# Patient Record
Sex: Male | Born: 1950
Health system: Southern US, Community
[De-identification: ages and names within clinical notes are randomized; demographics above are authoritative.]

## PROBLEM LIST (undated history)

## (undated) DIAGNOSIS — M1990 Unspecified osteoarthritis, unspecified site: Secondary | ICD-10-CM

## (undated) DIAGNOSIS — J309 Allergic rhinitis, unspecified: Secondary | ICD-10-CM

## (undated) DIAGNOSIS — R0602 Shortness of breath: Secondary | ICD-10-CM

## (undated) DIAGNOSIS — E785 Hyperlipidemia, unspecified: Secondary | ICD-10-CM

## (undated) DIAGNOSIS — R079 Chest pain, unspecified: Secondary | ICD-10-CM

## (undated) DIAGNOSIS — I10 Essential (primary) hypertension: Secondary | ICD-10-CM

## (undated) DIAGNOSIS — J189 Pneumonia, unspecified organism: Secondary | ICD-10-CM

## (undated) DIAGNOSIS — J449 Chronic obstructive pulmonary disease, unspecified: Secondary | ICD-10-CM

## (undated) DIAGNOSIS — N529 Male erectile dysfunction, unspecified: Secondary | ICD-10-CM

## (undated) DIAGNOSIS — D649 Anemia, unspecified: Secondary | ICD-10-CM

## (undated) HISTORY — DX: Shortness of breath: R06.02

## (undated) HISTORY — DX: Hyperlipidemia, unspecified: E78.5

## (undated) HISTORY — DX: Allergic rhinitis, unspecified: J30.9

## (undated) HISTORY — DX: Male erectile dysfunction, unspecified: N52.9

## (undated) HISTORY — DX: Chest pain, unspecified: R07.9

## (undated) HISTORY — DX: Essential (primary) hypertension: I10

---

## 1997-10-14 ENCOUNTER — Ambulatory Visit (HOSPITAL_COMMUNITY): Admission: RE | Admit: 1997-10-14 | Discharge: 1997-10-14 | Payer: Self-pay | Admitting: Family Medicine

## 1997-12-02 ENCOUNTER — Ambulatory Visit (HOSPITAL_COMMUNITY): Admission: RE | Admit: 1997-12-02 | Discharge: 1997-12-02 | Payer: Self-pay | Admitting: Family Medicine

## 2000-06-11 ENCOUNTER — Encounter: Payer: Self-pay | Admitting: Family Medicine

## 2000-06-11 ENCOUNTER — Ambulatory Visit (HOSPITAL_COMMUNITY): Admission: RE | Admit: 2000-06-11 | Discharge: 2000-06-11 | Payer: Self-pay | Admitting: Family Medicine

## 2013-08-30 ENCOUNTER — Ambulatory Visit: Payer: BC Managed Care – PPO

## 2013-08-30 ENCOUNTER — Ambulatory Visit (INDEPENDENT_AMBULATORY_CARE_PROVIDER_SITE_OTHER): Payer: BC Managed Care – PPO | Admitting: Internal Medicine

## 2013-08-30 VITALS — BP 130/86 | HR 74 | Temp 98.1°F | Resp 20 | Ht 74.0 in | Wt 208.2 lb

## 2013-08-30 DIAGNOSIS — R05 Cough: Secondary | ICD-10-CM

## 2013-08-30 DIAGNOSIS — R059 Cough, unspecified: Secondary | ICD-10-CM

## 2013-08-30 DIAGNOSIS — J209 Acute bronchitis, unspecified: Secondary | ICD-10-CM

## 2013-08-30 DIAGNOSIS — F17200 Nicotine dependence, unspecified, uncomplicated: Secondary | ICD-10-CM

## 2013-08-30 MED ORDER — ALBUTEROL SULFATE HFA 108 (90 BASE) MCG/ACT IN AERS: 2.0000 | INHALATION_SPRAY | Freq: Four times a day (QID) | RESPIRATORY_TRACT | Status: DC | PRN

## 2013-08-30 MED ORDER — PREDNISONE 50 MG PO TABS: ORAL_TABLET | ORAL | Status: AC

## 2013-08-30 MED ORDER — AMOXICILLIN-POT CLAVULANATE 875-125 MG PO TABS: 1.0000 | ORAL_TABLET | Freq: Two times a day (BID) | ORAL | Status: DC

## 2013-08-30 MED ORDER — ALBUTEROL SULFATE (2.5 MG/3ML) 0.083% IN NEBU
2.5000 mg | INHALATION_SOLUTION | Freq: Once | RESPIRATORY_TRACT | Status: AC
  Administered 2013-08-30: 2.5 mg via RESPIRATORY_TRACT

## 2013-08-30 MED ORDER — BENZONATATE 200 MG PO CAPS: 200.0000 mg | ORAL_CAPSULE | Freq: Two times a day (BID) | ORAL | Status: DC | PRN

## 2013-08-30 NOTE — Progress Notes (Signed)
   Subjective:    Patient ID: Kenneth Weber, male    DOB: [birth year], 63 y.o.   MRN: [id number]  HPI 63 year old man with cc of cough for the past 2 weeks. cough moderate in severity keeps him up at night.  Sputum yellow, no fever , no chest pain no shortness of breath but does have some wheezing.  Has been taking otc allergy med mucinex and delsym without relief.  Is a smoker but has cut down on the amount he is smoking since he has this cough.  Last chest xray was about 1 year ago. No fever no weight loss no night sweats, no one else at home is sick no recent travel. No hx cardiac dx asthma or emphysema does have htn on meds   Review of Systems  Constitutional: Negative for fever, chills, diaphoresis, activity change, appetite change and fatigue.  HENT: Positive for congestion. Negative for rhinorrhea, sinus pressure, sneezing, sore throat and trouble swallowing.   Respiratory: Positive for cough.   Cardiovascular: Negative for chest pain.  All other systems reviewed and are negative.      Objective:   Physical Exam  Nursing note and vitals reviewed. Constitutional: He is oriented to person, place, and time. He appears well-developed and well-nourished.  HENT:  Head: Normocephalic and atraumatic.  Left Ear: External ear normal.  Nose: Nose normal.  Mouth/Throat: Oropharynx is clear and moist.  Eyes: Conjunctivae and EOM are normal. Pupils are equal, round, and reactive to light.  Neck: Normal range of motion. Neck supple.  Cardiovascular: Normal rate, regular rhythm, normal heart sounds and intact distal pulses.   Pulmonary/Chest: Effort normal. No respiratory distress. He has wheezes. He has no rales. He exhibits no tenderness.  Coarse rhonchi  with scattered wheezes  Abdominal: Soft.  Musculoskeletal: Normal range of motion.  Neurological: He is alert and oriented to person, place, and time.  Skin: Skin is warm and dry.  Psychiatric: He has a normal mood and affect. His  behavior is normal. Judgment and thought content normal.   Sat is 94% on room air. After nebulized albuterol in the office re eval his lungs sounds are better and the wheezes are gone. His repeat o2 sat after the treatment is 92-93%. Will treat with steroids and bronchodilators.  [facility] reading (PRIMARY) by  Dr. Mindi JunkerGottlieb. Increased bronchial markings, with early emphysematous changes in the lungs, diaphragm flat..     Assessment & Plan:  Cough for 2 weeks in a smoking gentleman, no fever sat is 94 % on room air. Has  Rhonchi and scattered wheezes. Will eval chest xray and give bronchodilator neb treatment in the office. Plan antibiotics and bronchodilator inhaler, cough suppressant and short course of steroids. Will recommend dc smoking.

## 2013-08-30 NOTE — Patient Instructions (Signed)
Take meds as directed. Stop smoking. See the dr in followup. If your symptoms do not improve or worsen return to the offices for further evaluation

## 2013-09-01 NOTE — Addendum Note (Signed)
Addended by: Ethelda ChickSMITH, Tabathia Knoche M on: 09/01/2013 01:02 PM   Modules accepted: Level of Service

## 2013-12-08 ENCOUNTER — Ambulatory Visit (INDEPENDENT_AMBULATORY_CARE_PROVIDER_SITE_OTHER): Payer: BC Managed Care – PPO | Admitting: Family Medicine

## 2013-12-08 VITALS — BP 140/72 | HR 89 | Temp 98.1°F | Resp 20 | Ht 74.0 in | Wt 206.0 lb

## 2013-12-08 DIAGNOSIS — B029 Zoster without complications: Secondary | ICD-10-CM

## 2013-12-08 DIAGNOSIS — R21 Rash and other nonspecific skin eruption: Secondary | ICD-10-CM

## 2013-12-08 MED ORDER — VALACYCLOVIR HCL 1 G PO TABS: 1000.0000 mg | ORAL_TABLET | Freq: Three times a day (TID) | ORAL | Status: DC

## 2013-12-08 MED ORDER — OXYCODONE-ACETAMINOPHEN 5-325 MG PO TABS: 1.0000 | ORAL_TABLET | Freq: Three times a day (TID) | ORAL | Status: DC | PRN

## 2013-12-08 NOTE — Progress Notes (Signed)
Chief Complaint:  Chief Complaint  Patient presents with  . Herpes Zoster    under right breast.  rib pain on the right side.  pt stated that he just broke out with the rash today.     HPI: Kenneth Weber is a 63 y.o. male who is here for  Right sided rash  On his back since Tuesday. He had pain and thought he was exertinghismelf working at [employer]  He had right side flank pain, today rash showed up and is on right side of breat and on right sided ribs, blisters, He has sever pain and cannot sleep. No n/t  Past Medical History  Diagnosis Date  . Hypertension    No past surgical history on file. History   Social History  . Marital Status: Married    Spouse Name: N/A    Number of Children: N/A  . Years of Education: N/A   Social History Main Topics  . Smoking status: Former Smoker -- 1.50 packs/day for 40 years    Types: Cigarettes    Quit date: 09/13/2013  . Smokeless tobacco: Never Used  . Alcohol Use: No  . Drug Use: No  . Sexual Activity: None   Other Topics Concern  . None   Social History Narrative  . None   No family history on file. Allergies  Allergen Reactions  . Streptogramins    Prior to Admission medications   Medication Sig Start Date End Date Taking? Authorizing Provider  amLODipine (NORVASC) 10 MG tablet Take 10 mg by mouth daily.   Yes Historical Provider, MD  aspirin 81 MG tablet Take 81 mg by mouth daily.   Yes Historical Provider, MD  irbesartan-hydrochlorothiazide (AVALIDE) 300-12.5 MG per tablet Take 1 tablet by mouth daily.   Yes Historical Provider, MD  Omega-3 Fatty Acids (FISH OIL) 1000 MG CAPS Take 2 capsules by mouth daily.   Yes Historical Provider, MD  albuterol (PROVENTIL HFA;VENTOLIN HFA) 108 (90 BASE) MCG/ACT inhaler Inhale 2 puffs into the lungs every 6 (six) hours as needed for wheezing or shortness of breath. 08/30/13   Sherlyn Hay, MD     ROS: The patient denies fevers, chills, night sweats, unintentional  weight loss, chest pain, palpitations, wheezing, dyspnea on exertion, nausea, vomiting, abdominal pain, dysuria, hematuria, melena, numbness, weakness, or tingling. +pain  All other systems have been reviewed and were otherwise negative with the exception of those mentioned in the HPI and as above.    PHYSICAL EXAM: Filed Vitals:   12/08/13 2039  BP: 140/72  Pulse: 89  Temp: 98.1 F (36.7 C)  Resp: 20   Filed Vitals:   12/08/13 2039  Height: 6\' 2"  (1.88 m)  Weight: 206 lb (93.441 kg)   Body mass index is 26.44 kg/(m^2).  General: Alert, no acute distress HEENT:  Normocephalic, atraumatic, oropharynx patent. EOMI, PERRLA, fundoscopuic exam nl Cardiovascular:  Regular rate and rhythm, no rubs murmurs or gallops.  No Carotid bruits, radial pulse intact. No pedal edema.  Respiratory: Clear to auscultation bilaterally.  Minimal exp wheezes,  NO rales, or rhonchi.  No cyanosis, no use of accessory musculature GI: No organomegaly, abdomen is soft and non-tender, positive bowel sounds.  No masses. Skin: + right sided flank blistering , vesicular rash Neurologic: Facial musculature symmetric. Psychiatric: Patient is appropriate throughout our interaction. Lymphatic: No cervical lymphadenopathy Musculoskeletal: Gait intact.   LABS: No results found for this or any previous visit.   EKG/XRAY:  Primary read interpreted by Dr. Conley RollsLe at [facility].   ASSESSMENT/PLAN: Encounter Diagnoses  Name Primary?  . Shingles Yes  . Rash and nonspecific skin eruption    Rx Percocet Rx Valtrex He may need gabapentin if he has post herpetic neuraglia F/uy   Gross sideeffects, risk and benefits, and alternatives of medications d/w patient. Patient is aware that all medications have potential sideeffects and we are unable to predict every sideeffect or drug-drug interaction that may occur.  Ryland Tungate PHUONG, DO 12/08/2013 10:01 PM

## 2013-12-08 NOTE — Patient Instructions (Signed)

## 2013-12-10 ENCOUNTER — Telehealth: Payer: Self-pay

## 2013-12-10 NOTE — Telephone Encounter (Signed)
Wife, Arline AspCindy called reguesting lyricia for her husband, recommended by a church member for pain.  Patient is unable to take the oxyCODONE-acetaminophen (ROXICET) 5-325 MG per tablet As he becomes sick for it.    [facility] [address]    [phone]

## 2013-12-10 NOTE — Telephone Encounter (Signed)
Please advise  Pt took advil, for work did not help  He can not take roxicet at work - causes dizziness He is able to take at night however This medication made him sick once due to not eating prior to dose Is there anything else he can use for the pain

## 2013-12-11 MED ORDER — GABAPENTIN 300 MG PO CAPS: 300.0000 mg | ORAL_CAPSULE | Freq: Every day | ORAL | Status: DC

## 2013-12-11 NOTE — Telephone Encounter (Signed)
Pt's wife notified.

## 2013-12-11 NOTE — Telephone Encounter (Signed)
rx for gabapentin sent to pharmacy

## 2014-03-12 ENCOUNTER — Other Ambulatory Visit: Payer: Self-pay | Admitting: Internal Medicine

## 2014-09-22 ENCOUNTER — Encounter: Payer: BLUE CROSS/BLUE SHIELD | Admitting: Physician Assistant

## 2014-09-22 ENCOUNTER — Other Ambulatory Visit: Payer: Self-pay | Admitting: Cardiology

## 2014-09-22 ENCOUNTER — Encounter (INDEPENDENT_AMBULATORY_CARE_PROVIDER_SITE_OTHER): Payer: BLUE CROSS/BLUE SHIELD

## 2014-09-22 ENCOUNTER — Encounter: Payer: Self-pay | Admitting: Physician Assistant

## 2014-09-22 DIAGNOSIS — R079 Chest pain, unspecified: Secondary | ICD-10-CM

## 2014-09-22 LAB — EXERCISE TOLERANCE TEST
CHL CUP MPHR: 158 {beats}/min
CHL CUP STRESS STAGE 1 GRADE: 0 %
CHL CUP STRESS STAGE 1 HR: 85 {beats}/min
CHL CUP STRESS STAGE 1 SBP: 113 mmHg
CHL CUP STRESS STAGE 2 GRADE: 0 %
CHL CUP STRESS STAGE 3 GRADE: 0.1 %
CHL CUP STRESS STAGE 4 GRADE: 10 %
CHL CUP STRESS STAGE 4 HR: 106 {beats}/min
CHL CUP STRESS STAGE 4 SBP: 148 mmHg
CHL CUP STRESS STAGE 4 SPEED: 1.7 mph
CHL CUP STRESS STAGE 5 GRADE: 12 %
CHL CUP STRESS STAGE 6 DBP: 83 mmHg
CHL CUP STRESS STAGE 6 GRADE: 14 %
CHL CUP STRESS STAGE 6 SPEED: 3.4 mph
CHL CUP STRESS STAGE 7 GRADE: 16 %
CHL CUP STRESS STAGE 7 HR: 139 {beats}/min
CHL CUP STRESS STAGE 8 DBP: 59 mmHg
CHL CUP STRESS STAGE 8 GRADE: 0 %
CHL CUP STRESS STAGE 8 SBP: 138 mmHg
CHL CUP STRESS STAGE 8 SPEED: 0 mph
CHL CUP STRESS STAGE 9 GRADE: 0 %
CHL CUP STRESS STAGE 9 HR: 93 {beats}/min
CHL RATE OF PERCEIVED EXERTION: 15
CSEPEW: 11.7 METS
CSEPHR: 87 %
CSEPPMHR: 87 %
Exercise duration (min): 10 min
Peak HR: 139 {beats}/min
Rest HR: 70 {beats}/min
Stage 1 DBP: 80 mmHg
Stage 1 Speed: 0 mph
Stage 2 HR: 93 {beats}/min
Stage 2 Speed: 1 mph
Stage 3 HR: 94 {beats}/min
Stage 3 Speed: 1 mph
Stage 4 DBP: 77 mmHg
Stage 5 DBP: 82 mmHg
Stage 5 HR: 117 {beats}/min
Stage 5 SBP: 165 mmHg
Stage 5 Speed: 2.5 mph
Stage 6 HR: 131 {beats}/min
Stage 6 SBP: 202 mmHg
Stage 7 Speed: 4.2 mph
Stage 8 HR: 127 {beats}/min
Stage 9 DBP: 71 mmHg
Stage 9 SBP: 154 mmHg
Stage 9 Speed: 0 mph

## 2015-06-21 ENCOUNTER — Other Ambulatory Visit: Payer: Self-pay | Admitting: Family Medicine

## 2015-06-21 ENCOUNTER — Ambulatory Visit
Admission: RE | Admit: 2015-06-21 | Discharge: 2015-06-21 | Disposition: A | Discharge status: Home / Self Care | Payer: 59 | Source: Ambulatory Visit | Attending: Family Medicine | Admitting: Family Medicine

## 2015-06-21 DIAGNOSIS — M542 Cervicalgia: Secondary | ICD-10-CM

## 2015-06-21 DIAGNOSIS — R05 Cough: Secondary | ICD-10-CM

## 2015-06-21 DIAGNOSIS — R059 Cough, unspecified: Secondary | ICD-10-CM

## 2017-07-29 DIAGNOSIS — I10 Essential (primary) hypertension: Secondary | ICD-10-CM | POA: Diagnosis not present

## 2017-07-29 DIAGNOSIS — N529 Male erectile dysfunction, unspecified: Secondary | ICD-10-CM | POA: Diagnosis not present

## 2017-07-29 DIAGNOSIS — Z0000 Encounter for general adult medical examination without abnormal findings: Secondary | ICD-10-CM | POA: Diagnosis not present

## 2017-07-29 DIAGNOSIS — E782 Mixed hyperlipidemia: Secondary | ICD-10-CM | POA: Diagnosis not present

## 2017-07-29 DIAGNOSIS — Z1389 Encounter for screening for other disorder: Secondary | ICD-10-CM | POA: Diagnosis not present

## 2017-07-29 DIAGNOSIS — Z1159 Encounter for screening for other viral diseases: Secondary | ICD-10-CM | POA: Diagnosis not present

## 2017-09-16 DIAGNOSIS — I10 Essential (primary) hypertension: Secondary | ICD-10-CM | POA: Diagnosis not present

## 2018-02-12 DIAGNOSIS — E782 Mixed hyperlipidemia: Secondary | ICD-10-CM | POA: Diagnosis not present

## 2018-02-12 DIAGNOSIS — N529 Male erectile dysfunction, unspecified: Secondary | ICD-10-CM | POA: Diagnosis not present

## 2018-02-12 DIAGNOSIS — I10 Essential (primary) hypertension: Secondary | ICD-10-CM | POA: Diagnosis not present

## 2018-04-27 DIAGNOSIS — R062 Wheezing: Secondary | ICD-10-CM | POA: Diagnosis not present

## 2018-04-27 DIAGNOSIS — J209 Acute bronchitis, unspecified: Secondary | ICD-10-CM | POA: Diagnosis not present

## 2018-05-22 DIAGNOSIS — S161XXA Strain of muscle, fascia and tendon at neck level, initial encounter: Secondary | ICD-10-CM | POA: Diagnosis not present

## 2018-06-08 DIAGNOSIS — M9902 Segmental and somatic dysfunction of thoracic region: Secondary | ICD-10-CM | POA: Diagnosis not present

## 2018-06-08 DIAGNOSIS — M9901 Segmental and somatic dysfunction of cervical region: Secondary | ICD-10-CM | POA: Diagnosis not present

## 2018-06-08 DIAGNOSIS — M40292 Other kyphosis, cervical region: Secondary | ICD-10-CM | POA: Diagnosis not present

## 2018-06-08 DIAGNOSIS — R293 Abnormal posture: Secondary | ICD-10-CM | POA: Diagnosis not present

## 2018-06-11 DIAGNOSIS — M9901 Segmental and somatic dysfunction of cervical region: Secondary | ICD-10-CM | POA: Diagnosis not present

## 2018-06-11 DIAGNOSIS — M9902 Segmental and somatic dysfunction of thoracic region: Secondary | ICD-10-CM | POA: Diagnosis not present

## 2018-06-11 DIAGNOSIS — R293 Abnormal posture: Secondary | ICD-10-CM | POA: Diagnosis not present

## 2018-06-11 DIAGNOSIS — M40292 Other kyphosis, cervical region: Secondary | ICD-10-CM | POA: Diagnosis not present

## 2018-06-15 DIAGNOSIS — R293 Abnormal posture: Secondary | ICD-10-CM | POA: Diagnosis not present

## 2018-06-15 DIAGNOSIS — M9901 Segmental and somatic dysfunction of cervical region: Secondary | ICD-10-CM | POA: Diagnosis not present

## 2018-06-15 DIAGNOSIS — M9902 Segmental and somatic dysfunction of thoracic region: Secondary | ICD-10-CM | POA: Diagnosis not present

## 2018-06-15 DIAGNOSIS — M40292 Other kyphosis, cervical region: Secondary | ICD-10-CM | POA: Diagnosis not present

## 2018-06-16 DIAGNOSIS — M40292 Other kyphosis, cervical region: Secondary | ICD-10-CM | POA: Diagnosis not present

## 2018-06-16 DIAGNOSIS — R293 Abnormal posture: Secondary | ICD-10-CM | POA: Diagnosis not present

## 2018-06-16 DIAGNOSIS — M9902 Segmental and somatic dysfunction of thoracic region: Secondary | ICD-10-CM | POA: Diagnosis not present

## 2018-06-16 DIAGNOSIS — M9901 Segmental and somatic dysfunction of cervical region: Secondary | ICD-10-CM | POA: Diagnosis not present

## 2018-06-22 DIAGNOSIS — M40292 Other kyphosis, cervical region: Secondary | ICD-10-CM | POA: Diagnosis not present

## 2018-06-22 DIAGNOSIS — M9901 Segmental and somatic dysfunction of cervical region: Secondary | ICD-10-CM | POA: Diagnosis not present

## 2018-06-22 DIAGNOSIS — R293 Abnormal posture: Secondary | ICD-10-CM | POA: Diagnosis not present

## 2018-06-22 DIAGNOSIS — M9902 Segmental and somatic dysfunction of thoracic region: Secondary | ICD-10-CM | POA: Diagnosis not present

## 2018-06-25 DIAGNOSIS — M9902 Segmental and somatic dysfunction of thoracic region: Secondary | ICD-10-CM | POA: Diagnosis not present

## 2018-06-25 DIAGNOSIS — M9901 Segmental and somatic dysfunction of cervical region: Secondary | ICD-10-CM | POA: Diagnosis not present

## 2018-06-25 DIAGNOSIS — R293 Abnormal posture: Secondary | ICD-10-CM | POA: Diagnosis not present

## 2018-06-25 DIAGNOSIS — M40292 Other kyphosis, cervical region: Secondary | ICD-10-CM | POA: Diagnosis not present

## 2018-06-30 DIAGNOSIS — M9902 Segmental and somatic dysfunction of thoracic region: Secondary | ICD-10-CM | POA: Diagnosis not present

## 2018-06-30 DIAGNOSIS — R293 Abnormal posture: Secondary | ICD-10-CM | POA: Diagnosis not present

## 2018-06-30 DIAGNOSIS — M9901 Segmental and somatic dysfunction of cervical region: Secondary | ICD-10-CM | POA: Diagnosis not present

## 2018-06-30 DIAGNOSIS — M40292 Other kyphosis, cervical region: Secondary | ICD-10-CM | POA: Diagnosis not present

## 2018-07-02 DIAGNOSIS — R293 Abnormal posture: Secondary | ICD-10-CM | POA: Diagnosis not present

## 2018-07-02 DIAGNOSIS — M9902 Segmental and somatic dysfunction of thoracic region: Secondary | ICD-10-CM | POA: Diagnosis not present

## 2018-07-02 DIAGNOSIS — M9901 Segmental and somatic dysfunction of cervical region: Secondary | ICD-10-CM | POA: Diagnosis not present

## 2018-07-02 DIAGNOSIS — M40292 Other kyphosis, cervical region: Secondary | ICD-10-CM | POA: Diagnosis not present

## 2018-07-06 DIAGNOSIS — M40292 Other kyphosis, cervical region: Secondary | ICD-10-CM | POA: Diagnosis not present

## 2018-07-06 DIAGNOSIS — M9902 Segmental and somatic dysfunction of thoracic region: Secondary | ICD-10-CM | POA: Diagnosis not present

## 2018-07-06 DIAGNOSIS — M9901 Segmental and somatic dysfunction of cervical region: Secondary | ICD-10-CM | POA: Diagnosis not present

## 2018-07-06 DIAGNOSIS — R293 Abnormal posture: Secondary | ICD-10-CM | POA: Diagnosis not present

## 2018-07-09 DIAGNOSIS — M9901 Segmental and somatic dysfunction of cervical region: Secondary | ICD-10-CM | POA: Diagnosis not present

## 2018-07-09 DIAGNOSIS — M9902 Segmental and somatic dysfunction of thoracic region: Secondary | ICD-10-CM | POA: Diagnosis not present

## 2018-07-09 DIAGNOSIS — R293 Abnormal posture: Secondary | ICD-10-CM | POA: Diagnosis not present

## 2018-07-09 DIAGNOSIS — M40292 Other kyphosis, cervical region: Secondary | ICD-10-CM | POA: Diagnosis not present

## 2018-07-13 DIAGNOSIS — M9902 Segmental and somatic dysfunction of thoracic region: Secondary | ICD-10-CM | POA: Diagnosis not present

## 2018-07-13 DIAGNOSIS — M40292 Other kyphosis, cervical region: Secondary | ICD-10-CM | POA: Diagnosis not present

## 2018-07-13 DIAGNOSIS — R293 Abnormal posture: Secondary | ICD-10-CM | POA: Diagnosis not present

## 2018-07-13 DIAGNOSIS — M9901 Segmental and somatic dysfunction of cervical region: Secondary | ICD-10-CM | POA: Diagnosis not present

## 2018-07-15 DIAGNOSIS — H401130 Primary open-angle glaucoma, bilateral, stage unspecified: Secondary | ICD-10-CM | POA: Diagnosis not present

## 2018-07-16 DIAGNOSIS — M9902 Segmental and somatic dysfunction of thoracic region: Secondary | ICD-10-CM | POA: Diagnosis not present

## 2018-07-16 DIAGNOSIS — M40292 Other kyphosis, cervical region: Secondary | ICD-10-CM | POA: Diagnosis not present

## 2018-07-16 DIAGNOSIS — R293 Abnormal posture: Secondary | ICD-10-CM | POA: Diagnosis not present

## 2018-07-16 DIAGNOSIS — M9901 Segmental and somatic dysfunction of cervical region: Secondary | ICD-10-CM | POA: Diagnosis not present

## 2018-07-22 DIAGNOSIS — M9902 Segmental and somatic dysfunction of thoracic region: Secondary | ICD-10-CM | POA: Diagnosis not present

## 2018-07-22 DIAGNOSIS — M9901 Segmental and somatic dysfunction of cervical region: Secondary | ICD-10-CM | POA: Diagnosis not present

## 2018-07-22 DIAGNOSIS — M40292 Other kyphosis, cervical region: Secondary | ICD-10-CM | POA: Diagnosis not present

## 2018-07-22 DIAGNOSIS — R293 Abnormal posture: Secondary | ICD-10-CM | POA: Diagnosis not present

## 2018-07-24 DIAGNOSIS — M40292 Other kyphosis, cervical region: Secondary | ICD-10-CM | POA: Diagnosis not present

## 2018-07-24 DIAGNOSIS — M9901 Segmental and somatic dysfunction of cervical region: Secondary | ICD-10-CM | POA: Diagnosis not present

## 2018-07-24 DIAGNOSIS — R293 Abnormal posture: Secondary | ICD-10-CM | POA: Diagnosis not present

## 2018-07-24 DIAGNOSIS — M9902 Segmental and somatic dysfunction of thoracic region: Secondary | ICD-10-CM | POA: Diagnosis not present

## 2018-07-29 DIAGNOSIS — R293 Abnormal posture: Secondary | ICD-10-CM | POA: Diagnosis not present

## 2018-07-29 DIAGNOSIS — M9901 Segmental and somatic dysfunction of cervical region: Secondary | ICD-10-CM | POA: Diagnosis not present

## 2018-07-29 DIAGNOSIS — M9902 Segmental and somatic dysfunction of thoracic region: Secondary | ICD-10-CM | POA: Diagnosis not present

## 2018-07-29 DIAGNOSIS — M40292 Other kyphosis, cervical region: Secondary | ICD-10-CM | POA: Diagnosis not present

## 2018-08-05 DIAGNOSIS — M9902 Segmental and somatic dysfunction of thoracic region: Secondary | ICD-10-CM | POA: Diagnosis not present

## 2018-08-05 DIAGNOSIS — R293 Abnormal posture: Secondary | ICD-10-CM | POA: Diagnosis not present

## 2018-08-05 DIAGNOSIS — M9901 Segmental and somatic dysfunction of cervical region: Secondary | ICD-10-CM | POA: Diagnosis not present

## 2018-08-05 DIAGNOSIS — M40292 Other kyphosis, cervical region: Secondary | ICD-10-CM | POA: Diagnosis not present

## 2018-08-06 DIAGNOSIS — E782 Mixed hyperlipidemia: Secondary | ICD-10-CM | POA: Diagnosis not present

## 2018-08-06 DIAGNOSIS — M25512 Pain in left shoulder: Secondary | ICD-10-CM | POA: Diagnosis not present

## 2018-08-06 DIAGNOSIS — I10 Essential (primary) hypertension: Secondary | ICD-10-CM | POA: Diagnosis not present

## 2018-08-06 DIAGNOSIS — N529 Male erectile dysfunction, unspecified: Secondary | ICD-10-CM | POA: Diagnosis not present

## 2018-08-06 DIAGNOSIS — Z0000 Encounter for general adult medical examination without abnormal findings: Secondary | ICD-10-CM | POA: Diagnosis not present

## 2018-08-10 DIAGNOSIS — I10 Essential (primary) hypertension: Secondary | ICD-10-CM | POA: Diagnosis not present

## 2018-08-10 DIAGNOSIS — E782 Mixed hyperlipidemia: Secondary | ICD-10-CM | POA: Diagnosis not present

## 2018-08-10 DIAGNOSIS — Z1211 Encounter for screening for malignant neoplasm of colon: Secondary | ICD-10-CM | POA: Diagnosis not present

## 2018-08-10 DIAGNOSIS — Z125 Encounter for screening for malignant neoplasm of prostate: Secondary | ICD-10-CM | POA: Diagnosis not present

## 2018-08-12 DIAGNOSIS — M40292 Other kyphosis, cervical region: Secondary | ICD-10-CM | POA: Diagnosis not present

## 2018-08-12 DIAGNOSIS — R293 Abnormal posture: Secondary | ICD-10-CM | POA: Diagnosis not present

## 2018-08-12 DIAGNOSIS — M9901 Segmental and somatic dysfunction of cervical region: Secondary | ICD-10-CM | POA: Diagnosis not present

## 2018-08-12 DIAGNOSIS — M9902 Segmental and somatic dysfunction of thoracic region: Secondary | ICD-10-CM | POA: Diagnosis not present

## 2018-08-19 DIAGNOSIS — M9901 Segmental and somatic dysfunction of cervical region: Secondary | ICD-10-CM | POA: Diagnosis not present

## 2018-08-19 DIAGNOSIS — M9902 Segmental and somatic dysfunction of thoracic region: Secondary | ICD-10-CM | POA: Diagnosis not present

## 2018-08-19 DIAGNOSIS — R293 Abnormal posture: Secondary | ICD-10-CM | POA: Diagnosis not present

## 2018-08-19 DIAGNOSIS — M40292 Other kyphosis, cervical region: Secondary | ICD-10-CM | POA: Diagnosis not present

## 2018-08-26 DIAGNOSIS — M9901 Segmental and somatic dysfunction of cervical region: Secondary | ICD-10-CM | POA: Diagnosis not present

## 2018-08-26 DIAGNOSIS — M9902 Segmental and somatic dysfunction of thoracic region: Secondary | ICD-10-CM | POA: Diagnosis not present

## 2018-08-26 DIAGNOSIS — M40292 Other kyphosis, cervical region: Secondary | ICD-10-CM | POA: Diagnosis not present

## 2018-08-26 DIAGNOSIS — R293 Abnormal posture: Secondary | ICD-10-CM | POA: Diagnosis not present

## 2018-09-14 DIAGNOSIS — H2513 Age-related nuclear cataract, bilateral: Secondary | ICD-10-CM | POA: Diagnosis not present

## 2018-09-14 DIAGNOSIS — H401131 Primary open-angle glaucoma, bilateral, mild stage: Secondary | ICD-10-CM | POA: Diagnosis not present

## 2019-09-01 ENCOUNTER — Ambulatory Visit: Payer: Self-pay

## 2019-09-01 ENCOUNTER — Ambulatory Visit (INDEPENDENT_AMBULATORY_CARE_PROVIDER_SITE_OTHER): Payer: Medicare Other | Admitting: Orthopaedic Surgery

## 2019-09-01 ENCOUNTER — Other Ambulatory Visit: Payer: Self-pay

## 2019-09-01 DIAGNOSIS — M25511 Pain in right shoulder: Secondary | ICD-10-CM

## 2019-09-01 DIAGNOSIS — G8929 Other chronic pain: Secondary | ICD-10-CM | POA: Diagnosis not present

## 2019-09-01 DIAGNOSIS — M25512 Pain in left shoulder: Secondary | ICD-10-CM | POA: Diagnosis not present

## 2019-09-01 MED ORDER — LIDOCAINE HCL 1 % IJ SOLN
3.0000 mL | INTRAMUSCULAR | Status: AC | PRN
  Administered 2019-09-01: 3 mL

## 2019-09-01 MED ORDER — METHYLPREDNISOLONE ACETATE 40 MG/ML IJ SUSP
40.0000 mg | INTRAMUSCULAR | Status: AC | PRN
  Administered 2019-09-01: 40 mg via INTRA_ARTICULAR

## 2019-09-01 NOTE — Progress Notes (Signed)
Office Visit Note   Patient: Kenneth Weber           Date of Birth: [birth year]           MRN: [id number] Visit Date: 09/01/2019              Requested by: Maury Dus, MD [address] [place],  [place] [address] PCP: Maury Dus, MD   Assessment & Plan: Visit Diagnoses:  1. Chronic left shoulder pain   2. Chronic right shoulder pain     Plan: Based on the patient's clinical exam he is experiencing some impingement syndrome of both shoulders.  The left shoulder is more concerning for proximal biceps tendinitis and tendinosis.  I will have him try Voltaren gel in these areas.  I did provide a steroid injection of both shoulders in the subacromial outlet which she agreed with and tolerated well.  I explained the risk and benefits of steroid injections before providing these.  All question concerns were answered and addressed.  I would like to reevaluate him in 4 weeks.  He may be someone that we would send up to Dr. Junius Roads for an ultrasound evaluation of the shoulders and proximal biceps tendon if it comes to that.  Follow-Up Instructions: Return in about 4 weeks (around 09/29/2019).   Orders:  Orders Placed This Encounter  Procedures  . Large Joint Inj: R subacromial bursa  . Large Joint Inj: L subacromial bursa  . XR Shoulder Left  . XR Shoulder Right   No orders of the defined types were placed in this encounter.     Procedures: Large Joint Inj: R subacromial bursa on 09/01/2019 11:24 AM Indications: pain and diagnostic evaluation Details: 22 G 1.5 in needle  Arthrogram: No  Medications: 3 mL lidocaine 1 %; 40 mg methylPREDNISolone acetate 40 MG/ML Outcome: tolerated well, no immediate complications Procedure, treatment alternatives, risks and benefits explained, specific risks discussed. Consent was given by the patient. Immediately prior to procedure a time out was called to verify the correct patient, procedure, equipment, support staff and site/side  marked as required. Patient was prepped and draped in the usual sterile fashion.   Large Joint Inj: L subacromial bursa on 09/01/2019 11:24 AM Indications: pain and diagnostic evaluation Details: 22 G 1.5 in needle  Arthrogram: No  Medications: 3 mL lidocaine 1 %; 40 mg methylPREDNISolone acetate 40 MG/ML Outcome: tolerated well, no immediate complications Procedure, treatment alternatives, risks and benefits explained, specific risks discussed. Consent was given by the patient. Immediately prior to procedure a time out was called to verify the correct patient, procedure, equipment, support staff and site/side marked as required. Patient was prepped and draped in the usual sterile fashion.       Clinical Data: No additional findings.   Subjective: Chief Complaint  Patient presents with  . Left Shoulder - Pain  . Right Shoulder - Pain  The patient is a very pleasant and active 69 year old gentleman who comes in with bilateral shoulder pain.  He is left-hand dominant.  He is doing a lot of overhead activities and work for many years now.  He is never had surgery on her shoulders.  He recently has been taking some naproxen by his primary care physician and that is helped somewhat.  He reports decreased strength and motion of both shoulders.  He says the pain does wake him up at night.  He had neck pain at one point but chiropractic treatment help this greatly.  He  denies any numbness and tingling in his hands.  He is not a diabetic.  He denies any neck pain today.  HPI  Review of Systems He currently denies any headache, chest pain, shortness of breath, fever, chills, nausea, vomiting  Objective: Vital Signs: There were no vitals taken for this visit.  Physical Exam He is alert and orient x3 and in no acute distress Ortho Exam Examination of both shoulder shows he actually has full and fluid range of motion of both shoulders.  On the left side he does have significant pain at the  proximal biceps tendon.  He has 5 out of 5 strength of the rotator cuff and negative liftoff.  He is surprisingly does not have any significant positive Neer Hawkins signs either. Specialty Comments:  No specialty comments available.  Imaging: XR Shoulder Left  Result Date: 09/01/2019 3 views of the left shoulder show no acute findings.  The shoulder is well located.  There is significant AC joint arthritic changes.  XR Shoulder Right  Result Date: 09/01/2019 3 views of the right shoulder show no acute findings.  The shoulder is well located.  There is significant AC joint arthritic changes.    PMFS History: There are no problems to display for this patient.  Past Medical History:  Diagnosis Date  . Allergic rhinitis   . Chest pain   . ED (erectile dysfunction)   . Hyperlipidemia   . Hypertension   . SOB (shortness of breath)     No family history on file.  No past surgical history on file. Social History   Occupational History  . Not on file  Tobacco Use  . Smoking status: Former Smoker    Packs/day: 1.50    Years: 40.00    Pack years: 60.00    Types: Cigarettes    Quit date: 09/13/2013    Years since quitting: 5.9  . Smokeless tobacco: Never Used  Substance and Sexual Activity  . Alcohol use: No  . Drug use: No  . Sexual activity: Not on file

## 2019-09-29 ENCOUNTER — Other Ambulatory Visit: Payer: Self-pay

## 2019-09-29 ENCOUNTER — Ambulatory Visit (INDEPENDENT_AMBULATORY_CARE_PROVIDER_SITE_OTHER): Payer: Medicare Other | Admitting: Orthopaedic Surgery

## 2019-09-29 ENCOUNTER — Encounter: Payer: Self-pay | Admitting: Orthopaedic Surgery

## 2019-09-29 DIAGNOSIS — M7542 Impingement syndrome of left shoulder: Secondary | ICD-10-CM

## 2019-09-29 DIAGNOSIS — M7541 Impingement syndrome of right shoulder: Secondary | ICD-10-CM | POA: Diagnosis not present

## 2019-09-29 NOTE — Progress Notes (Signed)
   Office Visit Note   Patient: Kenneth Weber           Date of Birth: August 21, 1950           MRN: [id number] Visit Date: 09/29/2019              Requested by: [name], MD [phone] [address] [place],  [place] [address] PCP: [name], MD   Assessment & Plan: Visit Diagnoses:  1. Impingement syndrome of right shoulder   2. Impingement syndrome of left shoulder     Plan: He is given Thera-Band and shown exercises.  He will continue to do the exercises shown by his chiropractor.  We will have him follow-up with [place] on as-needed basis pain returns.  Questions were encouraged and answered.  Follow-Up Instructions: Return if symptoms worsen or fail to improve.   Orders:  No orders of the defined types were placed in this encounter.  No orders of the defined types were placed in this encounter.     Procedures: No procedures performed   Clinical Data: No additional findings.   Subjective: Chief Complaint  Patient presents with  . Left Shoulder - Follow-up    HPI [name] returns today 4 weeks status post bilateral shoulder subacromial injections.  He states he is overall doing great.  States his range of motion is increased.  He is having no pain in his shoulders no waking pain in the shoulders as he was having prior to the injections. Review of Systems See HPI otherwise negative  Objective: Vital Signs: There were no vitals taken for this visit.  Physical Exam Constitutional:      Appearance: He is not ill-appearing or diaphoretic.  Pulmonary:     Effort: Pulmonary effort is normal.  Neurological:     Mental Status: He is alert and oriented to person, place, and time.  Psychiatric:        Behavior: Behavior normal.     Ortho Exam Bilateral shoulders full forward flexion.  Negative impingement testing bilaterally.  Empty can test is negative bilaterally.  5 out of 5 strength with external/internal rotation against resistance. Specialty  Comments:  No specialty comments available.  Imaging: No results found.   PMFS History: There are no problems to display for this patient.  Past Medical History:  Diagnosis Date  . Allergic rhinitis   . Chest pain   . ED (erectile dysfunction)   . Hyperlipidemia   . Hypertension   . SOB (shortness of breath)     History reviewed. No pertinent family history.  History reviewed. No pertinent surgical history. Social History   Occupational History  . Not on file  Tobacco Use  . Smoking status: Former Smoker    Packs/day: 1.50    Years: 40.00    Pack years: 60.00    Types: Cigarettes    Quit date: 09/13/2013    Years since quitting: 6.0  . Smokeless tobacco: Never Used  Substance and Sexual Activity  . Alcohol use: No  . Drug use: No  . Sexual activity: Not on file

## 2020-04-12 ENCOUNTER — Ambulatory Visit: Payer: Medicare Other | Admitting: Orthopaedic Surgery

## 2020-04-12 DIAGNOSIS — M25512 Pain in left shoulder: Secondary | ICD-10-CM

## 2020-04-12 DIAGNOSIS — M25511 Pain in right shoulder: Secondary | ICD-10-CM

## 2020-04-12 DIAGNOSIS — G8929 Other chronic pain: Secondary | ICD-10-CM

## 2020-04-12 MED ORDER — LIDOCAINE HCL 1 % IJ SOLN
3.0000 mL | INTRAMUSCULAR | Status: AC | PRN
  Administered 2020-04-12: 3 mL

## 2020-04-12 MED ORDER — METHYLPREDNISOLONE ACETATE 40 MG/ML IJ SUSP
40.0000 mg | INTRAMUSCULAR | Status: AC | PRN
  Administered 2020-04-12: 40 mg via INTRA_ARTICULAR

## 2020-04-12 NOTE — Progress Notes (Signed)
Office Visit Note   Patient: Kenneth Weber           Date of Birth: [birth year]           MRN: [id number] Visit Date: 04/12/2020              Requested by: [name], MD [phone] [address] [place],  [place] [address] PCP: [name], MD   Assessment & Plan: Visit Diagnoses:  1. Chronic left shoulder pain   2. Chronic right shoulder pain     Plan: He is really interested in just having steroid injections in both shoulders today and I agree with this and placed injections in both subacromial outlet that he tolerated well.  My neck step would be to obtain an MRI of his shoulder if he continues to have significant enough pain but he said he is not interested in any type of surgical evaluation so I would potentially recommend physical therapy after this.  All question concerns were answered and addressed.  Follow-up can be as needed.  Follow-Up Instructions: Return if symptoms worsen or fail to improve.   Orders:  Orders Placed This Encounter  Procedures  . Large Joint Inj  . Large Joint Inj   No orders of the defined types were placed in this encounter.     Procedures: Large Joint Inj: R subacromial bursa on 04/12/2020 3:22 PM Indications: pain and diagnostic evaluation Details: 22 G 1.5 in needle  Arthrogram: No  Medications: 3 mL lidocaine 1 %; 40 mg methylPREDNISolone acetate 40 MG/ML Outcome: tolerated well, no immediate complications Procedure, treatment alternatives, risks and benefits explained, specific risks discussed. Consent was given by the patient. Immediately prior to procedure a time out was called to verify the correct patient, procedure, equipment, support staff and site/side marked as required. Patient was prepped and draped in the usual sterile fashion.   Large Joint Inj: L subacromial bursa on 04/12/2020 3:23 PM Indications: pain and diagnostic evaluation Details: 22 G 1.5 in needle  Arthrogram: No  Medications: 3 mL lidocaine 1 %; 40 mg  methylPREDNISolone acetate 40 MG/ML Outcome: tolerated well, no immediate complications Procedure, treatment alternatives, risks and benefits explained, specific risks discussed. Consent was given by the patient. Immediately prior to procedure a time out was called to verify the correct patient, procedure, equipment, support staff and site/side marked as required. Patient was prepped and draped in the usual sterile fashion.       Clinical Data: No additional findings.   Subjective: Chief Complaint  Patient presents with  . Left Shoulder - Pain  . Right Shoulder - Pain  The patient comes in today for evaluation treatment of bilateral shoulder pain.  He is a very active 69 year old gentleman.  He is left-hand dominant.  I seen him for his shoulders before and we diagnosed him with impingement syndrome.  We last injected his shoulders way back in April of this year.  He said they lasted until just recently.  He reports more pain with his left shoulder than his right shoulder.  He does a lot of overhead work.  He is not a diabetic.  He denies any acute change in his medical status.  HPI  Review of Systems He currently denies any headache, chest pain, shortness of breath, fever, chills, nausea, vomiting  Objective: Vital Signs: There were no vitals taken for this visit.  Physical Exam He is alert and orient x3 and in no acute distress Ortho Exam Examination of his shoulder  shows some limitations with internal rotation and abduction.  His right shoulder x-ray does show some deficiency of the rotator cuff with abduction using more of his deltoids abduct his shoulder.  His left shoulder moves more smoothly.  Both shoulder show signs of subacromial impingement.  He still seems to have great strength with both shoulders. Specialty Comments:  No specialty comments available.  Imaging: No results found.   PMFS History: There are no problems to display for this patient.  Past Medical  History:  Diagnosis Date  . Allergic rhinitis   . Chest pain   . ED (erectile dysfunction)   . Hyperlipidemia   . Hypertension   . SOB (shortness of breath)     No family history on file.  No past surgical history on file. Social History   Occupational History  . Not on file  Tobacco Use  . Smoking status: Former Smoker    Packs/day: 1.50    Years: 40.00    Pack years: 60.00    Types: Cigarettes    Quit date: 09/13/2013    Years since quitting: 6.5  . Smokeless tobacco: Never Used  Substance and Sexual Activity  . Alcohol use: No  . Drug use: No  . Sexual activity: Not on file

## 2020-05-31 DIAGNOSIS — R059 Cough, unspecified: Secondary | ICD-10-CM | POA: Diagnosis not present

## 2020-06-08 DIAGNOSIS — J208 Acute bronchitis due to other specified organisms: Secondary | ICD-10-CM | POA: Diagnosis not present

## 2020-06-08 DIAGNOSIS — R059 Cough, unspecified: Secondary | ICD-10-CM | POA: Diagnosis not present

## 2020-06-08 DIAGNOSIS — U071 COVID-19: Secondary | ICD-10-CM | POA: Diagnosis not present

## 2020-06-20 ENCOUNTER — Other Ambulatory Visit: Payer: Self-pay | Admitting: Family Medicine

## 2020-06-20 ENCOUNTER — Ambulatory Visit
Admission: RE | Admit: 2020-06-20 | Discharge: 2020-06-20 | Disposition: A | Discharge status: Home / Self Care | Payer: Medicare Other | Source: Ambulatory Visit | Attending: Family Medicine | Admitting: Family Medicine

## 2020-06-20 DIAGNOSIS — R062 Wheezing: Secondary | ICD-10-CM

## 2020-06-20 DIAGNOSIS — L309 Dermatitis, unspecified: Secondary | ICD-10-CM | POA: Diagnosis not present

## 2020-06-20 DIAGNOSIS — I952 Hypotension due to drugs: Secondary | ICD-10-CM | POA: Diagnosis not present

## 2020-07-10 DIAGNOSIS — L989 Disorder of the skin and subcutaneous tissue, unspecified: Secondary | ICD-10-CM | POA: Diagnosis not present

## 2020-07-10 DIAGNOSIS — R7303 Prediabetes: Secondary | ICD-10-CM | POA: Diagnosis not present

## 2020-07-10 DIAGNOSIS — M25512 Pain in left shoulder: Secondary | ICD-10-CM | POA: Diagnosis not present

## 2020-07-10 DIAGNOSIS — I10 Essential (primary) hypertension: Secondary | ICD-10-CM | POA: Diagnosis not present

## 2020-07-25 ENCOUNTER — Encounter: Payer: Self-pay | Admitting: Radiology

## 2020-07-25 NOTE — Progress Notes (Signed)
I spoke with patient.  He is having bilateral shoulder pain again and wants to know if you will do more cortisone injections.  His last note said MRI was next step, however patient not interested in surgery.  You mentioned PT next.  That was in Dec 2021.  Please let me know if ok to schedule and plan for cortisone injections.

## 2020-07-25 NOTE — Progress Notes (Signed)
Noted, will schedule at patient's request.

## 2020-07-25 NOTE — Progress Notes (Signed)
It will be fine to schedule again for injections.  Thanks

## 2020-08-03 DIAGNOSIS — Z01812 Encounter for preprocedural laboratory examination: Secondary | ICD-10-CM | POA: Diagnosis not present

## 2020-08-08 DIAGNOSIS — K5730 Diverticulosis of large intestine without perforation or abscess without bleeding: Secondary | ICD-10-CM | POA: Diagnosis not present

## 2020-08-08 DIAGNOSIS — K640 First degree hemorrhoids: Secondary | ICD-10-CM | POA: Diagnosis not present

## 2020-08-08 DIAGNOSIS — K635 Polyp of colon: Secondary | ICD-10-CM | POA: Diagnosis not present

## 2020-08-08 DIAGNOSIS — Z800 Family history of malignant neoplasm of digestive organs: Secondary | ICD-10-CM | POA: Diagnosis not present

## 2020-08-08 DIAGNOSIS — Z1211 Encounter for screening for malignant neoplasm of colon: Secondary | ICD-10-CM | POA: Diagnosis not present

## 2020-08-11 DIAGNOSIS — K635 Polyp of colon: Secondary | ICD-10-CM | POA: Diagnosis not present

## 2020-08-28 DIAGNOSIS — M720 Palmar fascial fibromatosis [Dupuytren]: Secondary | ICD-10-CM | POA: Diagnosis not present

## 2020-08-28 DIAGNOSIS — M79641 Pain in right hand: Secondary | ICD-10-CM | POA: Diagnosis not present

## 2020-08-28 DIAGNOSIS — E782 Mixed hyperlipidemia: Secondary | ICD-10-CM | POA: Diagnosis not present

## 2020-08-28 DIAGNOSIS — I10 Essential (primary) hypertension: Secondary | ICD-10-CM | POA: Diagnosis not present

## 2020-08-28 DIAGNOSIS — R7303 Prediabetes: Secondary | ICD-10-CM | POA: Diagnosis not present

## 2020-08-28 DIAGNOSIS — J410 Simple chronic bronchitis: Secondary | ICD-10-CM | POA: Diagnosis not present

## 2020-08-28 DIAGNOSIS — L989 Disorder of the skin and subcutaneous tissue, unspecified: Secondary | ICD-10-CM | POA: Diagnosis not present

## 2020-08-28 DIAGNOSIS — Z1389 Encounter for screening for other disorder: Secondary | ICD-10-CM | POA: Diagnosis not present

## 2020-08-28 DIAGNOSIS — E46 Unspecified protein-calorie malnutrition: Secondary | ICD-10-CM | POA: Diagnosis not present

## 2020-08-28 DIAGNOSIS — M25512 Pain in left shoulder: Secondary | ICD-10-CM | POA: Diagnosis not present

## 2020-08-28 DIAGNOSIS — Z0000 Encounter for general adult medical examination without abnormal findings: Secondary | ICD-10-CM | POA: Diagnosis not present

## 2020-08-29 ENCOUNTER — Encounter: Payer: Self-pay | Admitting: Internal Medicine

## 2020-09-19 DIAGNOSIS — N289 Disorder of kidney and ureter, unspecified: Secondary | ICD-10-CM | POA: Diagnosis not present

## 2020-10-10 ENCOUNTER — Other Ambulatory Visit: Payer: Self-pay

## 2020-10-10 ENCOUNTER — Encounter: Payer: Self-pay | Admitting: Internal Medicine

## 2020-10-10 ENCOUNTER — Ambulatory Visit: Payer: Medicare Other | Admitting: Internal Medicine

## 2020-10-10 VITALS — BP 104/62 | HR 66 | Ht 73.0 in | Wt 181.0 lb

## 2020-10-10 DIAGNOSIS — J449 Chronic obstructive pulmonary disease, unspecified: Secondary | ICD-10-CM | POA: Diagnosis not present

## 2020-10-10 DIAGNOSIS — R0600 Dyspnea, unspecified: Secondary | ICD-10-CM

## 2020-10-10 DIAGNOSIS — R0609 Other forms of dyspnea: Secondary | ICD-10-CM

## 2020-10-10 MED ORDER — BREZTRI AEROSPHERE 160-9-4.8 MCG/ACT IN AERO: 2.0000 | INHALATION_SPRAY | Freq: Two times a day (BID) | RESPIRATORY_TRACT | 11 refills | Status: DC

## 2020-10-10 MED ORDER — BREZTRI AEROSPHERE 160-9-4.8 MCG/ACT IN AERO: 2.0000 | INHALATION_SPRAY | Freq: Two times a day (BID) | RESPIRATORY_TRACT | 0 refills | Status: DC

## 2020-10-10 NOTE — Patient Instructions (Addendum)
I would prefer you not use Timoptic eyedrops and instead Betoptic   Plan A = Automatic = Always=   Breztri Take 2 puffs first thing in am and then another 2 puffs about 12 hours later - ok to reduce to one twice daily    Work on inhaler technique:  relax and gently blow all the way out then take a nice smooth full deep breath back in, triggering the inhaler at same time you start breathing in.  Hold for up to 5 seconds if you can. Blow out thru nose. Rinse and gargle with water when done.  If mouth or throat bother you at all,  try brushing teeth/gums/tongue with arm and hammer toothpaste/ make a slurry and gargle and spit out.      Plan B = Backup (to supplement plan A, not to replace it) Only use your albuterol inhaler as a rescue medication to be used if you can't catch your breath by resting or doing a relaxed purse lip breathing pattern.  - The less you use it, the better it will work when you need it. - Ok to use the inhaler up to 2 puffs  every 4 hours if you must but call for appointment if use goes up over your usual need - Don't leave home without it !!  (think of it like the spare tire or starter fluid for your car)    Please schedule a follow up office visit in 6 weeks, call sooner if needed with pfts on return

## 2020-10-10 NOTE — Progress Notes (Signed)
Kenneth Weber, male    DOB: [birth year]   MRN: [id number]   Brief patient profile:  70 yo male quit smoking 09/2013 with doe  Only sob uphill s need for inhalers vax x 2 then omicron Jan 2022 cough/sob and sick x 10 days other added x albuterol ? Helped but not back to baseline ex tol more due to fatigue than sob referred to pulmonary clinic in [place]  10/10/2020 by Dr   [name]    History of Present Illness  10/10/2020  Pulmonary/ 1st office eval/Marselino Slayton  Chief Complaint  Patient presents with  . Pulmonary Consult    Referred by Dr [name]. Pt states had covid 19 in Jan 2022-having SOB since then. He gets SOB when lifting things.   Dyspnea:  MMRC1 = can walk nl pace, flat grade, can't hurry or go uphills or steps s sob   Cough: none  Sleep: wheeze at night sometimes  SABA use: twice a week esp noct   No obvious patterns in day to day or daytime variability or assoc excess/ purulent sputum or mucus plugs or hemoptysis or cp or chest tightness,   or overt sinus or hb symptoms.      Also denies any obvious fluctuation of symptoms with weather or environmental changes or other aggravating or alleviating factors except as outlined above   No unusual exposure hx or h/o childhood pna/ asthma or knowledge of premature birth.  Current Allergies, Complete Past Medical History, Past Surgical History, Family History, and Social History were reviewed in [employer] record.  ROS  The following are not active complaints unless bolded Hoarseness, sore throat, dysphagia, dental problems, itching, sneezing,  nasal congestion or discharge of excess mucus or purulent secretions, ear ache,   fever, chills, sweats, unintended wt loss or wt gain, classically pleuritic or exertional cp,  orthopnea pnd or arm/hand swelling  or leg swelling, presyncope, palpitations, abdominal pain, anorexia, nausea, vomiting, diarrhea  or change in bowel habits or change in bladder habits, change in  stools or change in urine, dysuria, hematuria,  rash, arthralgias, visual complaints, headache, numbness, weakness or ataxia or problems with walking or coordination,  change in mood or  memory.               Past Medical History:  Diagnosis Date  . Allergic rhinitis   . Chest pain   . ED (erectile dysfunction)   . Hyperlipidemia   . Hypertension   . SOB (shortness of breath)     Outpatient Medications Prior to Visit  Medication Sig Dispense Refill  . acetaminophen (TYLENOL) 325 MG tablet Take 650 mg by mouth every 6 (six) hours as needed.    [name] albuterol (PROVENTIL HFA;VENTOLIN HFA) 108 (90 BASE) MCG/ACT inhaler Inhale 2 puffs into the lungs every 6 (six) hours as needed for wheezing or shortness of breath. 1 Inhaler 0  . amLODipine (NORVASC) 5 MG tablet Take 5 mg by mouth daily.    . Ascorbic Acid (VITAMIN C) 100 MG tablet Take 100 mg by mouth daily.    [name] 500 MG CAPS Take 1 tablet by mouth daily.    [name] latanoprost (XALATAN) 0.005 % ophthalmic solution     . olmesartan-hydrochlorothiazide (BENICAR HCT) 40-12.5 MG tablet SMARTSIG:.5 Tablet(s) By Mouth Every Morning    . Omega-3 Fatty Acids (FISH OIL) 1000 MG CAPS Take 2 capsules by mouth daily.    . timolol (TIMOPTIC) 0.5 % ophthalmic solution 1 drop every morning.    [name]  amLODipine (NORVASC) 10 MG tablet Take 10 mg by mouth daily.    [name] aspirin 81 MG tablet Take 81 mg by mouth daily.    [name] gabapentin (NEURONTIN) 300 MG capsule Take 1 capsule (300 mg total) by mouth daily. 90 capsule 0  . irbesartan-hydrochlorothiazide (AVALIDE) 300-12.5 MG per tablet Take 1 tablet by mouth daily.    . naproxen (NAPROSYN) 500 MG tablet Take 500 mg by mouth 2 (two) times daily with a meal.    . valACYclovir (VALTREX) 1000 MG tablet Take 1 tablet (1,000 mg total) by mouth 3 (three) times daily. 21 tablet 0   No facility-administered medications prior to visit.     Objective:     BP 104/62 (BP Location: Left Arm, Cuff Size: Normal)    Pulse 66   Ht 6\' 1"  (1.854 m)   Wt 181 lb (82.1 kg)   SpO2 96% Comment: on RA  BMI 23.88 kg/m   SpO2: 96 % (on RA)    HEENT : pt wearing mask not removed for exam due to covid - 19 concerns.    NECK :  without JVD/Nodes/TM/ nl carotid upstrokes bilaterally   LUNGS: no acc muscle use,  Mild barrel  contour chest wall with bilateral  Distant exp wheeze and  without cough on insp or exp maneuvers  and mild  Hyperresonant  to  percussion bilaterally     CV:  RRR  no s3 or murmur or increase in P2, and no edema   ABD:  soft and nontender with pos end  insp Hoover's  in the supine position. No bruits or organomegaly appreciated, bowel sounds nl  MS:   Nl gait/  ext warm without deformities, calf tenderness, cyanosis or clubbing No obvious joint restrictions   SKIN: warm and dry without lesions    NEURO:  alert, approp, nl sensorium with  no motor or cerebellar deficits apparent.        I personally reviewed images and agree with radiology impression as follows:  CXR:   Pa and lat 06/20/20 wnl     Assessment   No problem-specific Assessment & Plan notes found for this encounter.     06/22/20, MD 10/10/2020

## 2020-10-10 NOTE — Addendum Note (Signed)
Addended by: Sandrea Hughs B on: 10/10/2020 02:36 PM   Modules accepted: Level of Service

## 2020-10-10 NOTE — Assessment & Plan Note (Addendum)
Quit smoking 2015 - flared p covid 19 jan 22  - 10/10/2020  After extensive coaching inhaler device,  effectiveness =    90% > trial of breztri 2bid and prn saba  Suspect he may have had mild copd/AB prior to covid19 but hard to tell by hx so rec start with breztri 2bid and f/u with pft s breztri or saba prior and regroup then on what is needed longterm   >>> also note using timolol eyedrops but has had eyesurgery so presumably does not have closed angle dz and advised he notify his eye doctor of new med and ? Need to change to betoptic which does not have as spillover B2 effects systemically as timolol          Each maintenance medication was reviewed in detail including emphasizing most importantly the difference between maintenance and prns and under what circumstances the prns are to be triggered using an action plan format where appropriate.  Total time for H and P, chart review, counseling,  and generating customized AVS unique to this office visit / same day charting = 35 min

## 2020-11-28 ENCOUNTER — Ambulatory Visit: Payer: Medicare Other | Admitting: Internal Medicine

## 2020-11-28 ENCOUNTER — Other Ambulatory Visit (HOSPITAL_COMMUNITY)
Admission: RE | Admit: 2020-11-28 | Discharge: 2020-11-28 | Disposition: A | Discharge status: Home / Self Care | Payer: Medicare Other | Source: Ambulatory Visit | Attending: Internal Medicine | Admitting: Internal Medicine

## 2020-11-28 DIAGNOSIS — Z01812 Encounter for preprocedural laboratory examination: Secondary | ICD-10-CM | POA: Diagnosis not present

## 2020-11-28 DIAGNOSIS — Z20822 Contact with and (suspected) exposure to covid-19: Secondary | ICD-10-CM | POA: Diagnosis not present

## 2020-11-28 LAB — SARS CORONAVIRUS 2 (TAT 6-24 HRS): SARS Coronavirus 2: NEGATIVE

## 2020-11-29 ENCOUNTER — Encounter: Payer: Self-pay | Admitting: *Deleted

## 2020-12-01 ENCOUNTER — Ambulatory Visit: Payer: Medicare Other | Admitting: Internal Medicine

## 2020-12-01 ENCOUNTER — Encounter: Payer: Self-pay | Admitting: Internal Medicine

## 2020-12-01 ENCOUNTER — Other Ambulatory Visit: Payer: Self-pay

## 2020-12-01 ENCOUNTER — Ambulatory Visit (INDEPENDENT_AMBULATORY_CARE_PROVIDER_SITE_OTHER): Payer: Medicare Other | Admitting: Internal Medicine

## 2020-12-01 DIAGNOSIS — R0609 Other forms of dyspnea: Secondary | ICD-10-CM

## 2020-12-01 DIAGNOSIS — R0600 Dyspnea, unspecified: Secondary | ICD-10-CM | POA: Diagnosis not present

## 2020-12-01 DIAGNOSIS — J449 Chronic obstructive pulmonary disease, unspecified: Secondary | ICD-10-CM | POA: Diagnosis not present

## 2020-12-01 LAB — PULMONARY FUNCTION TEST
DL/VA % pred: 87 %
DL/VA: 3.52 ml/min/mmHg/L
DLCO cor % pred: 69 %
DLCO cor: 19.33 ml/min/mmHg
DLCO unc % pred: 69 %
DLCO unc: 19.33 ml/min/mmHg
FEF 25-75 Post: 1.07 L/sec
FEF 25-75 Pre: 0.57 L/sec
FEF2575-%Change-Post: 87 %
FEF2575-%Pred-Post: 39 %
FEF2575-%Pred-Pre: 21 %
FEV1-%Change-Post: 28 %
FEV1-%Pred-Post: 47 %
FEV1-%Pred-Pre: 36 %
FEV1-Post: 1.68 L
FEV1-Pre: 1.31 L
FEV1FVC-%Change-Post: 14 %
FEV1FVC-%Pred-Pre: 69 %
FEV6-%Change-Post: 14 %
FEV6-%Pred-Post: 61 %
FEV6-%Pred-Pre: 53 %
FEV6-Post: 2.8 L
FEV6-Pre: 2.44 L
FEV6FVC-%Change-Post: 1 %
FEV6FVC-%Pred-Post: 102 %
FEV6FVC-%Pred-Pre: 100 %
FVC-%Change-Post: 12 %
FVC-%Pred-Post: 59 %
FVC-%Pred-Pre: 53 %
FVC-Post: 2.88 L
FVC-Pre: 2.56 L
Post FEV1/FVC ratio: 58 %
Post FEV6/FVC ratio: 97 %
Pre FEV1/FVC ratio: 51 %
Pre FEV6/FVC Ratio: 95 %
RV % pred: 179 %
RV: 4.56 L
TLC % pred: 98 %
TLC: 7.28 L

## 2020-12-01 NOTE — Progress Notes (Signed)
PFT done today. 

## 2020-12-01 NOTE — Progress Notes (Signed)
Kenneth Weber, male    DOB: [birth year]   MRN: [id number]   Brief patient profile:  25   yow male quit smoking 09/2013 with doe  Only sob uphill s need for inhalers vax x 2 then omicron Jan 2022 cough/sob and sick x 10 days other added x albuterol ? Helped but not back to baseline ex tol more due to fatigue than sob referred to pulmonary clinic in [place]  10/10/2020 by Dr   [name]    History of Present Illness  10/10/2020  Pulmonary/ 1st office eval/[name]  Chief Complaint  Patient presents with   Pulmonary Consult    Referred by Dr [name]. Pt states had covid 19 in Jan 2022-having SOB since then. He gets SOB when lifting things.   Dyspnea:  MMRC1 = can walk nl pace, flat grade, can't hurry or go uphills or steps s sob   Cough: none  Sleep: wheeze at night sometimes  SABA use: twice a week esp noct   Rec I would prefer you not use Timoptic eyedrops and instead Betoptic  Plan A = Automatic = Always=   Breztri Take 2 puffs first thing in am and then another 2 puffs about 12 hours later - ok to reduce to one twice daily  Work on inhaler technique: Plan B = Backup (to supplement plan A, not to replace it) Only use your albuterol inhaler as rescue Please schedule a follow up office visit in 6 weeks, call sooner if needed with pfts on return    12/01/2020  f/u ov/Abigayl Hor re:  GOLD 3 copd maint on breztri until this am  Chief Complaint  Patient presents with   Follow-up    PFT review.    Dyspnea:  MMRC1 = can walk nl pace, flat grade, can't hurry or go uphills or steps s sob   Cough: none  Sleeping: no resp issues  SABA use: none 02: none  Covid status:   vax x one / omicron Jan 2022    No obvious day to day or daytime variability or assoc excess/ purulent sputum or mucus plugs or hemoptysis or cp or chest tightness, subjective wheeze or overt sinus or hb symptoms.   Sleeping  without nocturnal  or early am exacerbation  of respiratory  c/o's or need for noct saba. Also denies any  obvious fluctuation of symptoms with weather or environmental changes or other aggravating or alleviating factors except as outlined above   No unusual exposure hx or h/o childhood pna/ asthma or knowledge of premature birth.  Current Allergies, Complete Past Medical History, Past Surgical History, Family History, and Social History were reviewed in [employer] record.  ROS  The following are not active complaints unless bolded Hoarseness, sore throat, dysphagia, dental problems, itching, sneezing,  nasal congestion or discharge of excess mucus or purulent secretions, ear ache,   fever, chills, sweats, unintended wt loss or wt gain, classically pleuritic or exertional cp,  orthopnea pnd or arm/hand swelling  or leg swelling, presyncope, palpitations, abdominal pain, anorexia, nausea, vomiting, diarrhea  or change in bowel habits or change in bladder habits, change in stools or change in urine, dysuria, hematuria,  rash, arthralgias, visual complaints, headache, numbness, weakness or ataxia or problems with walking or coordination,  change in mood or  memory.        Current Meds  Medication Sig   acetaminophen (TYLENOL) 325 MG tablet Take 650 mg by mouth every 6 (six) hours as needed.  albuterol (PROVENTIL HFA;VENTOLIN HFA) 108 (90 BASE) MCG/ACT inhaler Inhale 2 puffs into the lungs every 6 (six) hours as needed for wheezing or shortness of breath.   amLODipine (NORVASC) 5 MG tablet Take 5 mg by mouth daily.   Ascorbic Acid (VITAMIN C) 100 MG tablet Take 100 mg by mouth daily.   Budeson-Glycopyrrol-Formoterol (BREZTRI AEROSPHERE) 160-9-4.8 MCG/ACT AERO Inhale 2 puffs into the lungs 2 (two) times daily.   Elderberry 500 MG CAPS Take 1 tablet by mouth daily.   latanoprost (XALATAN) 0.005 % ophthalmic solution    olmesartan-hydrochlorothiazide (BENICAR HCT) 40-12.5 MG tablet SMARTSIG:.5 Tablet(s) By Mouth Every Morning   Omega-3 Fatty Acids (FISH OIL) 1000 MG CAPS Take 2  capsules by mouth daily.   timolol (TIMOPTIC) 0.5 % ophthalmic solution 1 drop every morning.           Past Medical History:  Diagnosis Date   Allergic rhinitis    Chest pain    ED (erectile dysfunction)    Hyperlipidemia    Hypertension    SOB (shortness of breath)      Objective:      Wt Readings from Last 3 Encounters:  12/01/20 183 lb (83 kg)  10/10/20 181 lb (82.1 kg)  12/08/13 206 lb (93.4 kg)      Vital signs reviewed  12/01/2020  - Note at rest 02 sats  97% on RA   General appearance:    amb wm nad      HEENT : pt wearing mask not removed for exam due to covid - 19 concerns.    NECK :  without JVD/Nodes/TM/ nl carotid upstrokes bilaterally   LUNGS: no acc muscle use,  Mild barrel  contour chest wall with bilateral  Distant bs s audible wheeze and  without cough on insp or exp maneuvers  and mild  Hyperresonant  to  percussion bilaterally     CV:  RRR  no s3 or murmur or increase in P2, and no edema   ABD:  soft and nontender with pos end  insp Hoover's  in the supine position. No bruits or organomegaly appreciated, bowel sounds nl  MS:   Nl gait/  ext warm without deformities, calf tenderness, cyanosis or clubbing No obvious joint restrictions   SKIN: warm and dry without lesions    NEURO:  alert, approp, nl sensorium with  no motor or cerebellar deficits apparent.           Assessment   N

## 2020-12-01 NOTE — Patient Instructions (Signed)
I encourage you to consider a low dose lung ct as part of your annual exam thru the year 2030  Please schedule a follow up visit in 12 months but call sooner if needed

## 2020-12-01 NOTE — Assessment & Plan Note (Signed)
Quit smoking 2015 - flared p covid 19 jan 22  - 10/10/2020  After extensive coaching inhaler device,  effectiveness =    90% > trial of breztri 2bid and prn saba - PFT's  12/01/2020  FEV1 1.68 (47 % ) ratio 0.58  p 28 % improvement from saba p 0 prior to study with DLCO  19.33 (69%) corrects to 3.52 (86%)  for alv volume and FV curve classically concave     Group D in terms of symptom/risk and laba/lama/ICS  therefore appropriate rx at this point >>>  breztri 2bid and prn saba  Re saba: I spent extra time with pt today reviewing appropriate use of albuterol for prn use on exertion with the following points: 1) saba is for relief of sob that does not improve by walking a slower pace or resting but rather if the pt does not improve after trying this first. 2) If the pt is convinced, as many are, that saba helps recover from activity faster then it's easy to tell if this is the case by re-challenging : ie stop, take the inhaler, then p 5 minutes try the exact same activity (intensity of workload) that just caused the symptoms and see if they are substantially diminished or not after saba 3) if there is an activity that reproducibly causes the symptoms, try the saba 15 min before the activity on alternate days   If in fact the saba really does help, then fine to continue to use it prn but advised may need to look closer at the maintenance regimen being used to achieve better control of airways disease with exertion.    Also  Low-dose CT lung cancer screening is recommended for patients who are 35-11 years of age with a 30+ pack-year history of smoking, and who are currently smoking or quit <=15 years ago.   >>> pt eligible for yearly scans thru 2030 but wants to talk to [facility]   >>> f/u here yearly          Each maintenance medication was reviewed in detail including emphasizing most importantly the difference between maintenance and prns and under what circumstances the prns are to be triggered  using an action plan format where appropriate.  Total time for H and P, chart review, counseling, reviewing hfa device(s) and generating customized AVS unique to this office visit / same day charting = 25 min

## 2021-03-05 DIAGNOSIS — J410 Simple chronic bronchitis: Secondary | ICD-10-CM | POA: Diagnosis not present

## 2021-03-05 DIAGNOSIS — I129 Hypertensive chronic kidney disease with stage 1 through stage 4 chronic kidney disease, or unspecified chronic kidney disease: Secondary | ICD-10-CM | POA: Diagnosis not present

## 2021-03-05 DIAGNOSIS — M25512 Pain in left shoulder: Secondary | ICD-10-CM | POA: Diagnosis not present

## 2021-03-05 DIAGNOSIS — F17200 Nicotine dependence, unspecified, uncomplicated: Secondary | ICD-10-CM | POA: Diagnosis not present

## 2021-03-05 DIAGNOSIS — R7303 Prediabetes: Secondary | ICD-10-CM | POA: Diagnosis not present

## 2021-03-05 DIAGNOSIS — E46 Unspecified protein-calorie malnutrition: Secondary | ICD-10-CM | POA: Diagnosis not present

## 2021-03-05 DIAGNOSIS — L989 Disorder of the skin and subcutaneous tissue, unspecified: Secondary | ICD-10-CM | POA: Diagnosis not present

## 2021-03-05 DIAGNOSIS — J069 Acute upper respiratory infection, unspecified: Secondary | ICD-10-CM | POA: Diagnosis not present

## 2021-03-05 DIAGNOSIS — Z03818 Encounter for observation for suspected exposure to other biological agents ruled out: Secondary | ICD-10-CM | POA: Diagnosis not present

## 2021-03-05 DIAGNOSIS — M79641 Pain in right hand: Secondary | ICD-10-CM | POA: Diagnosis not present

## 2021-03-05 DIAGNOSIS — M720 Palmar fascial fibromatosis [Dupuytren]: Secondary | ICD-10-CM | POA: Diagnosis not present

## 2021-03-14 DIAGNOSIS — J209 Acute bronchitis, unspecified: Secondary | ICD-10-CM | POA: Diagnosis not present

## 2021-03-26 ENCOUNTER — Other Ambulatory Visit: Payer: Self-pay | Admitting: *Deleted

## 2021-03-26 DIAGNOSIS — Z87891 Personal history of nicotine dependence: Secondary | ICD-10-CM

## 2021-04-19 ENCOUNTER — Telehealth: Payer: Medicare Other | Admitting: Acute Care

## 2021-04-19 ENCOUNTER — Ambulatory Visit: Payer: Medicare Other

## 2021-04-23 ENCOUNTER — Encounter: Payer: Self-pay | Admitting: Acute Care

## 2021-04-23 ENCOUNTER — Telehealth (INDEPENDENT_AMBULATORY_CARE_PROVIDER_SITE_OTHER): Payer: Medicare Other | Admitting: Acute Care

## 2021-04-23 ENCOUNTER — Other Ambulatory Visit: Payer: Self-pay

## 2021-04-23 DIAGNOSIS — F17210 Nicotine dependence, cigarettes, uncomplicated: Secondary | ICD-10-CM

## 2021-04-23 NOTE — Progress Notes (Signed)
Shared Decision Making Visit Lung Cancer Screening Program [id number])   Eligibility: Age 70 y.o. Pack Years Smoking History Calculation 65 pack year smoking history (# packs/per year x # years smoked) Recent History of coughing up blood  no Unexplained weight loss? no ( >Than 15 pounds within the last 6 months ) Prior History Lung / other cancer no (Diagnosis within the last 5 years already requiring surveillance chest CT Scans). Smoking Status Current Smoker Former Smokers: Years since quit: NA  Quit Date: NA Pt is still smoking cigars   Visit Components: Discussion included one or more decision making aids. Yes Discussion included risk/benefits of screening. yes Discussion included potential follow up diagnostic testing for abnormal scans. yes Discussion included meaning and risk of over diagnosis. yes Discussion included meaning and risk of False Positives. yes Discussion included meaning of total radiation exposure. yes  Counseling Included: Importance of adherence to annual lung cancer LDCT screening. yes Impact of comorbidities on ability to participate in the program. yes Ability and willingness to under diagnostic treatment. yes  Smoking Cessation Counseling: Current Smokers:  Discussed importance of smoking cessation. yes Information about tobacco cessation classes and interventions provided to patient. yes Patient provided with "ticket" for LDCT Scan. yes Symptomatic Patient. no  CounselingNA Diagnosis Code: Tobacco Use Z72.0 Asymptomatic Patient yes  Counseling (Intermediate counseling: > three minutes counseling) [id number] Former Smokers:  Discussed the importance of maintaining cigarette abstinence. yes Diagnosis Code: Personal History of Nicotine Dependence. H88.502 Information about tobacco cessation classes and interventions provided to patient. Yes Patient provided with "ticket" for LDCT Scan. yes Written Order for Lung Cancer Screening with LDCT placed in  Epic. Yes (CT Chest Lung Cancer Screening Low Dose W/O CM) [id number] Z12.2-Screening of respiratory organs Z87.891-Personal history of nicotine dependence  I spent 25 minutes of face to face time/virtual visit time  with Mr. Gaiser discussing the risks and benefits of lung cancer screening. We took the time to pause the power point at intervals to allow for questions to be asked and answered to ensure understanding. We discussed that he had taken the single most powerful action possible to decrease his risk of developing lung cancer when he quit smoking. I counseled him to remain smoke free, and to contact me if he ever had the desire to smoke again so that I can provide resources and tools to help support the effort to remain smoke free. We discussed the time and location of the scan, and that either  Abigail Miyamoto RN, Karlton Lemon, RN or I  or I will call / send a letter with the results within  24-72 hours of receiving them. He has the office contact information in the event he needs to speak with me,  He verbalized understanding of all of the above and had no further questions upon leaving the office.     I explained to the patient that there has been a high incidence of coronary artery disease noted on these exams. I explained that this is a non-gated exam therefore degree or severity cannot be determined. This patient is not on statin therapy. I have asked the patient to follow-up with their PCP regarding any incidental finding of coronary artery disease and management with diet or medication as they feel is clinically indicated. The patient verbalized understanding of the above and had no further questions.     Bevelyn Ngo, NP 04/23/2021

## 2021-04-23 NOTE — Patient Instructions (Signed)
Thank you for participating in the [facility] Screening [facility]. °It was our pleasure to meet you today. °We will call you with the results of your scan within the next few days. °Your scan will be assigned a Lung RADS category score by the physicians reading the scans.  °This Lung RADS score determines follow up scanning.  °See below for description of categories, and follow up screening recommendations. °We will be in touch to schedule your follow up screening annually or based on recommendations of our providers. °We will fax a copy of your scan results to your Primary Care Physician, or the physician who referred you to the program, to ensure they have the results. °Please call the office if you have any questions or concerns regarding your scanning experience or results.  °Our office number is [phone]. °Please speak with Denise Phelps, RN. She is our Lung Cancer Screening RN. °If she is unavailable when you call, please have the office staff send her a message. She will return your call at her earliest convenience. °Remember, if your scan is normal, we will scan you annually as long as you continue to meet the criteria for the program. (Age 55-77, Current smoker or smoker who has quit within the last 15 years). °If you are a smoker, remember, quitting is the single most powerful action that you can take to decrease your risk of lung cancer and other pulmonary, breathing related problems. °We know quitting is hard, and we are here to help.  °Please let us know if there is anything we can do to help you meet your goal of quitting. °If you are a former smoker, congratulations. We are proud of you! Remain smoke free! °Remember you can refer friends or family members through the number above.  °We will screen them to make sure they meet criteria for the program. °Thank you for helping us take better care of you by participating in Lung Screening. ° °You can receive free nicotine replacement therapy  ( patches, gum or mints) by calling 1-800-QUIT NOW. Please call so we can get you on the path to becoming  a non-smoker. I know it is hard, but you can do this! ° °Lung RADS Categories: ° °Lung RADS 1: no nodules or definitely non-concerning nodules.  °Recommendation is for a repeat annual scan in 12 months. ° °Lung RADS 2:  nodules that are non-concerning in appearance and behavior with a very low likelihood of becoming an active cancer. °Recommendation is for a repeat annual scan in 12 months. ° °Lung RADS 3: nodules that are probably non-concerning , includes nodules with a low likelihood of becoming an active cancer.  Recommendation is for a 6-month repeat screening scan. Often noted after an upper respiratory illness. We will be in touch to make sure you have no questions, and to schedule your 6-month scan. ° °Lung RADS 4 A: nodules with concerning findings, recommendation is most often for a follow up scan in 3 months or additional testing based on our provider's assessment of the scan. We will be in touch to make sure you have no questions and to schedule the recommended 3 month follow up scan. ° °Lung RADS 4 B:  indicates findings that are concerning. We will be in touch with you to schedule additional diagnostic testing based on our provider's  assessment of the scan. ° °Hypnosis for smoking cessation  °[employer] °[phone] ° °Acupuncture for smoking cessation  °[facility] °[phone]  °

## 2021-04-24 ENCOUNTER — Ambulatory Visit
Admission: RE | Admit: 2021-04-24 | Discharge: 2021-04-24 | Disposition: A | Discharge status: Home / Self Care | Payer: Medicare Other | Source: Ambulatory Visit | Attending: Acute Care | Admitting: Acute Care

## 2021-04-24 DIAGNOSIS — K8020 Calculus of gallbladder without cholecystitis without obstruction: Secondary | ICD-10-CM | POA: Diagnosis not present

## 2021-04-24 DIAGNOSIS — I2510 Atherosclerotic heart disease of native coronary artery without angina pectoris: Secondary | ICD-10-CM | POA: Diagnosis not present

## 2021-04-24 DIAGNOSIS — J432 Centrilobular emphysema: Secondary | ICD-10-CM | POA: Diagnosis not present

## 2021-04-24 DIAGNOSIS — Z87891 Personal history of nicotine dependence: Secondary | ICD-10-CM

## 2021-04-26 ENCOUNTER — Other Ambulatory Visit: Payer: Self-pay | Admitting: Acute Care

## 2021-04-26 DIAGNOSIS — Z87891 Personal history of nicotine dependence: Secondary | ICD-10-CM

## 2021-05-03 ENCOUNTER — Telehealth: Payer: Self-pay | Admitting: Acute Care

## 2021-05-03 NOTE — Telephone Encounter (Signed)
Spoke to patient, who is requesting CT results from 04/24/2021.  Sarah, please advise. thanks

## 2021-05-04 NOTE — Telephone Encounter (Signed)
Spoke with pt and reviewed recent lung screening CT results with him. Pt verbalized understanding. Nothing further needed.

## 2021-05-11 DIAGNOSIS — I129 Hypertensive chronic kidney disease with stage 1 through stage 4 chronic kidney disease, or unspecified chronic kidney disease: Secondary | ICD-10-CM | POA: Diagnosis not present

## 2021-05-29 DIAGNOSIS — J40 Bronchitis, not specified as acute or chronic: Secondary | ICD-10-CM | POA: Diagnosis not present

## 2021-05-29 DIAGNOSIS — B349 Viral infection, unspecified: Secondary | ICD-10-CM | POA: Diagnosis not present

## 2021-06-05 DIAGNOSIS — Z720 Tobacco use: Secondary | ICD-10-CM | POA: Diagnosis not present

## 2021-06-05 DIAGNOSIS — J449 Chronic obstructive pulmonary disease, unspecified: Secondary | ICD-10-CM | POA: Diagnosis not present

## 2021-08-27 ENCOUNTER — Ambulatory Visit: Payer: Medicare Other | Admitting: Orthopaedic Surgery

## 2021-08-27 ENCOUNTER — Encounter: Payer: Self-pay | Admitting: Orthopaedic Surgery

## 2021-08-27 ENCOUNTER — Ambulatory Visit (INDEPENDENT_AMBULATORY_CARE_PROVIDER_SITE_OTHER): Payer: Medicare Other

## 2021-08-27 DIAGNOSIS — M25552 Pain in left hip: Secondary | ICD-10-CM

## 2021-08-27 DIAGNOSIS — M7062 Trochanteric bursitis, left hip: Secondary | ICD-10-CM

## 2021-08-27 MED ORDER — LIDOCAINE HCL 1 % IJ SOLN
3.0000 mL | INTRAMUSCULAR | Status: AC | PRN
  Administered 2021-08-27: 3 mL

## 2021-08-27 MED ORDER — METHYLPREDNISOLONE ACETATE 40 MG/ML IJ SUSP
40.0000 mg | INTRAMUSCULAR | Status: AC | PRN
  Administered 2021-08-27: 40 mg via INTRA_ARTICULAR

## 2021-08-27 NOTE — Progress Notes (Signed)
? ?Office Visit Note ?  ?Patient: Kenneth Weber           ?Date of Birth: Dec 25, 1950           ?MRN: [id number] ?Visit Date: 08/27/2021 ?             ?Requested by: [name], MD ?[phone] ?[address] ?[place],  [place] [address] ?PCP: [name], MD ? ? ?Assessment & Plan: ?Visit Diagnoses:  ?1. Pain in left hip   ?2. Trochanteric bursitis, left hip   ? ? ?Plan: His signs and symptoms and clinical exam are consistent with trochanteric bursitis and tendinitis of his left hip.  I showed him stretching exercises to try twice a day with 2 sets at each time.  He is can try Voltaren gel this area and I recommended a steroid injection which she agreed to and tolerated well.  Follow-up can be as needed.  My neck step would be formal physical therapy if this does not get better. ? ?Follow-Up Instructions: Return if symptoms worsen or fail to improve.  ? ?Orders:  ?Orders Placed This Encounter  ?Procedures  ? Large Joint Inj  ? XR HIP UNILAT W OR W/O PELVIS 1V LEFT  ? ?No orders of the defined types were placed in this encounter. ? ? ? ? Procedures: ?Large Joint Inj: L greater trochanter on 08/27/2021 1:29 PM ?Indications: pain and diagnostic evaluation ?Details: 22 G 1.5 in needle, lateral approach ? ?Arthrogram: No ? ?Medications: 3 mL lidocaine 1 %; 40 mg methylPREDNISolone acetate 40 MG/ML ?Outcome: tolerated well, no immediate complications ?Procedure, treatment alternatives, risks and benefits explained, specific risks discussed. Consent was given by the patient. Immediately prior to procedure a time out was called to verify the correct patient, procedure, equipment, support staff and site/side marked as required. Patient was prepped and draped in the usual sterile fashion.  ? ? ? ? ?Clinical Data: ?No additional findings. ? ? ?Subjective: ?Chief Complaint  ?Patient presents with  ? Left Hip - Pain  ?The patient comes in with a chief complaint of left hip pain.  He points to the trochanteric area as a source  of his pain has been going on for about a month and a half.  However he fell several years ago landing on that hip.  Denies any groin pain.  He does not walk with any significant limp.  He is never had surgery on his hips.  He denies any back pain or radicular symptoms. ? ?HPI ? ?Review of Systems ?There is no listed fever, chills, nausea, vomiting ? ?Objective: ?Vital Signs: There were no vitals taken for this visit. ? ?Physical Exam ?he is alert and oriented x3 and in no acute distress ?Ortho Exam ?Examination of his left hip shows it moves smoothly and fluidly with no blocks to rotation and no pain in the groin at all.  When I have him lay on his side with his left hip he has only pain over the tip of the trochanteric area to palpation. ?Specialty Comments:  ?No specialty comments available. ? ?Imaging: ?XR HIP UNILAT W OR W/O PELVIS 1V LEFT ? ?Result Date: 08/27/2021 ?An AP pelvis and lateral left hip shows normal-appearing hips bilaterally.  The joint space is well-maintained.  There is no irregularity or worrisome findings of the trochanteric area where the patient reports pain.  ? ? ?PMFS History: ?Patient Active Problem List  ? Diagnosis Date Noted  ? COPD GOLD  3  10/10/2020  ? ?  Past Medical History:  ?Diagnosis Date  ? Allergic rhinitis   ? Chest pain   ? ED (erectile dysfunction)   ? Hyperlipidemia   ? Hypertension   ? SOB (shortness of breath)   ?  ?History reviewed. No pertinent family history.  ?History reviewed. No pertinent surgical history. ?Social History  ? ?Occupational History  ? Not on file  ?Tobacco Use  ? Smoking status: Former  ?  Packs/day: 1.50  ?  Years: 40.00  ?  Pack years: 60.00  ?  Types: Cigarettes  ?  Quit date: 09/13/2013  ?  Years since quitting: 7.9  ? Smokeless tobacco: Never  ? Tobacco comments:  ?  Patient reports cigars at times but trying to quit, 12/01/2020.   ?Substance and Sexual Activity  ? Alcohol use: No  ? Drug use: No  ? Sexual activity: Not on file  ? ? ? ? ? ? ?

## 2021-09-04 DIAGNOSIS — Z23 Encounter for immunization: Secondary | ICD-10-CM | POA: Diagnosis not present

## 2021-09-04 DIAGNOSIS — I129 Hypertensive chronic kidney disease with stage 1 through stage 4 chronic kidney disease, or unspecified chronic kidney disease: Secondary | ICD-10-CM | POA: Diagnosis not present

## 2021-09-04 DIAGNOSIS — Z0000 Encounter for general adult medical examination without abnormal findings: Secondary | ICD-10-CM | POA: Diagnosis not present

## 2021-09-04 DIAGNOSIS — R7303 Prediabetes: Secondary | ICD-10-CM | POA: Diagnosis not present

## 2021-09-04 DIAGNOSIS — R002 Palpitations: Secondary | ICD-10-CM | POA: Diagnosis not present

## 2021-09-04 DIAGNOSIS — E46 Unspecified protein-calorie malnutrition: Secondary | ICD-10-CM | POA: Diagnosis not present

## 2021-09-04 DIAGNOSIS — M25512 Pain in left shoulder: Secondary | ICD-10-CM | POA: Diagnosis not present

## 2021-09-04 DIAGNOSIS — J449 Chronic obstructive pulmonary disease, unspecified: Secondary | ICD-10-CM | POA: Diagnosis not present

## 2021-09-04 DIAGNOSIS — R233 Spontaneous ecchymoses: Secondary | ICD-10-CM | POA: Diagnosis not present

## 2021-09-04 DIAGNOSIS — I10 Essential (primary) hypertension: Secondary | ICD-10-CM | POA: Diagnosis not present

## 2021-09-19 DIAGNOSIS — D649 Anemia, unspecified: Secondary | ICD-10-CM | POA: Diagnosis not present

## 2021-12-03 ENCOUNTER — Ambulatory Visit: Payer: Medicare Other | Admitting: Internal Medicine

## 2021-12-03 DIAGNOSIS — I10 Essential (primary) hypertension: Secondary | ICD-10-CM | POA: Diagnosis not present

## 2021-12-07 ENCOUNTER — Other Ambulatory Visit: Payer: Self-pay | Admitting: Internal Medicine

## 2021-12-10 ENCOUNTER — Ambulatory Visit: Payer: Medicare Other | Admitting: Internal Medicine

## 2021-12-10 ENCOUNTER — Encounter: Payer: Self-pay | Admitting: Internal Medicine

## 2021-12-10 DIAGNOSIS — J449 Chronic obstructive pulmonary disease, unspecified: Secondary | ICD-10-CM | POA: Diagnosis not present

## 2021-12-10 DIAGNOSIS — Z87891 Personal history of nicotine dependence: Secondary | ICD-10-CM | POA: Diagnosis not present

## 2021-12-10 MED ORDER — BREZTRI AEROSPHERE 160-9-4.8 MCG/ACT IN AERO: INHALATION_SPRAY | RESPIRATORY_TRACT | 0 refills | Status: DC

## 2021-12-10 MED ORDER — ALBUTEROL SULFATE HFA 108 (90 BASE) MCG/ACT IN AERS: INHALATION_SPRAY | RESPIRATORY_TRACT | 11 refills | Status: DC

## 2021-12-10 NOTE — Assessment & Plan Note (Signed)
Quit smoking 2015 - flared p covid 19 jan 22  - 10/10/2020  After extensive coaching inhaler device,  effectiveness =    90% > trial of breztri 2bid and prn saba - PFT's  12/01/2020  FEV1 1.68 (47 % ) ratio 0.58  p 28 % improvement from saba p 0 prior to study with DLCO  19.33 (69%) corrects to 3.52 (86%)  for alv volume and FV curve classically concave   - LDSCT  12/202/22  Emphysema  - Labs ordered 12/10/2021  :    alpha one AT phenotype  Pending  - The proper method of use, as well as anticipated side effects, of a metered-dose inhaler were discussed and demonstrated to the patient using teach back method    Group D (now reclassified as E) in terms of symptom/risk and laba/lama/ICS  therefore appropriate rx at this point >>>  Continue brezri and apprp saba   Re SABA :  I spent extra time with pt today reviewing appropriate use of albuterol for prn use on exertion with the following points: 1) saba is for relief of sob that does not improve by walking a slower pace or resting but rather if the pt does not improve after trying this first. 2) If the pt is convinced, as many are, that saba helps recover from activity faster then it's easy to tell if this is the case by re-challenging : ie stop, take the inhaler, then p 5 minutes try the exact same activity (intensity of workload) that just caused the symptoms and see if they are substantially diminished or not after saba 3) if there is an activity that reproducibly causes the symptoms, try the saba 15 min before the activity on alternate days   If in fact the saba really does help, then fine to continue to use it prn but advised may need to look closer at the maintenance regimen being used to achieve better control of airways disease with exertion.

## 2021-12-10 NOTE — Patient Instructions (Addendum)
Plan A = Automatic = Always=    Breztri Take 2 puffs first thing in am and then another 2 puffs about 12 hours later.    Plan B = Backup (to supplement plan A, not to replace it) Only use your albuterol inhaler as a rescue medication to be used if you can't catch your breath by resting or doing a relaxed purse lip breathing pattern.  - The less you use it, the better it will work when you need it. - Ok to use the inhaler up to 2 puffs  every 4 hours if you must but call for appointment if use goes up over your usual need - Don't leave home without it !!  (think of it like the spare tire for your car)   Please remember to go to the lab department   for your tests - we will call you with the results when they are available.      Please schedule a follow up visit in 12 months but call sooner if needed

## 2021-12-10 NOTE — Progress Notes (Signed)
Kenneth Weber, male    DOB: 03/01/1951   MRN: [id number]   Brief patient profile:  21  yow male quit smoking 09/2013 with doe  Only sob up[place] s need for inhalers vax x 2 then omicron Jan 2022 cough/sob and sick x 10 days other added x albuterol ? Helped but not back to baseline ex tol more due to fatigue than sob referred to pulmonary clinic in [place]  10/10/2020 by Dr   [name]   History of Present Illness  10/10/2020  Pulmonary/ 1st office eval/Isacc Turney  Chief Complaint  Patient presents with   Pulmonary Consult    Referred by Dr [name]. Pt states had covid 19 in Jan 2022-having SOB since then. He gets SOB when lifting things.   Dyspnea:  MMRC1 = can walk nl pace, flat grade, can't hurry or go uphills or steps s sob   Cough: none  Sleep: wheeze at night sometimes  SABA use: twice a week esp noct   Rec I would prefer you not use Timoptic eyedrops and instead Betoptic  Plan A = Automatic = Always=   Breztri Take 2 puffs first thing in am and then another 2 puffs about 12 hours later - ok to reduce to one twice daily  Work on inhaler technique: Plan B = Backup (to supplement plan A, not to replace it) Only use your albuterol inhaler as rescue Please schedule a follow up office visit in 6 weeks, call sooner if needed with pfts on return    12/01/2020  f/u ov/Daniele Dillow re:  GOLD 3 copd maint on breztri until this am  Chief Complaint  Patient presents with   Follow-up    PFT review.    Dyspnea:  MMRC1 = can walk nl pace, flat grade, can't hurry or go uphills or steps s sob   Cough: none  Sleeping: no resp issues  SABA use: none 02: none  Covid status:   vax x one / omicron Jan 2022  Rec Lung ca screening   12/10/2021  f/u ov/Muhannad Bignell re: GOLD 3 copd  maint on breztri 2bid  Chief Complaint  Patient presents with   Follow-up    Breathing is overall doing well. He uses his albuterol inhaler about 3 x per wk.   Dyspnea:  MMRC1 = can walk nl pace, flat grade, can't hurry or go uphills or  steps s sob   Cough: none  Sleeping: able to lie flat now SABA use: rarely  02: none      No obvious day to day or daytime variability or assoc excess/ purulent sputum or mucus plugs or hemoptysis or cp or chest tightness, subjective wheeze or overt sinus or hb symptoms.   Sleeping  without nocturnal  or early am exacerbation  of respiratory  c/o's or need for noct saba. Also denies any obvious fluctuation of symptoms with weather or environmental changes or other aggravating or alleviating factors except as outlined above   No unusual exposure hx or h/o childhood pna/ asthma or knowledge of premature birth.  Current Allergies, Complete Past Medical History, Past Surgical History, Family History, and Social History were reviewed in [employer] record.  ROS  The following are not active complaints unless bolded Hoarseness, sore throat, dysphagia, dental problems, itching, sneezing,  nasal congestion or discharge of excess mucus or purulent secretions, ear ache,   fever, chills, sweats, unintended wt loss or wt gain, classically pleuritic or exertional cp,  orthopnea pnd or arm/hand swelling  or leg swelling, presyncope, palpitations, abdominal pain, anorexia, nausea, vomiting, diarrhea  or change in bowel habits or change in bladder habits, change in stools or change in urine, dysuria, hematuria,  rash, arthralgias, visual complaints, headache, numbness, weakness or ataxia or problems with walking or coordination,  change in mood or  memory.        Current Meds  Medication Sig   acetaminophen (TYLENOL) 325 MG tablet Take 650 mg by mouth every 6 (six) hours as needed.   albuterol (PROVENTIL HFA;VENTOLIN HFA) 108 (90 BASE) MCG/ACT inhaler Inhale 2 puffs into the lungs every 6 (six) hours as needed for wheezing or shortness of breath.   amLODipine (NORVASC) 5 MG tablet Take 5 mg by mouth daily.   Ascorbic Acid (VITAMIN C) 100 MG tablet Take 100 mg by mouth daily.    BREZTRI AEROSPHERE 160-9-4.8 MCG/ACT AERO Inhale 2 puffs by mouth twice daily   Elderberry 500 MG CAPS Take 1 tablet by mouth daily.   latanoprost (XALATAN) 0.005 % ophthalmic solution    olmesartan (BENICAR) 20 MG tablet Take 40 mg by mouth daily.   Omega-3 Fatty Acids (FISH OIL) 1000 MG CAPS Take 2 capsules by mouth daily.   timolol (TIMOPTIC) 0.5 % ophthalmic solution 1 drop every morning.   [DISCONTINUED] olmesartan-hydrochlorothiazide (BENICAR HCT) 40-12.5 MG tablet Take 1 tablet by mouth daily.              Past Medical History:  Diagnosis Date   Allergic rhinitis    Chest pain    ED (erectile dysfunction)    Hyperlipidemia    Hypertension    SOB (shortness of breath)      Objective:      12/10/2021         188   12/01/20 183 lb (83 kg)  10/10/20 181 lb (82.1 kg)  12/08/13 206 lb (93.4 kg)    Vital signs reviewed  12/10/2021  - Note at rest 02 sats  93% on RA   General appearance:    amb wm nad    HEENT : Oropharynx  clear  Nasal turbinates nl    NECK :  without  apparent JVD/ palpable Nodes/TM    LUNGS: no acc muscle use,  Mild barrel  contour chest wall with bilateral  Distant bs s audible wheeze and  without cough on insp or exp maneuvers  and mild  Hyperresonant  to  percussion bilaterally     CV:  RRR  no s3 or murmur or increase in P2, and no edema   ABD:  soft and nontender with pos end  insp Hoover's  in the supine position.  No bruits or organomegaly appreciated   MS:  Nl gait/ ext warm without deformities Or obvious joint restrictions  calf tenderness, cyanosis or clubbing     SKIN: warm and dry without lesions    NEURO:  alert, approp, nl sensorium with  no motor or cerebellar deficits apparent.            I personally reviewed images and agree with radiology impression as follows:   Chest LDSCT  04/24/21 1. Lung-RADS 2, benign appearance or behavior. Continue annual screening with low-dose chest CT without contrast in 12 months. 2.  Cholelithiasis. 3. Aortic atherosclerosis (ICD10-I70.0). Coronary artery calcification. 4.  Emphysema (ICD10-J43.9).      Assessment   N

## 2021-12-10 NOTE — Assessment & Plan Note (Signed)
Stopped smoking 2015 > started LDSCT 04/2021   Low-dose CT lung cancer screening is recommended for patients who are 23-70 years of age with a 20+ pack-year history of smoking and who are currently smoking or quit <=15 years ago. No coughing up blood  No unintentional weight loss of > 15 pounds in the last 6 months - pt is eligible for scanning yearly until 2030 / advised   Discussed in detail all the  indications, usual  risks and alternatives  relative to the benefits with patient who agrees to proceed with w/u as outlined.            Each maintenance medication was reviewed in detail including emphasizing most importantly the difference between maintenance and prns and under what circumstances the prns are to be triggered using an action plan format where appropriate.  Total time for H and P, chart review, counseling, reviewing hfa device(s) and generating customized AVS unique to this office visit / same day charting => 30 min

## 2021-12-13 LAB — ALPHA-1-ANTITRYPSIN PHENOTYP: A-1 Antitrypsin: 135 mg/dL (ref 101–187)

## 2021-12-23 ENCOUNTER — Emergency Department (HOSPITAL_BASED_OUTPATIENT_CLINIC_OR_DEPARTMENT_OTHER): Payer: Medicare Other

## 2021-12-23 ENCOUNTER — Other Ambulatory Visit: Payer: Self-pay

## 2021-12-23 ENCOUNTER — Emergency Department (HOSPITAL_BASED_OUTPATIENT_CLINIC_OR_DEPARTMENT_OTHER)
Admission: EM | Admit: 2021-12-23 | Discharge: 2021-12-24 | Disposition: A | Discharge status: Home / Self Care | Payer: Medicare Other | Attending: Emergency Medicine | Admitting: Emergency Medicine

## 2021-12-23 DIAGNOSIS — J449 Chronic obstructive pulmonary disease, unspecified: Secondary | ICD-10-CM | POA: Diagnosis not present

## 2021-12-23 DIAGNOSIS — I10 Essential (primary) hypertension: Secondary | ICD-10-CM | POA: Diagnosis not present

## 2021-12-23 DIAGNOSIS — R0789 Other chest pain: Secondary | ICD-10-CM

## 2021-12-23 DIAGNOSIS — R079 Chest pain, unspecified: Secondary | ICD-10-CM | POA: Diagnosis not present

## 2021-12-23 DIAGNOSIS — M7989 Other specified soft tissue disorders: Secondary | ICD-10-CM | POA: Diagnosis not present

## 2021-12-23 DIAGNOSIS — R220 Localized swelling, mass and lump, head: Secondary | ICD-10-CM | POA: Insufficient documentation

## 2021-12-23 DIAGNOSIS — Z79899 Other long term (current) drug therapy: Secondary | ICD-10-CM | POA: Insufficient documentation

## 2021-12-23 DIAGNOSIS — R229 Localized swelling, mass and lump, unspecified: Secondary | ICD-10-CM

## 2021-12-23 LAB — CBC
HCT: 42 % (ref 39.0–52.0)
Hemoglobin: 14.2 g/dL (ref 13.0–17.0)
MCH: 32.6 pg (ref 26.0–34.0)
MCHC: 33.8 g/dL (ref 30.0–36.0)
MCV: 96.6 fL (ref 80.0–100.0)
Platelets: 273 10*3/uL (ref 150–400)
RBC: 4.35 MIL/uL (ref 4.22–5.81)
RDW: 12.4 % (ref 11.5–15.5)
WBC: 8.2 10*3/uL (ref 4.0–10.5)
nRBC: 0 % (ref 0.0–0.2)

## 2021-12-23 LAB — BASIC METABOLIC PANEL
Anion gap: 9 (ref 5–15)
BUN: 17 mg/dL (ref 8–23)
CO2: 26 mmol/L (ref 22–32)
Calcium: 9.3 mg/dL (ref 8.9–10.3)
Chloride: 104 mmol/L (ref 98–111)
Creatinine, Ser: 1.25 mg/dL — ABNORMAL HIGH (ref 0.61–1.24)
GFR, Estimated: >60 mL/min (ref >60)
Glucose, Bld: 100 mg/dL — ABNORMAL HIGH (ref 70–99)
Potassium: 3.9 mmol/L (ref 3.5–5.1)
Sodium: 139 mmol/L (ref 135–145)

## 2021-12-23 LAB — TROPONIN I (HIGH SENSITIVITY): Troponin I (High Sensitivity): 3 ng/L (ref <18)

## 2021-12-23 NOTE — ED Provider Notes (Signed)
[facility] EMERGENCY DEPT Provider Note   CSN: [id number] Arrival date & time: 12/23/21  2033     History {Add pertinent medical, surgical, social history, OB history to HPI:1} Chief Complaint  Patient presents with   Mass   Chest Pain    Kenneth Weber is a 71 y.o. male.  HPI     This is a 71 year old male with history of COPD who presents with left arm swelling.  Patient reports that he was playing a video game earlier today when he noted swelling in the left antecubital fossa.  He had never noted it before.  He states it is slightly painful to touch and painful with range of motion.  He denies any trauma or injury.  He has not noted any redness or erythema.  No itchiness.  No bite.  Of note, he has noted some "tingling in his chest."  It is not exertional in nature.  No recent fevers or shortness of breath.  He has never had anything like that before as well.  Home Medications Prior to Admission medications   Medication Sig Start Date End Date Taking? Authorizing Provider  acetaminophen (TYLENOL) 325 MG tablet Take 650 mg by mouth every 6 (six) hours as needed.    [provider]  albuterol (VENTOLIN HFA) 108 (90 Base) MCG/ACT inhaler Up to 2 puffs every 4 hours as needed 12/10/21   Nyoka Cowden, MD  amLODipine (NORVASC) 5 MG tablet Take 5 mg by mouth daily.    [provider]  Ascorbic Acid (VITAMIN C) 100 MG tablet Take 100 mg by mouth daily.    [provider]  Budeson-Glycopyrrol-Formoterol (BREZTRI AEROSPHERE) 160-9-4.8 MCG/ACT AERO Take 2 puffs first thing in am and then another 2 puffs about 12 hours later. 12/10/21   Nyoka Cowden, MD  Elderberry 500 MG CAPS Take 1 tablet by mouth daily.    [provider]  latanoprost (XALATAN) 0.005 % ophthalmic solution  09/07/19   [provider]  olmesartan (BENICAR) 20 MG tablet Take 40 mg by mouth daily.    [provider]  Omega-3 Fatty Acids (FISH OIL) 1000 MG  CAPS Take 2 capsules by mouth daily.    [provider]  timolol (TIMOPTIC) 0.5 % ophthalmic solution 1 drop every morning. 08/17/19   [provider]      Allergies    Other, Streptogramins, Codeine, Lisinopril, and Losartan potassium-hctz    Review of Systems   Review of Systems  Constitutional:  Negative for fever.  Cardiovascular:  Positive for chest pain.  Musculoskeletal:        Left arm swelling and pain  All other systems reviewed and are negative.   Physical Exam Updated Vital Signs BP (!) 164/81 (BP Location: Right Arm)   Pulse 89   Temp 98.2 F (36.8 C)   Resp 16   Ht 1.829 m (6')   Wt 85.3 kg   SpO2 97%   BMI 25.50 kg/m  Physical Exam Vitals and nursing note reviewed.  Constitutional:      Appearance: He is well-developed. He is not ill-appearing.  HENT:     Head: Normocephalic and atraumatic.  Eyes:     Pupils: Pupils are equal, round, and reactive to light.  Cardiovascular:     Rate and Rhythm: Normal rate and regular rhythm.     Heart sounds: Normal heart sounds. No murmur heard. Pulmonary:     Effort: Pulmonary effort is normal. No respiratory distress.  Breath sounds: Wheezing present.  Abdominal:     General: Bowel sounds are normal.     Palpations: Abdomen is soft.     Tenderness: There is no abdominal tenderness. There is no rebound.  Musculoskeletal:     Cervical back: Neck supple.     Right lower leg: No edema.     Left lower leg: No edema.     Comments: Patient has slight asymmetric swelling of the medial aspect of the left antecubital fossa, soft tissue swelling, no mass, does not appear to be muscular in origin, there is no fluctuance or overlying skin changes to suggest abscess, similar yet slightly smaller soft tissue protrusion in the right antecubital, 2+ radial pulses bilaterally  Lymphadenopathy:     Cervical: No cervical adenopathy.  Skin:    General: Skin is warm and dry.  Neurological:     Mental Status: He  is alert and oriented to person, place, and time.  Psychiatric:        Mood and Affect: Mood normal.     ED Results / Procedures / Treatments   Labs (all labs ordered are listed, but only abnormal results are displayed) Labs Reviewed  BASIC METABOLIC PANEL - Abnormal; Notable for the following components:      Result Value   Glucose, Bld 100 (*)    Creatinine, Ser 1.25 (*)    All other components within normal limits  CBC  TROPONIN I (HIGH SENSITIVITY)  TROPONIN I (HIGH SENSITIVITY)    EKG EKG Interpretation  Date/Time:  Sunday December 23 2021 20:47:06 EDT Ventricular Rate:  79 PR Interval:  166 QRS Duration: 104 QT Interval:  382 QTC Calculation: 438 R Axis:   -3 Text Interpretation: Normal sinus rhythm Septal infarct , age undetermined Abnormal ECG No previous ECGs available Confirmed by Ernie Avena ([id number]) on 12/23/2021 10:50:30 PM  Radiology No results found.  Procedures Procedures  {Document cardiac monitor, telemetry assessment procedure when appropriate:1}  Medications Ordered in ED Medications - No data to display  ED Course/ Medical Decision Making/ A&P                           Medical Decision Making Amount and/or Complexity of Data Reviewed Labs: ordered.   ***  {Document critical care time when appropriate:1} {Document review of labs and clinical decision tools ie heart score, Chads2Vasc2 etc:1}  {Document your independent review of radiology images, and any outside records:1} {Document your discussion with family members, caretakers, and with consultants:1} {Document social determinants of health affecting pt's care:1} {Document your decision making why or why not admission, treatments were needed:1} Final Clinical Impression(s) / ED Diagnoses Final diagnoses:  None    Rx / DC Orders ED Discharge Orders     None

## 2021-12-23 NOTE — ED Triage Notes (Signed)
Pt reports lump on inner aspect of L elbow x1 day.

## 2021-12-23 NOTE — ED Triage Notes (Signed)
Pt reports L sided CP also

## 2021-12-23 NOTE — ED Notes (Signed)
Multiple attempts to get labs

## 2021-12-24 NOTE — Discharge Instructions (Signed)
You were seen today for concerns for soft tissue swelling of the left arm.  This is not seem to be infectious or inflammatory in nature.  It appears to be localized to the soft tissues with a similar soft tissue protrusion on the right.  Monitor closely.  If you develop signs of redness, significant pain, increase swelling, you should be reevaluated.  Apply ice.  Regarding your chest discomfort, your work-up was reassuring including chest x-ray and heart testing.  Follow-up closely with your primary physician.

## 2021-12-31 ENCOUNTER — Encounter (HOSPITAL_BASED_OUTPATIENT_CLINIC_OR_DEPARTMENT_OTHER): Payer: Self-pay

## 2021-12-31 DIAGNOSIS — J45909 Unspecified asthma, uncomplicated: Secondary | ICD-10-CM | POA: Insufficient documentation

## 2021-12-31 DIAGNOSIS — Z7951 Long term (current) use of inhaled steroids: Secondary | ICD-10-CM | POA: Diagnosis not present

## 2021-12-31 DIAGNOSIS — I10 Essential (primary) hypertension: Secondary | ICD-10-CM | POA: Diagnosis not present

## 2021-12-31 DIAGNOSIS — I160 Hypertensive urgency: Secondary | ICD-10-CM | POA: Diagnosis not present

## 2021-12-31 DIAGNOSIS — Z79899 Other long term (current) drug therapy: Secondary | ICD-10-CM | POA: Insufficient documentation

## 2021-12-31 DIAGNOSIS — R519 Headache, unspecified: Secondary | ICD-10-CM | POA: Diagnosis present

## 2021-12-31 NOTE — ED Triage Notes (Signed)
Pt states that his BP has been high all day in the 180's, checked it because he had a headache, no other symptoms.

## 2022-01-01 ENCOUNTER — Emergency Department (HOSPITAL_BASED_OUTPATIENT_CLINIC_OR_DEPARTMENT_OTHER)
Admission: EM | Admit: 2022-01-01 | Discharge: 2022-01-01 | Disposition: A | Discharge status: Home / Self Care | Payer: Medicare Other | Attending: Emergency Medicine | Admitting: Emergency Medicine

## 2022-01-01 ENCOUNTER — Emergency Department (HOSPITAL_BASED_OUTPATIENT_CLINIC_OR_DEPARTMENT_OTHER): Payer: Medicare Other

## 2022-01-01 DIAGNOSIS — R519 Headache, unspecified: Secondary | ICD-10-CM | POA: Diagnosis not present

## 2022-01-01 DIAGNOSIS — I160 Hypertensive urgency: Secondary | ICD-10-CM

## 2022-01-01 LAB — BASIC METABOLIC PANEL
Anion gap: 8 (ref 5–15)
BUN: 25 mg/dL — ABNORMAL HIGH (ref 8–23)
CO2: 25 mmol/L (ref 22–32)
Calcium: 9.4 mg/dL (ref 8.9–10.3)
Chloride: 106 mmol/L (ref 98–111)
Creatinine, Ser: 1.14 mg/dL (ref 0.61–1.24)
GFR, Estimated: >60 mL/min (ref >60)
Glucose, Bld: 102 mg/dL — ABNORMAL HIGH (ref 70–99)
Potassium: 3.9 mmol/L (ref 3.5–5.1)
Sodium: 139 mmol/L (ref 135–145)

## 2022-01-01 LAB — CBC WITH DIFFERENTIAL/PLATELET
Abs Immature Granulocytes: 0.01 10*3/uL (ref 0.00–0.07)
Basophils Absolute: 0 10*3/uL (ref 0.0–0.1)
Basophils Relative: 1 %
Eosinophils Absolute: 0.2 10*3/uL (ref 0.0–0.5)
Eosinophils Relative: 2 %
HCT: 38.5 % — ABNORMAL LOW (ref 39.0–52.0)
Hemoglobin: 12.9 g/dL — ABNORMAL LOW (ref 13.0–17.0)
Immature Granulocytes: 0 %
Lymphocytes Relative: 33 %
Lymphs Abs: 2.2 10*3/uL (ref 0.7–4.0)
MCH: 31.9 pg (ref 26.0–34.0)
MCHC: 33.5 g/dL (ref 30.0–36.0)
MCV: 95.3 fL (ref 80.0–100.0)
Monocytes Absolute: 0.8 10*3/uL (ref 0.1–1.0)
Monocytes Relative: 11 %
Neutro Abs: 3.5 10*3/uL (ref 1.7–7.7)
Neutrophils Relative %: 53 %
Platelets: 235 10*3/uL (ref 150–400)
RBC: 4.04 MIL/uL — ABNORMAL LOW (ref 4.22–5.81)
RDW: 12.3 % (ref 11.5–15.5)
WBC: 6.6 10*3/uL (ref 4.0–10.5)
nRBC: 0 % (ref 0.0–0.2)

## 2022-01-01 MED ORDER — CLONIDINE HCL 0.1 MG PO TABS
0.1000 mg | ORAL_TABLET | Freq: Once | ORAL | Status: AC
  Administered 2022-01-01: 0.1 mg via ORAL
  Filled 2022-01-01: qty 1

## 2022-01-01 MED ORDER — CLONIDINE HCL 0.1 MG PO TABS: 0.2000 mg | ORAL_TABLET | Freq: Two times a day (BID) | ORAL | 0 refills | Status: DC | PRN

## 2022-01-01 NOTE — ED Notes (Signed)
Pt verbalizes understanding of discharge instructions. Opportunity for questioning and answers were provided. Pt discharged from ED to home with wife.    

## 2022-01-01 NOTE — ED Provider Notes (Signed)
[facility] EMERGENCY DEPT Provider Note   CSN: [id number] Arrival date & time: 12/31/21  2346     History  Chief Complaint  Patient presents with   Hypertension    Kenneth Weber is a 71 y.o. male.  Patient is a 71 year old male with past medical history of hypertension, asthma.  Patient presenting today for elevated blood pressure.  Patient states that he has had a headache all day, then has been monitoring his blood pressure throughout the day.  He is getting consistent readings of 180 systolic which is much higher than his normal.  He denies to me he is having any chest pain or difficulty breathing.  He denies having missed any doses of his medications.  He denies any visual disturbances, weakness, or numbness.  The history is provided by the patient.       Home Medications Prior to Admission medications   Medication Sig Start Date End Date Taking? Authorizing Provider  acetaminophen (TYLENOL) 325 MG tablet Take 650 mg by mouth every 6 (six) hours as needed.    [provider]  albuterol (VENTOLIN HFA) 108 (90 Base) MCG/ACT inhaler Up to 2 puffs every 4 hours as needed 12/10/21   Nyoka Cowden, MD  amLODipine (NORVASC) 5 MG tablet Take 5 mg by mouth daily.    [provider]  Ascorbic Acid (VITAMIN C) 100 MG tablet Take 100 mg by mouth daily.    [provider]  Budeson-Glycopyrrol-Formoterol (BREZTRI AEROSPHERE) 160-9-4.8 MCG/ACT AERO Take 2 puffs first thing in am and then another 2 puffs about 12 hours later. 12/10/21   Nyoka Cowden, MD  Elderberry 500 MG CAPS Take 1 tablet by mouth daily.    [provider]  latanoprost (XALATAN) 0.005 % ophthalmic solution  09/07/19   [provider]  olmesartan (BENICAR) 20 MG tablet Take 40 mg by mouth daily.    [provider]  Omega-3 Fatty Acids (FISH OIL) 1000 MG CAPS Take 2 capsules by mouth daily.    [provider]  timolol (TIMOPTIC) 0.5 % ophthalmic  solution 1 drop every morning. 08/17/19   [provider]      Allergies    Other, Streptogramins, Codeine, Lisinopril, and Losartan potassium-hctz    Review of Systems   Review of Systems  All other systems reviewed and are negative.   Physical Exam Updated Vital Signs BP (!) 170/74   Pulse 65   Temp 97.7 F (36.5 C) (Oral)   Resp 19   Ht 6' (1.829 m)   Wt 85.3 kg   SpO2 97%   BMI 25.50 kg/m  Physical Exam Vitals and nursing note reviewed.  Constitutional:      General: He is not in acute distress.    Appearance: He is well-developed. He is not diaphoretic.  HENT:     Head: Normocephalic and atraumatic.  Eyes:     Extraocular Movements: Extraocular movements intact.     Pupils: Pupils are equal, round, and reactive to light.  Cardiovascular:     Rate and Rhythm: Normal rate and regular rhythm.     Heart sounds: No murmur heard.    No friction rub.  Pulmonary:     Effort: Pulmonary effort is normal. No respiratory distress.     Breath sounds: Normal breath sounds. No wheezing or rales.  Abdominal:     General: Bowel sounds are normal. There is no distension.     Palpations: Abdomen is soft.     Tenderness:  There is no abdominal tenderness.  Musculoskeletal:        General: Normal range of motion.     Cervical back: Normal range of motion and neck supple.  Skin:    General: Skin is warm and dry.  Neurological:     General: No focal deficit present.     Mental Status: He is alert and oriented to person, place, and time.     Cranial Nerves: No cranial nerve deficit.     Coordination: Coordination normal.     ED Results / Procedures / Treatments   Labs (all labs ordered are listed, but only abnormal results are displayed) Labs Reviewed  BASIC METABOLIC PANEL  CBC WITH DIFFERENTIAL/PLATELET    EKG None  Radiology No results found.  Procedures Procedures  {Document cardiac monitor, telemetry assessment procedure when  appropriate:1}  Medications Ordered in ED Medications  cloNIDine (CATAPRES) tablet 0.1 mg (has no administration in time range)    ED Course/ Medical Decision Making/ A&P                           Medical Decision Making Amount and/or Complexity of Data Reviewed Labs: ordered. Radiology: ordered.  Risk Prescription drug management.   ***  {Document critical care time when appropriate:1} {Document review of labs and clinical decision tools ie heart score, Chads2Vasc2 etc:1}  {Document your independent review of radiology images, and any outside records:1} {Document your discussion with family members, caretakers, and with consultants:1} {Document social determinants of health affecting pt's care:1} {Document your decision making why or why not admission, treatments were needed:1} Final Clinical Impression(s) / ED Diagnoses Final diagnoses:  None    Rx / DC Orders ED Discharge Orders     None

## 2022-01-01 NOTE — Discharge Instructions (Signed)
Begin taking clonidine as prescribed as needed for blood pressures sustained greater than 180.  Continue other medications as previously prescribed.  Keep a record of your blood pressures and follow-up with your primary doctor to discuss the results.

## 2022-01-08 ENCOUNTER — Telehealth: Payer: Self-pay | Admitting: Internal Medicine

## 2022-01-10 MED ORDER — BREZTRI AEROSPHERE 160-9-4.8 MCG/ACT IN AERO: INHALATION_SPRAY | RESPIRATORY_TRACT | 11 refills | Status: DC

## 2022-01-10 NOTE — Telephone Encounter (Signed)
Rx for pt's Kenneth Weber has been sent electronically to [facility]. Attempted to call pt's spouse Kenneth Weber but unable to reach. Left message for her letting her know that rx was sent. Nothing further needed.

## 2022-02-25 DIAGNOSIS — I10 Essential (primary) hypertension: Secondary | ICD-10-CM | POA: Diagnosis not present

## 2022-02-25 DIAGNOSIS — R519 Headache, unspecified: Secondary | ICD-10-CM | POA: Diagnosis not present

## 2022-03-11 DIAGNOSIS — F17200 Nicotine dependence, unspecified, uncomplicated: Secondary | ICD-10-CM | POA: Diagnosis not present

## 2022-03-11 DIAGNOSIS — R7303 Prediabetes: Secondary | ICD-10-CM | POA: Diagnosis not present

## 2022-03-11 DIAGNOSIS — E46 Unspecified protein-calorie malnutrition: Secondary | ICD-10-CM | POA: Diagnosis not present

## 2022-03-11 DIAGNOSIS — R002 Palpitations: Secondary | ICD-10-CM | POA: Diagnosis not present

## 2022-03-11 DIAGNOSIS — I129 Hypertensive chronic kidney disease with stage 1 through stage 4 chronic kidney disease, or unspecified chronic kidney disease: Secondary | ICD-10-CM | POA: Diagnosis not present

## 2022-03-11 DIAGNOSIS — M25512 Pain in left shoulder: Secondary | ICD-10-CM | POA: Diagnosis not present

## 2022-03-11 DIAGNOSIS — R233 Spontaneous ecchymoses: Secondary | ICD-10-CM | POA: Diagnosis not present

## 2022-03-11 DIAGNOSIS — J449 Chronic obstructive pulmonary disease, unspecified: Secondary | ICD-10-CM | POA: Diagnosis not present

## 2022-03-11 DIAGNOSIS — M720 Palmar fascial fibromatosis [Dupuytren]: Secondary | ICD-10-CM | POA: Diagnosis not present

## 2022-03-25 DIAGNOSIS — J449 Chronic obstructive pulmonary disease, unspecified: Secondary | ICD-10-CM | POA: Diagnosis not present

## 2022-03-25 DIAGNOSIS — I10 Essential (primary) hypertension: Secondary | ICD-10-CM | POA: Diagnosis not present

## 2022-04-13 DIAGNOSIS — Z03818 Encounter for observation for suspected exposure to other biological agents ruled out: Secondary | ICD-10-CM | POA: Diagnosis not present

## 2022-04-13 DIAGNOSIS — R0981 Nasal congestion: Secondary | ICD-10-CM | POA: Diagnosis not present

## 2022-04-13 DIAGNOSIS — R509 Fever, unspecified: Secondary | ICD-10-CM | POA: Diagnosis not present

## 2022-04-13 DIAGNOSIS — J069 Acute upper respiratory infection, unspecified: Secondary | ICD-10-CM | POA: Diagnosis not present

## 2022-04-13 DIAGNOSIS — R051 Acute cough: Secondary | ICD-10-CM | POA: Diagnosis not present

## 2022-04-22 ENCOUNTER — Inpatient Hospital Stay (HOSPITAL_COMMUNITY)
Admission: EM | Admit: 2022-04-22 | Discharge: 2022-05-01 | DRG: 193 | Disposition: A | Discharge status: Home / Self Care | Payer: Medicare Other | Attending: Internal Medicine | Admitting: Internal Medicine

## 2022-04-22 ENCOUNTER — Emergency Department (HOSPITAL_COMMUNITY): Payer: Medicare Other

## 2022-04-22 ENCOUNTER — Encounter (HOSPITAL_COMMUNITY): Payer: Self-pay

## 2022-04-22 ENCOUNTER — Other Ambulatory Visit: Payer: Self-pay

## 2022-04-22 DIAGNOSIS — K922 Gastrointestinal hemorrhage, unspecified: Secondary | ICD-10-CM | POA: Diagnosis not present

## 2022-04-22 DIAGNOSIS — F17200 Nicotine dependence, unspecified, uncomplicated: Secondary | ICD-10-CM | POA: Diagnosis not present

## 2022-04-22 DIAGNOSIS — K5900 Constipation, unspecified: Secondary | ICD-10-CM | POA: Diagnosis not present

## 2022-04-22 DIAGNOSIS — Z79899 Other long term (current) drug therapy: Secondary | ICD-10-CM

## 2022-04-22 DIAGNOSIS — J441 Chronic obstructive pulmonary disease with (acute) exacerbation: Secondary | ICD-10-CM | POA: Diagnosis not present

## 2022-04-22 DIAGNOSIS — Z7951 Long term (current) use of inhaled steroids: Secondary | ICD-10-CM

## 2022-04-22 DIAGNOSIS — I4819 Other persistent atrial fibrillation: Secondary | ICD-10-CM | POA: Diagnosis not present

## 2022-04-22 DIAGNOSIS — I4891 Unspecified atrial fibrillation: Secondary | ICD-10-CM | POA: Diagnosis not present

## 2022-04-22 DIAGNOSIS — J9601 Acute respiratory failure with hypoxia: Secondary | ICD-10-CM | POA: Diagnosis not present

## 2022-04-22 DIAGNOSIS — B3781 Candidal esophagitis: Secondary | ICD-10-CM | POA: Diagnosis present

## 2022-04-22 DIAGNOSIS — I10 Essential (primary) hypertension: Secondary | ICD-10-CM | POA: Diagnosis not present

## 2022-04-22 DIAGNOSIS — D62 Acute posthemorrhagic anemia: Secondary | ICD-10-CM | POA: Diagnosis not present

## 2022-04-22 DIAGNOSIS — D500 Iron deficiency anemia secondary to blood loss (chronic): Secondary | ICD-10-CM | POA: Diagnosis not present

## 2022-04-22 DIAGNOSIS — J121 Respiratory syncytial virus pneumonia: Principal | ICD-10-CM | POA: Diagnosis present

## 2022-04-22 DIAGNOSIS — R600 Localized edema: Secondary | ICD-10-CM | POA: Diagnosis not present

## 2022-04-22 DIAGNOSIS — R0602 Shortness of breath: Secondary | ICD-10-CM | POA: Diagnosis not present

## 2022-04-22 DIAGNOSIS — D6832 Hemorrhagic disorder due to extrinsic circulating anticoagulants: Secondary | ICD-10-CM | POA: Diagnosis not present

## 2022-04-22 DIAGNOSIS — J189 Pneumonia, unspecified organism: Secondary | ICD-10-CM | POA: Diagnosis present

## 2022-04-22 DIAGNOSIS — K2289 Other specified disease of esophagus: Secondary | ICD-10-CM | POA: Diagnosis not present

## 2022-04-22 DIAGNOSIS — Z8616 Personal history of COVID-19: Secondary | ICD-10-CM | POA: Diagnosis not present

## 2022-04-22 DIAGNOSIS — J449 Chronic obstructive pulmonary disease, unspecified: Secondary | ICD-10-CM | POA: Diagnosis not present

## 2022-04-22 DIAGNOSIS — J440 Chronic obstructive pulmonary disease with acute lower respiratory infection: Secondary | ICD-10-CM | POA: Diagnosis not present

## 2022-04-22 DIAGNOSIS — K449 Diaphragmatic hernia without obstruction or gangrene: Secondary | ICD-10-CM | POA: Diagnosis not present

## 2022-04-22 DIAGNOSIS — Z888 Allergy status to other drugs, medicaments and biological substances status: Secondary | ICD-10-CM

## 2022-04-22 DIAGNOSIS — E785 Hyperlipidemia, unspecified: Secondary | ICD-10-CM | POA: Diagnosis not present

## 2022-04-22 DIAGNOSIS — R918 Other nonspecific abnormal finding of lung field: Secondary | ICD-10-CM | POA: Diagnosis not present

## 2022-04-22 DIAGNOSIS — R7989 Other specified abnormal findings of blood chemistry: Secondary | ICD-10-CM | POA: Diagnosis not present

## 2022-04-22 DIAGNOSIS — K229 Disease of esophagus, unspecified: Secondary | ICD-10-CM | POA: Diagnosis not present

## 2022-04-22 DIAGNOSIS — Z87891 Personal history of nicotine dependence: Secondary | ICD-10-CM | POA: Diagnosis not present

## 2022-04-22 DIAGNOSIS — B379 Candidiasis, unspecified: Secondary | ICD-10-CM | POA: Diagnosis not present

## 2022-04-22 DIAGNOSIS — Z885 Allergy status to narcotic agent status: Secondary | ICD-10-CM

## 2022-04-22 DIAGNOSIS — I480 Paroxysmal atrial fibrillation: Secondary | ICD-10-CM | POA: Diagnosis not present

## 2022-04-22 DIAGNOSIS — Z1152 Encounter for screening for COVID-19: Secondary | ICD-10-CM

## 2022-04-22 DIAGNOSIS — K921 Melena: Secondary | ICD-10-CM | POA: Diagnosis not present

## 2022-04-22 DIAGNOSIS — J069 Acute upper respiratory infection, unspecified: Secondary | ICD-10-CM | POA: Diagnosis not present

## 2022-04-22 HISTORY — DX: Chronic obstructive pulmonary disease, unspecified: J44.9

## 2022-04-22 LAB — CBC WITH DIFFERENTIAL/PLATELET
Abs Immature Granulocytes: 0.11 10*3/uL — ABNORMAL HIGH (ref 0.00–0.07)
Basophils Absolute: 0 10*3/uL (ref 0.0–0.1)
Basophils Relative: 0 %
Eosinophils Absolute: 0 10*3/uL (ref 0.0–0.5)
Eosinophils Relative: 0 %
HCT: 35.8 % — ABNORMAL LOW (ref 39.0–52.0)
Hemoglobin: 11.6 g/dL — ABNORMAL LOW (ref 13.0–17.0)
Immature Granulocytes: 1 %
Lymphocytes Relative: 5 %
Lymphs Abs: 0.8 10*3/uL (ref 0.7–4.0)
MCH: 31.8 pg (ref 26.0–34.0)
MCHC: 32.4 g/dL (ref 30.0–36.0)
MCV: 98.1 fL (ref 80.0–100.0)
Monocytes Absolute: 1.4 10*3/uL — ABNORMAL HIGH (ref 0.1–1.0)
Monocytes Relative: 10 %
Neutro Abs: 11.6 10*3/uL — ABNORMAL HIGH (ref 1.7–7.7)
Neutrophils Relative %: 84 %
Platelets: 265 10*3/uL (ref 150–400)
RBC: 3.65 MIL/uL — ABNORMAL LOW (ref 4.22–5.81)
RDW: 11.9 % (ref 11.5–15.5)
WBC: 13.9 10*3/uL — ABNORMAL HIGH (ref 4.0–10.5)
nRBC: 0 % (ref 0.0–0.2)

## 2022-04-22 LAB — COMPREHENSIVE METABOLIC PANEL
ALT: 17 U/L (ref 0–44)
AST: 19 U/L (ref 15–41)
Albumin: 3.7 g/dL (ref 3.5–5.0)
Alkaline Phosphatase: 76 U/L (ref 38–126)
Anion gap: 10 (ref 5–15)
BUN: 13 mg/dL (ref 8–23)
CO2: 25 mmol/L (ref 22–32)
Calcium: 8.9 mg/dL (ref 8.9–10.3)
Chloride: 103 mmol/L (ref 98–111)
Creatinine, Ser: 1.22 mg/dL (ref 0.61–1.24)
GFR, Estimated: >60 mL/min (ref >60)
Glucose, Bld: 117 mg/dL — ABNORMAL HIGH (ref 70–99)
Potassium: 3.9 mmol/L (ref 3.5–5.1)
Sodium: 138 mmol/L (ref 135–145)
Total Bilirubin: 1 mg/dL (ref 0.3–1.2)
Total Protein: 6.9 g/dL (ref 6.5–8.1)

## 2022-04-22 LAB — RESP PANEL BY RT-PCR (RSV, FLU A&B, COVID)  RVPGX2
Influenza A by PCR: NEGATIVE
Influenza B by PCR: NEGATIVE
Resp Syncytial Virus by PCR: POSITIVE — AB
SARS Coronavirus 2 by RT PCR: NEGATIVE

## 2022-04-22 MED ORDER — SODIUM CHLORIDE 0.9 % IV SOLN
2.0000 g | INTRAVENOUS | Status: DC
  Administered 2022-04-22: 2 g via INTRAVENOUS
  Filled 2022-04-22: qty 20

## 2022-04-22 MED ORDER — IPRATROPIUM BROMIDE 0.02 % IN SOLN
0.5000 mg | Freq: Once | RESPIRATORY_TRACT | Status: AC
  Administered 2022-04-22: 0.5 mg via RESPIRATORY_TRACT
  Filled 2022-04-22: qty 2.5

## 2022-04-22 MED ORDER — SODIUM CHLORIDE 0.9 % IV SOLN
500.0000 mg | INTRAVENOUS | Status: DC
  Administered 2022-04-22: 500 mg via INTRAVENOUS
  Filled 2022-04-22: qty 5

## 2022-04-22 MED ORDER — METHYLPREDNISOLONE SODIUM SUCC 125 MG IJ SOLR
125.0000 mg | Freq: Once | INTRAMUSCULAR | Status: AC
  Administered 2022-04-22: 125 mg via INTRAVENOUS
  Filled 2022-04-22: qty 2

## 2022-04-22 MED ORDER — ACETAMINOPHEN 500 MG PO TABS
1000.0000 mg | ORAL_TABLET | Freq: Once | ORAL | Status: DC
Start: 2022-04-22 — End: 2022-04-26
  Filled 2022-04-22: qty 2

## 2022-04-22 MED ORDER — ALBUTEROL SULFATE (2.5 MG/3ML) 0.083% IN NEBU
10.0000 mg/h | INHALATION_SOLUTION | RESPIRATORY_TRACT | Status: DC
  Administered 2022-04-23: 10 mg/h via RESPIRATORY_TRACT
  Filled 2022-04-22: qty 12

## 2022-04-22 NOTE — ED Notes (Signed)
Patient in room at this time

## 2022-04-22 NOTE — ED Provider Notes (Signed)
[facility] EMERGENCY DEPARTMENT Provider Note  CSN: [id number] Arrival date & time: 04/22/22 1458  Chief Complaint(s) Shortness of Breath  HPI Kenneth Weber is a 71 y.o. 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  He reports that he was diagnosed with RSV infection 10 days ago and since then has been having worsening shortness of breath.  He has persistent cough.  No noted fevers or chills.  No chest pain.  Shortness of breath was initially with activity and now at rest.  He does endorse bilateral lower extremity edema but no orthopnea.  No prior history of heart failure.  No prolonged immobilization, recent travel or prior PEs.  The history is provided by the patient.    Past Medical History Past Medical History:  Diagnosis Date   Allergic rhinitis    Chest pain    COPD (chronic obstructive pulmonary disease) (HCC)    ED (erectile dysfunction)    Hyperlipidemia    Hypertension    SOB (shortness of breath)    Patient Active Problem List   Diagnosis Date Noted   Former smoker 12/10/2021   COPD GOLD 3  10/10/2020   Home Medication(s) Prior to Admission medications   Medication Sig Start Date End Date Taking? Authorizing Provider  acetaminophen (TYLENOL) 325 MG tablet Take 650 mg by mouth every 6 (six) hours as needed.    [provider]  albuterol (VENTOLIN HFA) 108 (90 Base) MCG/ACT inhaler Up to 2 puffs every 4 hours as needed 12/10/21   Tanda Rockers, MD  amLODipine (NORVASC) 5 MG tablet Take 5 mg by mouth daily.    [provider]  Ascorbic Acid (VITAMIN C) 100 MG tablet Take 100 mg by mouth daily.    [provider]  Budeson-Glycopyrrol-Formoterol (BREZTRI AEROSPHERE) 160-9-4.8 MCG/ACT AERO Take 2 puffs first thing in am and then another 2 puffs about 12 hours later.  01/10/22   Tanda Rockers, MD  cloNIDine (CATAPRES) 0.1 MG tablet Take 2 tablets (0.2 mg total) by mouth every 12 (twelve) hours as needed. 01/01/22   Veryl Speak, MD  Elderberry 500 MG CAPS Take 1 tablet by mouth daily.    [provider]  latanoprost (XALATAN) 0.005 % ophthalmic solution  09/07/19   [provider]  olmesartan (BENICAR) 20 MG tablet Take 40 mg by mouth daily.    [provider]  Omega-3 Fatty Acids (FISH OIL) 1000 MG CAPS Take 2 capsules by mouth daily.    [provider]  timolol (TIMOPTIC) 0.5 % ophthalmic solution 1 drop every morning. 08/17/19   [provider]  Allergies Other, Streptogramins, Codeine, Lisinopril, and Losartan potassium-hctz  Review of Systems Review of Systems As noted in HPI  Physical Exam Vital Signs  I have reviewed the triage vital signs BP (!) 142/97 (BP Location: Right Arm)   Pulse (!) 101   Temp 97.7 F (36.5 C) (Oral)   Resp 19   Ht 6\' 1"  (1.854 m)   Wt 84.8 kg   SpO2 90%   BMI 24.67 kg/m   Physical Exam Vitals reviewed.  Constitutional:      General: He is not in acute distress.    Appearance: He is well-developed. He is not diaphoretic.  HENT:     Head: Normocephalic and atraumatic.     Nose: Nose normal.  Eyes:     General: No scleral icterus.       Right eye: No discharge.        Left eye: No discharge.     Conjunctiva/sclera: Conjunctivae normal.     Pupils: Pupils are equal, round, and reactive to light.  Cardiovascular:     Rate and Rhythm: Normal rate and regular rhythm.     Heart sounds: No murmur heard.    No friction rub. No gallop.  Pulmonary:     Effort: Pulmonary effort is normal. No respiratory distress.     Breath sounds: Decreased air movement present. No stridor. Examination of the right-middle field reveals rales. Examination of  the left-lower field reveals rales. Wheezing (faint) and rales present.  Abdominal:     General: There is no distension.     Palpations: Abdomen is soft.     Tenderness: There is no abdominal tenderness.  Musculoskeletal:        General: No tenderness.     Cervical back: Normal range of motion and neck supple.     Right lower leg: 1+ Pitting Edema present.     Left lower leg: 1+ Pitting Edema present.  Skin:    General: Skin is warm and dry.     Findings: No erythema or rash.  Neurological:     Mental Status: He is alert and oriented to person, place, and time.     ED Results and Treatments Labs (all labs ordered are listed, but only abnormal results are displayed) Labs Reviewed  RESP PANEL BY RT-PCR (RSV, FLU A&B, COVID)  RVPGX2 - Abnormal; Notable for the following components:      Result Value   Resp Syncytial Virus by PCR POSITIVE (*)    All other components within normal limits  COMPREHENSIVE METABOLIC PANEL - Abnormal; Notable for the following components:   Glucose, Bld 117 (*)    All other components within normal limits  CBC WITH DIFFERENTIAL/PLATELET - Abnormal; Notable for the following components:   WBC 13.9 (*)    RBC 3.65 (*)    Hemoglobin 11.6 (*)    HCT 35.8 (*)    Neutro Abs 11.6 (*)    Monocytes Absolute 1.4 (*)    Abs Immature Granulocytes 0.11 (*)    All other components within normal limits  CULTURE, BLOOD (ROUTINE X 2)  CULTURE, BLOOD (ROUTINE X 2)  BRAIN NATRIURETIC PEPTIDE  LACTIC ACID, PLASMA  LACTIC ACID, PLASMA  EKG  EKG Interpretation  Date/Time:  Tuesday April 23 2022 00:28:04 EST Ventricular Rate:  105 PR Interval:  160 QRS Duration: 102 QT Interval:  348 QTC Calculation: 460 R Axis:   -22 Text Interpretation: Sinus tachycardia Borderline left axis deviation Confirmed by Addison Lank [phone]) on 04/23/2022  1:11:54 AM       Radiology DG Chest 2 View  Result Date: 04/22/2022 CLINICAL DATA:  Shortness of breath, cough EXAM: CHEST - 2 VIEW COMPARISON:  Previous examinations including the study done earlier today FINDINGS: Cardiac size is within normal limits. Increase in AP diameter of chest suggests COPD. Small patchy infiltrate is seen in left lower lung field. This finding has not changed significantly in comparison with the immediate previous study done earlier today. This finding was not distinctly seen in an earlier examination done on 05/29/2021. In the lateral view, the infiltrate appears to be anterior, possibly within lingula. There is peribronchial thickening. There is no pleural effusion or pneumothorax. IMPRESSION: There is small patchy infiltrate in left lower lung field suggesting interstitial pneumonia. There are no new infiltrates or signs of pulmonary edema in comparison with the study done earlier today. There is no pleural effusion. Electronically Signed   By: Elmer Picker M.D.   On: 04/22/2022 17:30    Medications Ordered in ED Medications  acetaminophen (TYLENOL) tablet 1,000 mg (1,000 mg Oral Not Given 04/22/22 2317)  cefTRIAXone (ROCEPHIN) 2 g in sodium chloride 0.9 % 100 mL IVPB (2 g Intravenous New Bag/Given 04/22/22 2349)  azithromycin (ZITHROMAX) 500 mg in sodium chloride 0.9 % 250 mL IVPB (500 mg Intravenous New Bag/Given 04/22/22 2350)  albuterol (PROVENTIL) (2.5 MG/3ML) 0.083% nebulizer solution (10 mg/hr Nebulization New Bag/Given 04/23/22 0000)  methylPREDNISolone sodium succinate (SOLU-MEDROL) 125 mg/2 mL injection 125 mg (125 mg Intravenous Given 04/22/22 2346)  ipratropium (ATROVENT) nebulizer solution 0.5 mg (0.5 mg Nebulization Given 04/22/22 2351)                                                                                                                                     Procedures .1-3 Lead EKG Interpretation  Performed by: Fatima Blank, MD Authorized by: Fatima Blank, MD     Interpretation: normal     ECG rate:  99   ECG rate assessment: normal     Rhythm: sinus rhythm     Ectopy: none     Conduction: normal   .Critical Care  Performed by: Fatima Blank, MD Authorized by: Fatima Blank, MD   Critical care provider statement:    Critical care time (minutes):  45   Critical care time was exclusive of:  Separately billable procedures and treating other patients   Critical care was necessary to treat or prevent imminent or life-threatening deterioration of the following conditions:  Respiratory failure and sepsis   Critical care was time spent personally by me on the following  activities:  Development of treatment plan with patient or surrogate, discussions with consultants, evaluation of patient's response to treatment, examination of patient, obtaining history from patient or surrogate, review of old charts, re-evaluation of patient's condition, pulse oximetry, ordering and review of radiographic studies, ordering and review of laboratory studies and ordering and performing treatments and interventions   Care discussed with: admitting provider     (including critical care time)  Medical Decision Making / ED Course   Medical Decision Making Amount and/or Complexity of Data Reviewed Labs: ordered. Decision-making details documented in ED Course. Radiology: ordered and independent interpretation performed. Decision-making details documented in ED Course. ECG/medicine tests: ordered and independent interpretation performed. Decision-making details documented in ED Course.  Risk Prescription drug management. Decision regarding hospitalization.    Cough and shortness of breath. Noted to be hypoxic on room air with sats of 84%.  Patient reports prior RSV infection.  Differential includes COPD exacerbation, postviral pneumonia.  With evidence of peripheral edema, also considering  heart failure though this may be secondary to the patient's Norvasc.  Lower suspicion for pulmonary embolism.  EKG with sinus tachycardia.  No dysrhythmias.  No acute ischemic changes.  CBC with leukocytosis.  Mild anemia. Metabolic panel without significant electrolyte derangements or renal sufficiency. Viral panel positive for RSV. Lactic acid pending BNP pending Chest x-ray with  evidence of left lower pneumonia.  No evidence of pneumothorax, pulmonary edema or pleural effusions.  Code sepsis initiated and patient started on empiric antibiotics for community-acquired pneumonia. He was also given continuous nebs with Atrovent and Solu-Medrol.  Requires admission to medicine for further workup and management. I spoke with Dr. Myna Hidalgo from the hospitalist service who agreed to admit patient.       Final Clinical Impression(s) / ED Diagnoses Final diagnoses:  Acute respiratory failure with hypoxia ([place])  Community acquired pneumonia of left lower lobe of lung           This chart was dictated using voice recognition software.  Despite best efforts to proofread,  errors can occur which can change the documentation meaning.    Fatima Blank, MD 04/23/22 [phone]

## 2022-04-22 NOTE — ED Provider Triage Note (Signed)
Emergency Medicine Provider Triage Evaluation Note  Kenneth Weber , a 71 y.o. male  was evaluated in triage.  Pt complains of recent RSV diagnosis and worsening cough. Patient states that he has a history of COPD but has been having a harder time breathing due to persistent cough. Denies abdominal pain, nausea, vomiting, or diarrhea. States that his PCP advised him to come in because they were concerned about possible pneumonia.  Review of Systems  Positive: As above Negative: As above  Physical Exam  There were no vitals taken for this visit. Gen:   Awake, no distress, uncomfortable Resp:  Normal effort, pleural friction rub noted in upper lobes. No obvious wheezing or crackles. MSK:   Moves extremities without difficulty  Other:    Medical Decision Making  Medically screening exam initiated at 3:49 PM.  Appropriate orders placed.  Kenneth Weber was informed that the remainder of the evaluation will be completed by another provider, this initial triage assessment does not replace that evaluation, and the importance of remaining in the ED until their evaluation is complete.     Smitty Knudsen, PA-C 04/22/22 1553

## 2022-04-22 NOTE — ED Notes (Signed)
Went to patient room, patient not in room at this time.  

## 2022-04-22 NOTE — ED Triage Notes (Signed)
Pt to ED pov from UC. Pt was diagnosed with RSV 9 days ago, was given medication and has since had increasing shortness of breath.  Pt called PCP who instructed pt to go to UC today, was diagnosed with PNA at [facility] and sent to ED.  Pt A&Ox4, RR=26, 02 sat=84% in triage. Pt placed on 2LNC, 02 sat=90%.

## 2022-04-22 NOTE — ED Notes (Addendum)
Increaswed O2 to 4L and SpO2 increased to 92%

## 2022-04-23 ENCOUNTER — Encounter (HOSPITAL_COMMUNITY): Payer: Self-pay | Admitting: Family Medicine

## 2022-04-23 DIAGNOSIS — I4891 Unspecified atrial fibrillation: Secondary | ICD-10-CM | POA: Diagnosis not present

## 2022-04-23 DIAGNOSIS — J441 Chronic obstructive pulmonary disease with (acute) exacerbation: Secondary | ICD-10-CM | POA: Diagnosis present

## 2022-04-23 DIAGNOSIS — Z888 Allergy status to other drugs, medicaments and biological substances status: Secondary | ICD-10-CM | POA: Diagnosis not present

## 2022-04-23 DIAGNOSIS — I10 Essential (primary) hypertension: Secondary | ICD-10-CM | POA: Diagnosis not present

## 2022-04-23 DIAGNOSIS — R600 Localized edema: Secondary | ICD-10-CM | POA: Diagnosis not present

## 2022-04-23 DIAGNOSIS — J449 Chronic obstructive pulmonary disease, unspecified: Secondary | ICD-10-CM | POA: Diagnosis not present

## 2022-04-23 DIAGNOSIS — B3781 Candidal esophagitis: Secondary | ICD-10-CM | POA: Diagnosis present

## 2022-04-23 DIAGNOSIS — I480 Paroxysmal atrial fibrillation: Secondary | ICD-10-CM | POA: Diagnosis present

## 2022-04-23 DIAGNOSIS — Z885 Allergy status to narcotic agent status: Secondary | ICD-10-CM | POA: Diagnosis not present

## 2022-04-23 DIAGNOSIS — K449 Diaphragmatic hernia without obstruction or gangrene: Secondary | ICD-10-CM | POA: Diagnosis present

## 2022-04-23 DIAGNOSIS — J440 Chronic obstructive pulmonary disease with acute lower respiratory infection: Secondary | ICD-10-CM | POA: Diagnosis present

## 2022-04-23 DIAGNOSIS — J9601 Acute respiratory failure with hypoxia: Secondary | ICD-10-CM | POA: Diagnosis present

## 2022-04-23 DIAGNOSIS — J189 Pneumonia, unspecified organism: Secondary | ICD-10-CM | POA: Diagnosis not present

## 2022-04-23 DIAGNOSIS — R7989 Other specified abnormal findings of blood chemistry: Secondary | ICD-10-CM | POA: Diagnosis not present

## 2022-04-23 DIAGNOSIS — Z7951 Long term (current) use of inhaled steroids: Secondary | ICD-10-CM | POA: Diagnosis not present

## 2022-04-23 DIAGNOSIS — Z1152 Encounter for screening for COVID-19: Secondary | ICD-10-CM | POA: Diagnosis not present

## 2022-04-23 DIAGNOSIS — K2289 Other specified disease of esophagus: Secondary | ICD-10-CM | POA: Diagnosis not present

## 2022-04-23 DIAGNOSIS — Z8616 Personal history of COVID-19: Secondary | ICD-10-CM | POA: Diagnosis not present

## 2022-04-23 DIAGNOSIS — J121 Respiratory syncytial virus pneumonia: Secondary | ICD-10-CM | POA: Diagnosis present

## 2022-04-23 DIAGNOSIS — D62 Acute posthemorrhagic anemia: Secondary | ICD-10-CM | POA: Diagnosis not present

## 2022-04-23 DIAGNOSIS — E785 Hyperlipidemia, unspecified: Secondary | ICD-10-CM | POA: Diagnosis present

## 2022-04-23 DIAGNOSIS — I4819 Other persistent atrial fibrillation: Secondary | ICD-10-CM | POA: Diagnosis not present

## 2022-04-23 DIAGNOSIS — D500 Iron deficiency anemia secondary to blood loss (chronic): Secondary | ICD-10-CM | POA: Diagnosis not present

## 2022-04-23 DIAGNOSIS — K5900 Constipation, unspecified: Secondary | ICD-10-CM | POA: Diagnosis present

## 2022-04-23 DIAGNOSIS — K921 Melena: Secondary | ICD-10-CM | POA: Diagnosis not present

## 2022-04-23 DIAGNOSIS — D6832 Hemorrhagic disorder due to extrinsic circulating anticoagulants: Secondary | ICD-10-CM | POA: Diagnosis not present

## 2022-04-23 DIAGNOSIS — Z79899 Other long term (current) drug therapy: Secondary | ICD-10-CM | POA: Diagnosis not present

## 2022-04-23 DIAGNOSIS — F17200 Nicotine dependence, unspecified, uncomplicated: Secondary | ICD-10-CM | POA: Diagnosis present

## 2022-04-23 LAB — LACTIC ACID, PLASMA
Lactic Acid, Venous: 0.8 mmol/L (ref 0.5–1.9)
Lactic Acid, Venous: 1.3 mmol/L (ref 0.5–1.9)

## 2022-04-23 LAB — BASIC METABOLIC PANEL
Anion gap: 12 (ref 5–15)
BUN: 15 mg/dL (ref 8–23)
CO2: 22 mmol/L (ref 22–32)
Calcium: 8.6 mg/dL — ABNORMAL LOW (ref 8.9–10.3)
Chloride: 102 mmol/L (ref 98–111)
Creatinine, Ser: 1.31 mg/dL — ABNORMAL HIGH (ref 0.61–1.24)
GFR, Estimated: 59 mL/min — ABNORMAL LOW (ref >60)
Glucose, Bld: 154 mg/dL — ABNORMAL HIGH (ref 70–99)
Potassium: 3.6 mmol/L (ref 3.5–5.1)
Sodium: 136 mmol/L (ref 135–145)

## 2022-04-23 LAB — CBC
HCT: 33 % — ABNORMAL LOW (ref 39.0–52.0)
Hemoglobin: 11.1 g/dL — ABNORMAL LOW (ref 13.0–17.0)
MCH: 32.6 pg (ref 26.0–34.0)
MCHC: 33.6 g/dL (ref 30.0–36.0)
MCV: 96.8 fL (ref 80.0–100.0)
Platelets: 256 10*3/uL (ref 150–400)
RBC: 3.41 MIL/uL — ABNORMAL LOW (ref 4.22–5.81)
RDW: 12 % (ref 11.5–15.5)
WBC: 14 10*3/uL — ABNORMAL HIGH (ref 4.0–10.5)
nRBC: 0 % (ref 0.0–0.2)

## 2022-04-23 LAB — HIV ANTIBODY (ROUTINE TESTING W REFLEX): HIV Screen 4th Generation wRfx: NONREACTIVE

## 2022-04-23 LAB — PROCALCITONIN
Procalcitonin: 0.18 ng/mL
Procalcitonin: 0.24 ng/mL

## 2022-04-23 LAB — EXPECTORATED SPUTUM ASSESSMENT W GRAM STAIN, RFLX TO RESP C

## 2022-04-23 LAB — MAGNESIUM: Magnesium: 1.9 mg/dL (ref 1.7–2.4)

## 2022-04-23 LAB — STREP PNEUMONIAE URINARY ANTIGEN: Strep Pneumo Urinary Antigen: NEGATIVE

## 2022-04-23 LAB — D-DIMER, QUANTITATIVE: D-Dimer, Quant: 0.97 ug/mL-FEU — ABNORMAL HIGH (ref 0.00–0.50)

## 2022-04-23 LAB — BRAIN NATRIURETIC PEPTIDE: B Natriuretic Peptide: 36.6 pg/mL (ref 0.0–100.0)

## 2022-04-23 MED ORDER — ONDANSETRON HCL 4 MG PO TABS: 4.0000 mg | ORAL_TABLET | Freq: Four times a day (QID) | ORAL | Status: DC | PRN

## 2022-04-23 MED ORDER — ACETAMINOPHEN 650 MG RE SUPP: 650.0000 mg | Freq: Four times a day (QID) | RECTAL | Status: DC | PRN

## 2022-04-23 MED ORDER — HYDRALAZINE HCL 25 MG PO TABS: 25.0000 mg | ORAL_TABLET | Freq: Three times a day (TID) | ORAL | Status: DC | PRN

## 2022-04-23 MED ORDER — POLYETHYLENE GLYCOL 3350 17 G PO PACK
17.0000 g | PACK | Freq: Every day | ORAL | Status: DC | PRN
  Administered 2022-04-25: 17 g via ORAL
  Filled 2022-04-23: qty 1

## 2022-04-23 MED ORDER — ARFORMOTEROL TARTRATE 15 MCG/2ML IN NEBU
15.0000 ug | INHALATION_SOLUTION | Freq: Two times a day (BID) | RESPIRATORY_TRACT | Status: DC
  Administered 2022-04-23 – 2022-04-30 (×15): 15 ug via RESPIRATORY_TRACT
  Filled 2022-04-23 (×17): qty 2

## 2022-04-23 MED ORDER — ENOXAPARIN SODIUM 40 MG/0.4ML IJ SOSY
40.0000 mg | PREFILLED_SYRINGE | Freq: Every day | INTRAMUSCULAR | Status: DC
  Administered 2022-04-23 – 2022-04-25 (×3): 40 mg via SUBCUTANEOUS
  Filled 2022-04-23 (×3): qty 0.4

## 2022-04-23 MED ORDER — GUAIFENESIN ER 600 MG PO TB12
600.0000 mg | ORAL_TABLET | Freq: Two times a day (BID) | ORAL | Status: DC
  Administered 2022-04-23 – 2022-05-01 (×16): 600 mg via ORAL
  Filled 2022-04-23 (×16): qty 1

## 2022-04-23 MED ORDER — AZITHROMYCIN 500 MG PO TABS
500.0000 mg | ORAL_TABLET | Freq: Every day | ORAL | Status: AC
  Administered 2022-04-23 – 2022-04-26 (×4): 500 mg via ORAL
  Filled 2022-04-23 (×4): qty 1

## 2022-04-23 MED ORDER — SODIUM CHLORIDE 0.9 % IV SOLN
2.0000 g | INTRAVENOUS | Status: AC
  Administered 2022-04-23 – 2022-04-26 (×4): 2 g via INTRAVENOUS
  Filled 2022-04-23 (×4): qty 20

## 2022-04-23 MED ORDER — ACETAMINOPHEN 325 MG PO TABS
650.0000 mg | ORAL_TABLET | Freq: Four times a day (QID) | ORAL | Status: DC | PRN
  Administered 2022-04-23 – 2022-04-27 (×5): 650 mg via ORAL
  Filled 2022-04-23 (×6): qty 2

## 2022-04-23 MED ORDER — PREDNISONE 20 MG PO TABS
40.0000 mg | ORAL_TABLET | Freq: Every day | ORAL | Status: DC
  Administered 2022-04-24 – 2022-04-25 (×2): 40 mg via ORAL
  Filled 2022-04-23 (×3): qty 2

## 2022-04-23 MED ORDER — METHYLPREDNISOLONE SODIUM SUCC 125 MG IJ SOLR
125.0000 mg | Freq: Two times a day (BID) | INTRAMUSCULAR | Status: AC
  Administered 2022-04-23 (×2): 125 mg via INTRAVENOUS
  Filled 2022-04-23 (×2): qty 2

## 2022-04-23 MED ORDER — OXYCODONE HCL 5 MG PO TABS: 5.0000 mg | ORAL_TABLET | ORAL | Status: DC | PRN

## 2022-04-23 MED ORDER — SODIUM CHLORIDE 0.9% FLUSH
3.0000 mL | Freq: Two times a day (BID) | INTRAVENOUS | Status: DC
  Administered 2022-04-23 – 2022-05-01 (×14): 3 mL via INTRAVENOUS

## 2022-04-23 MED ORDER — ONDANSETRON HCL 4 MG/2ML IJ SOLN: 4.0000 mg | Freq: Four times a day (QID) | INTRAMUSCULAR | Status: DC | PRN

## 2022-04-23 MED ORDER — AMLODIPINE BESYLATE 5 MG PO TABS: 5.0000 mg | ORAL_TABLET | Freq: Every day | ORAL | Status: DC

## 2022-04-23 MED ORDER — IRBESARTAN 300 MG PO TABS
300.0000 mg | ORAL_TABLET | Freq: Every day | ORAL | Status: DC
  Filled 2022-04-23: qty 1

## 2022-04-23 MED ORDER — BUDESONIDE 0.5 MG/2ML IN SUSP
0.5000 mg | Freq: Two times a day (BID) | RESPIRATORY_TRACT | Status: DC
  Administered 2022-04-23 – 2022-04-30 (×15): 0.5 mg via RESPIRATORY_TRACT
  Filled 2022-04-23 (×17): qty 2

## 2022-04-23 MED ORDER — IPRATROPIUM-ALBUTEROL 0.5-2.5 (3) MG/3ML IN SOLN
3.0000 mL | Freq: Four times a day (QID) | RESPIRATORY_TRACT | Status: DC
  Administered 2022-04-23 – 2022-04-24 (×4): 3 mL via RESPIRATORY_TRACT
  Filled 2022-04-23 (×4): qty 3

## 2022-04-23 MED ORDER — ALBUTEROL SULFATE (2.5 MG/3ML) 0.083% IN NEBU: 2.5000 mg | INHALATION_SOLUTION | RESPIRATORY_TRACT | Status: DC | PRN

## 2022-04-23 MED ORDER — DOXYLAMINE SUCCINATE (SLEEP) 25 MG PO TABS
25.0000 mg | ORAL_TABLET | Freq: Every evening | ORAL | Status: AC | PRN
  Administered 2022-04-23: 25 mg via ORAL
  Filled 2022-04-23: qty 1

## 2022-04-23 NOTE — Sepsis Progress Note (Signed)
Elink following for Sepsis Protocol 

## 2022-04-23 NOTE — H&P (Signed)
History and Physical    Kenneth Weber XLK:[id number] DOB: 1951-04-30 DOA: 04/22/2022  PCP: [name], MD   Patient coming from: home   Chief Complaint: SOB, productive cough  HPI: Kenneth Weber is a pleasant 71 y.o. male with medical history significant for hypertension and COPD who presents to the emergency department with productive cough and shortness of breath.  Patient reports that he developed a fever the night of 04/12/2022 and then a nonproductive cough.  He was seen at an urgent care the following day where he was diagnosed with RSV.  He was given a cough medication and nasal spray but did not find much relief with this and went on to develop shortness of breath and productive cough.  He returned to the urgent care 04/22/2022 where he was diagnosed with pneumonia and directed to the ED.  ED Course: Upon arrival to the ED, patient is found to be afebrile and saturating 84% on room air with mild tachypnea and tachycardia.  Chest x-ray notable for patchy infiltrate in the left lower lobe.  Blood work notable for WBC of 13,900, lactic acid of 0.8, and BNP 37.  Blood cultures were collected in the emergency department and the patient was treated with acetaminophen, albuterol, Atrovent, IV Solu-Medrol, Rocephin, and azithromycin.  He was also started on supplemental oxygen in the ED, currently requiring 3 L/min.  Review of Systems:  All other systems reviewed and apart from HPI, are negative.  Past Medical History:  Diagnosis Date   Allergic rhinitis    Chest pain    COPD (chronic obstructive pulmonary disease) (HCC)    ED (erectile dysfunction)    Hyperlipidemia    Hypertension    SOB (shortness of breath)     History reviewed. No pertinent surgical history.  Social History:   reports that he quit smoking about 8 years ago. His smoking use included cigarettes. He has a 60.00 pack-year smoking history. He has never used smokeless tobacco. He reports that he does not drink  alcohol and does not use drugs.  Allergies  Allergen Reactions   Other    Streptogramins    Codeine Nausea And Vomiting   Lisinopril Cough   Losartan Potassium-Hctz Other (See Comments)    Erectile Dysfunction    History reviewed. No pertinent family history.   Prior to Admission medications   Medication Sig Start Date End Date Taking? Authorizing Provider  acetaminophen (TYLENOL) 325 MG tablet Take 650 mg by mouth every 6 (six) hours as needed.    [provider]  albuterol (VENTOLIN HFA) 108 (90 Base) MCG/ACT inhaler Up to 2 puffs every 4 hours as needed 12/10/21   Nyoka Cowden, MD  amLODipine (NORVASC) 5 MG tablet Take 5 mg by mouth daily.    [provider]  Ascorbic Acid (VITAMIN C) 100 MG tablet Take 100 mg by mouth daily.    [provider]  Budeson-Glycopyrrol-Formoterol (BREZTRI AEROSPHERE) 160-9-4.8 MCG/ACT AERO Take 2 puffs first thing in am and then another 2 puffs about 12 hours later. 01/10/22   Nyoka Cowden, MD  cloNIDine (CATAPRES) 0.1 MG tablet Take 2 tablets (0.2 mg total) by mouth every 12 (twelve) hours as needed. 01/01/22   Geoffery Lyons, MD  Elderberry 500 MG CAPS Take 1 tablet by mouth daily.    [provider]  latanoprost (XALATAN) 0.005 % ophthalmic solution  09/07/19   [provider]  olmesartan (BENICAR) 20 MG tablet Take 40 mg by mouth daily.  [provider]  Omega-3 Fatty Acids (FISH OIL) 1000 MG CAPS Take 2 capsules by mouth daily.    [provider]  timolol (TIMOPTIC) 0.5 % ophthalmic solution 1 drop every morning. 08/17/19   [provider]    Physical Exam: Vitals:   04/22/22 2330 04/23/22 0000 04/23/22 0030 04/23/22 0108  BP: (!) 154/82 (!) 148/84 135/72 114/67  Pulse: 96 100 (!) 146 (!) 104  Resp: (!) 30 (!) 28 19 (!) 24  Temp:    98.1 F (36.7 C)  TempSrc:    Oral  SpO2: 93% 96% 94% 90%  Weight:      Height:        Constitutional: NAD, calm  Eyes: PERTLA, lids  and conjunctivae normal ENMT: Mucous membranes are moist. Posterior pharynx clear of any exudate or lesions.   Neck: supple, no masses  Respiratory: coarse rales on left, prolonged expiratory phase, wheezes. No accessory muscle use.  Cardiovascular: S1 & S2 heard, regular rate and rhythm. Pretibial pitting edema bilaterally.  Abdomen: No distension, no tenderness, soft. Bowel sounds active.  Musculoskeletal: no clubbing / cyanosis. No joint deformity upper and lower extremities.   Skin: no significant rashes, lesions, ulcers. Warm, dry, well-perfused. Neurologic: CN 2-12 grossly intact. Moving all extremities. Alert and oriented.  Psychiatric: Pleasant. Cooperative.    Labs and Imaging on Admission: I have personally reviewed following labs and imaging studies  CBC: Recent Labs  Lab 04/22/22 1607  WBC 13.9*  NEUTROABS 11.6*  HGB 11.6*  HCT 35.8*  MCV 98.1  PLT 99991111   Basic Metabolic Panel: Recent Labs  Lab 04/22/22 1607  NA 138  K 3.9  CL 103  CO2 25  GLUCOSE 117*  BUN 13  CREATININE 1.22  CALCIUM 8.9   GFR: Estimated Creatinine Clearance: 63.7 mL/min (by C-G formula based on SCr of 1.22 mg/dL). Liver Function Tests: Recent Labs  Lab 04/22/22 1607  AST 19  ALT 17  ALKPHOS 76  BILITOT 1.0  PROT 6.9  ALBUMIN 3.7   No results for input(s): "LIPASE", "AMYLASE" in the last 168 hours. No results for input(s): "AMMONIA" in the last 168 hours. Coagulation Profile: No results for input(s): "INR", "PROTIME" in the last 168 hours. Cardiac Enzymes: No results for input(s): "CKTOTAL", "CKMB", "CKMBINDEX", "TROPONINI" in the last 168 hours. BNP (last 3 results) No results for input(s): "PROBNP" in the last 8760 hours. HbA1C: No results for input(s): "HGBA1C" in the last 72 hours. CBG: No results for input(s): "GLUCAP" in the last 168 hours. Lipid Profile: No results for input(s): "CHOL", "HDL", "LDLCALC", "TRIG", "CHOLHDL", "LDLDIRECT" in the last 72 hours. Thyroid  Function Tests: No results for input(s): "TSH", "T4TOTAL", "FREET4", "T3FREE", "THYROIDAB" in the last 72 hours. Anemia Panel: No results for input(s): "VITAMINB12", "FOLATE", "FERRITIN", "TIBC", "IRON", "RETICCTPCT" in the last 72 hours. Urine analysis: No results found for: "COLORURINE", "APPEARANCEUR", "LABSPEC", "PHURINE", "GLUCOSEU", "HGBUR", "BILIRUBINUR", "KETONESUR", "PROTEINUR", "UROBILINOGEN", "NITRITE", "LEUKOCYTESUR" Sepsis Labs: @LABRCNTIP (procalcitonin:4,lacticidven:4) ) Recent Results (from the past 240 hour(s))  Resp panel by RT-PCR (RSV, Flu A&B, Covid) Anterior Nasal Swab     Status: Abnormal   Collection Time: 04/22/22  4:51 PM   Specimen: Anterior Nasal Swab  Result Value Ref Range Status   SARS Coronavirus 2 by RT PCR NEGATIVE NEGATIVE Final    Comment: (NOTE) SARS-CoV-2 target nucleic acids are NOT DETECTED.  The SARS-CoV-2 RNA is generally detectable in upper respiratory specimens during the acute phase of infection. The lowest concentration of SARS-CoV-2 viral copies  this assay can detect is 138 copies/mL. A negative result does not preclude SARS-Cov-2 infection and should not be used as the sole basis for treatment or other patient management decisions. A negative result may occur with  improper specimen collection/handling, submission of specimen other than nasopharyngeal swab, presence of viral mutation(s) within the areas targeted by this assay, and inadequate number of viral copies(<138 copies/mL). A negative result must be combined with clinical observations, patient history, and epidemiological information. The expected result is Negative.  Fact Sheet for Patients:  EntrepreneurPulse.com.au  Fact Sheet for Healthcare Providers:  IncredibleEmployment.be  This test is no t yet approved or cleared by the [place] FDA and  has been authorized for detection and/or diagnosis of SARS-CoV-2 by FDA under an Emergency  Use Authorization (EUA). This EUA will remain  in effect (meaning this test can be used) for the duration of the COVID-19 declaration under Section 564(b)(1) of the Act, 21 U.S.C.section 360bbb-3(b)(1), unless the authorization is terminated  or revoked sooner.       Influenza A by PCR NEGATIVE NEGATIVE Final   Influenza B by PCR NEGATIVE NEGATIVE Final    Comment: (NOTE) The Xpert Xpress SARS-CoV-2/FLU/RSV plus assay is intended as an aid in the diagnosis of influenza from Nasopharyngeal swab specimens and should not be used as a sole basis for treatment. Nasal washings and aspirates are unacceptable for Xpert Xpress SARS-CoV-2/FLU/RSV testing.  Fact Sheet for Patients: EntrepreneurPulse.com.au  Fact Sheet for Healthcare Providers: IncredibleEmployment.be  This test is not yet approved or cleared by the [place] FDA and has been authorized for detection and/or diagnosis of SARS-CoV-2 by FDA under an Emergency Use Authorization (EUA). This EUA will remain in effect (meaning this test can be used) for the duration of the COVID-19 declaration under Section 564(b)(1) of the Act, 21 U.S.C. section 360bbb-3(b)(1), unless the authorization is terminated or revoked.     Resp Syncytial Virus by PCR POSITIVE (A) NEGATIVE Final    Comment: (NOTE) Fact Sheet for Patients: EntrepreneurPulse.com.au  Fact Sheet for Healthcare Providers: IncredibleEmployment.be  This test is not yet approved or cleared by the [place] FDA and has been authorized for detection and/or diagnosis of SARS-CoV-2 by FDA under an Emergency Use Authorization (EUA). This EUA will remain in effect (meaning this test can be used) for the duration of the COVID-19 declaration under Section 564(b)(1) of the Act, 21 U.S.C. section 360bbb-3(b)(1), unless the authorization is terminated or revoked.  Performed at [facility],  [place] [address] [place], [place] [address]      Radiological Exams on Admission: DG Chest 2 View  Result Date: 04/22/2022 CLINICAL DATA:  Shortness of breath, cough EXAM: CHEST - 2 VIEW COMPARISON:  Previous examinations including the study done earlier today FINDINGS: Cardiac size is within normal limits. Increase in AP diameter of chest suggests COPD. Small patchy infiltrate is seen in left lower lung field. This finding has not changed significantly in comparison with the immediate previous study done earlier today. This finding was not distinctly seen in an earlier examination done on 05/29/2021. In the lateral view, the infiltrate appears to be anterior, possibly within lingula. There is peribronchial thickening. There is no pleural effusion or pneumothorax. IMPRESSION: There is small patchy infiltrate in left lower lung field suggesting interstitial pneumonia. There are no new infiltrates or signs of pulmonary edema in comparison with the study done earlier today. There is no pleural effusion. Electronically Signed   By: Prudy Feeler.D.  On: 04/22/2022 17:30    EKG: Independently reviewed. Sinus tachycardia, rate 105.   Assessment/Plan   1. Pneumonia; acute hypoxic respiratory failure  - Blood cultures collected in ED and [place] and azithromycin were started  - He is not septic on admission  - Check strep pneumo and legionella antigens, continue Rocephin and azithromycin, trend procalcitonin, follow cultures    2. COPD with acute exacerbation  - Culture sputum, continue systemic steroid and antibiotic, schedule SABA-SAMA and use additional SABA as needed    3. Hypertension  - Continue Norvasc and olmesartan    4. Bilateral LE edema  - Symmetric pretibial pitting edema noted bilaterally without discoloration or tenderness - BNP and albumin are normal, this may be d/t Norvasc    DVT prophylaxis: Lovenox  Code Status: Full  Level of Care: Level of care: Telemetry  Medical Family Communication: none present  Disposition Plan:  Patient is from: home  Anticipated d/c is to: Home  Anticipated d/c date is: 04/25/22 Patient currently: Pending improved respiratory status  Consults called: none  Admission status: Inpatient     Vianne Bulls, MD [facility]  04/23/2022, 2:05 AM

## 2022-04-23 NOTE — Plan of Care (Signed)
  Problem: Education: Goal: Knowledge of disease or condition will improve Outcome: Progressing   Problem: Activity: Goal: Ability to tolerate increased activity will improve Outcome: Progressing   Problem: Pain Managment: Goal: General experience of comfort will improve Outcome: Progressing   Problem: Safety: Goal: Ability to remain free from injury will improve Outcome: Progressing   Problem: Skin Integrity: Goal: Risk for impaired skin integrity will decrease Outcome: Progressing

## 2022-04-23 NOTE — ED Notes (Signed)
ED TO INPATIENT HANDOFF REPORT  ED Nurse Name and Phone #: [place] Coach Name/Age/Gender Kenneth Weber 71 y.o. male Room/Bed: 004C/004C  Code Status   Code Status: Full Code  Home/SNF/Other Home Patient oriented to: self, place, time, and situation Is this baseline? Yes   Triage Complete: Triage complete  Chief Complaint Pneumonia [J18.9]  Triage Note Pt to ED pov from UC. Pt was diagnosed with RSV 9 days ago, was given medication and has since had increasing shortness of breath.  Pt called PCP who instructed pt to go to UC today, was diagnosed with PNA at [facility] and sent to ED.  Pt A&Ox4, RR=26, 02 sat=84% in triage. Pt placed on 2LNC, 02 sat=90%.   Allergies Allergies  Allergen Reactions   Other    Streptogramins    Codeine Nausea And Vomiting   Lisinopril Cough   Losartan Potassium-Hctz Other (See Comments)    Erectile Dysfunction    Level of Care/Admitting Diagnosis ED Disposition     ED Disposition  Admit   Condition  --   Comment  Hospital Area: [facility] [[id number]]  Level of Care: Telemetry Medical [104]  May admit patient to [facility] or [facility] if equivalent level of care is available:: Yes  Covid Evaluation: Confirmed COVID Negative  Diagnosis: Pneumonia [227785]  Admitting Physician: Vianne Bulls [id number]  Attending Physician: Vianne Bulls [id number]  Certification:: I certify this patient will need inpatient services for at least 2 midnights  Estimated Length of Stay: 3          B Medical/Surgery History Past Medical History:  Diagnosis Date   Allergic rhinitis    Chest pain    COPD (chronic obstructive pulmonary disease) ([place])    ED (erectile dysfunction)    Hyperlipidemia    Hypertension    SOB (shortness of breath)    History reviewed. No pertinent surgical history.   A IV Location/Drains/Wounds Patient Lines/Drains/Airways Status     Active Line/Drains/Airways     Name Placement date Placement time Site  Days   Peripheral IV 04/22/22 18 G Left Antecubital 04/22/22  2345  Antecubital  1   Peripheral IV 04/22/22 20 G Anterior;Left Forearm 04/22/22  2346  Forearm  1            Intake/Output Last 24 hours  Intake/Output Summary (Last 24 hours) at 04/23/2022 1421 Last data filed at 04/23/2022 1045 Gross per 24 hour  Intake 350 ml  Output 800 ml  Net -450 ml    Labs/Imaging Results for orders placed or performed during the hospital encounter of 04/22/22 (from the past 48 hour(s))  Comprehensive metabolic panel     Status: Abnormal   Collection Time: 04/22/22  4:07 PM  Result Value Ref Range   Sodium 138 135 - 145 mmol/L   Potassium 3.9 3.5 - 5.1 mmol/L   Chloride 103 98 - 111 mmol/L   CO2 25 22 - 32 mmol/L   Glucose, Bld 117 (H) 70 - 99 mg/dL    Comment: Glucose reference range applies only to samples taken after fasting for at least 8 hours.   BUN 13 8 - 23 mg/dL   Creatinine, Ser 1.22 0.61 - 1.24 mg/dL   Calcium 8.9 8.9 - 10.3 mg/dL   Total Protein 6.9 6.5 - 8.1 g/dL   Albumin 3.7 3.5 - 5.0 g/dL   AST 19 15 - 41 U/L   ALT 17 0 - 44 U/L   Alkaline Phosphatase 76  38 - 126 U/L   Total Bilirubin 1.0 0.3 - 1.2 mg/dL   GFR, Estimated >60 >60 mL/min    Comment: (NOTE) Calculated using the CKD-EPI Creatinine Equation (2021)    Anion gap 10 5 - 15    Comment: Performed at [facility] [address], [place], [place] [address]  CBC with Differential     Status: Abnormal   Collection Time: 04/22/22  4:07 PM  Result Value Ref Range   WBC 13.9 (H) 4.0 - 10.5 K/uL   RBC 3.65 (L) 4.22 - 5.81 MIL/uL   Hemoglobin 11.6 (L) 13.0 - 17.0 g/dL   HCT 35.8 (L) 39.0 - 52.0 %   MCV 98.1 80.0 - 100.0 fL   MCH 31.8 26.0 - 34.0 pg   MCHC 32.4 30.0 - 36.0 g/dL   RDW 11.9 11.5 - 15.5 %   Platelets 265 150 - 400 K/uL   nRBC 0.0 0.0 - 0.2 %   Neutrophils Relative % 84 %   Neutro Abs 11.6 (H) 1.7 - 7.7 K/uL   Lymphocytes Relative 5 %   Lymphs Abs 0.8 0.7 - 4.0 K/uL   Monocytes  Relative 10 %   Monocytes Absolute 1.4 (H) 0.1 - 1.0 K/uL   Eosinophils Relative 0 %   Eosinophils Absolute 0.0 0.0 - 0.5 K/uL   Basophils Relative 0 %   Basophils Absolute 0.0 0.0 - 0.1 K/uL   Immature Granulocytes 1 %   Abs Immature Granulocytes 0.11 (H) 0.00 - 0.07 K/uL    Comment: Performed at [facility], [address] [place],  [address]  Resp panel by RT-PCR (RSV, Flu A&B, Covid) Anterior Nasal Swab     Status: Abnormal   Collection Time: 04/22/22  4:51 PM   Specimen: Anterior Nasal Swab  Result Value Ref Range   SARS Coronavirus 2 by RT PCR NEGATIVE NEGATIVE    Comment: (NOTE) SARS-CoV-2 target nucleic acids are NOT DETECTED.  The SARS-CoV-2 RNA is generally detectable in upper respiratory specimens during the acute phase of infection. The lowest concentration of SARS-CoV-2 viral copies this assay can detect is 138 copies/mL. A negative result does not preclude SARS-Cov-2 infection and should not be used as the sole basis for treatment or other patient management decisions. A negative result may occur with  improper specimen collection/handling, submission of specimen other than nasopharyngeal swab, presence of viral mutation(s) within the areas targeted by this assay, and inadequate number of viral copies(<138 copies/mL). A negative result must be combined with clinical observations, patient history, and epidemiological information. The expected result is Negative.  Fact Sheet for Patients:  EntrepreneurPulse.com.au  Fact Sheet for Healthcare Providers:  IncredibleEmployment.be  This test is no t yet approved or cleared by the [place] FDA and  has been authorized for detection and/or diagnosis of SARS-CoV-2 by FDA under an Emergency Use Authorization (EUA). This EUA will remain  in effect (meaning this test can be used) for the duration of the COVID-19 declaration under Section 564(b)(1) of the Act,  21 U.S.C.section 360bbb-3(b)(1), unless the authorization is terminated  or revoked sooner.       Influenza A by PCR NEGATIVE NEGATIVE   Influenza B by PCR NEGATIVE NEGATIVE    Comment: (NOTE) The Xpert Xpress SARS-CoV-2/FLU/RSV plus assay is intended as an aid in the diagnosis of influenza from Nasopharyngeal swab specimens and should not be used as a sole basis for treatment. Nasal washings and aspirates are unacceptable for Xpert Xpress SARS-CoV-2/FLU/RSV  testing.  Fact Sheet for Patients: EntrepreneurPulse.com.au  Fact Sheet for Healthcare Providers: IncredibleEmployment.be  This test is not yet approved or cleared by the [place] FDA and has been authorized for detection and/or diagnosis of SARS-CoV-2 by FDA under an Emergency Use Authorization (EUA). This EUA will remain in effect (meaning this test can be used) for the duration of the COVID-19 declaration under Section 564(b)(1) of the Act, 21 U.S.C. section 360bbb-3(b)(1), unless the authorization is terminated or revoked.     Resp Syncytial Virus by PCR POSITIVE (A) NEGATIVE    Comment: (NOTE) Fact Sheet for Patients: EntrepreneurPulse.com.au  Fact Sheet for Healthcare Providers: IncredibleEmployment.be  This test is not yet approved or cleared by the [place] FDA and has been authorized for detection and/or diagnosis of SARS-CoV-2 by FDA under an Emergency Use Authorization (EUA). This EUA will remain in effect (meaning this test can be used) for the duration of the COVID-19 declaration under Section 564(b)(1) of the Act, 21 U.S.C. section 360bbb-3(b)(1), unless the authorization is terminated or revoked.  Performed at [facility], [place] [address], [place], [place] [address]   Brain natriuretic peptide     Status: None   Collection Time: 04/22/22 11:45 PM  Result Value Ref Range   B Natriuretic Peptide 36.6 0.0 - 100.0  pg/mL    Comment: Performed at [facility] [address] [place], [place] [address]  Lactic acid, plasma     Status: None   Collection Time: 04/22/22 11:45 PM  Result Value Ref Range   Lactic Acid, Venous 0.8 0.5 - 1.9 mmol/L    Comment: Performed at [facility] [address], [place], [place] [address]  Lactic acid, plasma     Status: None   Collection Time: 04/23/22  1:06 AM  Result Value Ref Range   Lactic Acid, Venous 1.3 0.5 - 1.9 mmol/L    Comment: Performed at [facility] [address], [place], [place] [address]  Procalcitonin - Baseline     Status: None   Collection Time: 04/23/22  1:06 AM  Result Value Ref Range   Procalcitonin 0.18 ng/mL    Comment:        Interpretation: PCT (Procalcitonin) <= 0.5 ng/mL: Systemic infection (sepsis) is not likely. Local bacterial infection is possible. (NOTE)       Sepsis PCT Algorithm           Lower Respiratory Tract                                      Infection PCT Algorithm    ----------------------------     ----------------------------         PCT < 0.25 ng/mL                PCT < 0.10 ng/mL          Strongly encourage             Strongly discourage   discontinuation of antibiotics    initiation of antibiotics    ----------------------------     -----------------------------       PCT 0.25 - 0.50 ng/mL            PCT 0.10 - 0.25 ng/mL               OR       >80% decrease in PCT  Discourage initiation of                                            antibiotics      Encourage discontinuation           of antibiotics    ----------------------------     -----------------------------         PCT >= 0.50 ng/mL              PCT 0.26 - 0.50 ng/mL               AND        <80% decrease in PCT             Encourage initiation of                                             antibiotics       Encourage continuation           of antibiotics    ----------------------------      -----------------------------        PCT >= 0.50 ng/mL                  PCT > 0.50 ng/mL               AND         increase in PCT                  Strongly encourage                                      initiation of antibiotics    Strongly encourage escalation           of antibiotics                                     -----------------------------                                           PCT <= 0.25 ng/mL                                                 OR                                        > 80% decrease in PCT                                      Discontinue / Do not initiate  antibiotics  Performed at [facility], [address] [place], [place] [address]   Expectorated Sputum Assessment w Gram Stain, Rflx to Resp Cult     Status: None   Collection Time: 04/23/22  2:04 AM   Specimen: Expectorated Sputum  Result Value Ref Range   Specimen Description EXPECTORATED SPUTUM    Special Requests NONE    Sputum evaluation      THIS SPECIMEN IS ACCEPTABLE FOR SPUTUM CULTURE Performed at [facility], [address] [place], [place] [address]    Report Status 04/23/2022 FINAL   Strep pneumoniae urinary antigen     Status: None   Collection Time: 04/23/22  2:04 AM  Result Value Ref Range   Strep Pneumo Urinary Antigen NEGATIVE NEGATIVE    Comment:        Infection due to S. pneumoniae cannot be absolutely ruled out since the antigen present may be below the detection limit of the test. Performed at [facility], [address] [place], [place] [address]   Culture, Respiratory w Gram Stain     Status: None (Preliminary result)   Collection Time: 04/23/22  2:04 AM  Result Value Ref Range   Specimen Description EXPECTORATED SPUTUM    Special Requests NONE Reflexed from [id number]    Gram Stain      ABUNDANT WBC PRESENT,BOTH PMN AND MONONUCLEAR MODERATE GRAM POSITIVE COCCI IN PAIRS FEW GRAM NEGATIVE  RODS Performed at [facility], [address] [place], [place] [address]    Culture PENDING    Report Status PENDING   Procalcitonin     Status: None   Collection Time: 04/23/22  5:05 AM  Result Value Ref Range   Procalcitonin 0.24 ng/mL    Comment:        Interpretation: PCT (Procalcitonin) <= 0.5 ng/mL: Systemic infection (sepsis) is not likely. Local bacterial infection is possible. (NOTE)       Sepsis PCT Algorithm           Lower Respiratory Tract                                      Infection PCT Algorithm    ----------------------------     ----------------------------         PCT < 0.25 ng/mL                PCT < 0.10 ng/mL          Strongly encourage             Strongly discourage   discontinuation of antibiotics    initiation of antibiotics    ----------------------------     -----------------------------       PCT 0.25 - 0.50 ng/mL            PCT 0.10 - 0.25 ng/mL               OR       >80% decrease in PCT            Discourage initiation of                                            antibiotics      Encourage discontinuation  of antibiotics    ----------------------------     -----------------------------         PCT >= 0.50 ng/mL              PCT 0.26 - 0.50 ng/mL               AND        <80% decrease in PCT             Encourage initiation of                                             antibiotics       Encourage continuation           of antibiotics    ----------------------------     -----------------------------        PCT >= 0.50 ng/mL                  PCT > 0.50 ng/mL               AND         increase in PCT                  Strongly encourage                                      initiation of antibiotics    Strongly encourage escalation           of antibiotics                                     -----------------------------                                           PCT <= 0.25 ng/mL                                                  OR                                        > 80% decrease in PCT                                      Discontinue / Do not initiate                                             antibiotics  Performed at [facility], [address], [place], [place] [address]   HIV Antibody (routine testing w rflx)     Status: None   Collection Time: 04/23/22  5:05 AM  Result Value Ref Range   HIV Screen  4th Generation wRfx Non Reactive Non Reactive    Comment: Performed at [facility], [place] [address], [place], [place] [id number]  Basic metabolic panel     Status: Abnormal   Collection Time: 04/23/22  5:05 AM  Result Value Ref Range   Sodium 136 135 - 145 mmol/L   Potassium 3.6 3.5 - 5.1 mmol/L   Chloride 102 98 - 111 mmol/L   CO2 22 22 - 32 mmol/L   Glucose, Bld 154 (H) 70 - 99 mg/dL    Comment: Glucose reference range applies only to samples taken after fasting for at least 8 hours.   BUN 15 8 - 23 mg/dL   Creatinine, Ser 1.31 (H) 0.61 - 1.24 mg/dL   Calcium 8.6 (L) 8.9 - 10.3 mg/dL   GFR, Estimated 59 (L) >60 mL/min    Comment: (NOTE) Calculated using the CKD-EPI Creatinine Equation (2021)    Anion gap 12 5 - 15    Comment: Performed at [facility] [address] [place], [place] [address]  Magnesium     Status: None   Collection Time: 04/23/22  5:05 AM  Result Value Ref Range   Magnesium 1.9 1.7 - 2.4 mg/dL    Comment: Performed at [facility] [address], [place], [place] [address]  CBC     Status: Abnormal   Collection Time: 04/23/22  5:05 AM  Result Value Ref Range   WBC 14.0 (H) 4.0 - 10.5 K/uL   RBC 3.41 (L) 4.22 - 5.81 MIL/uL   Hemoglobin 11.1 (L) 13.0 - 17.0 g/dL   HCT 33.0 (L) 39.0 - 52.0 %   MCV 96.8 80.0 - 100.0 fL   MCH 32.6 26.0 - 34.0 pg   MCHC 33.6 30.0 - 36.0 g/dL   RDW 12.0 11.5 - 15.5 %   Platelets 256 150 - 400 K/uL   nRBC 0.0 0.0 - 0.2 %    Comment: Performed at [facility], [place] [address], [place], [place] [address]   DG  Chest 2 View  Result Date: 04/22/2022 CLINICAL DATA:  Shortness of breath, cough EXAM: CHEST - 2 VIEW COMPARISON:  Previous examinations including the study done earlier today FINDINGS: Cardiac size is within normal limits. Increase in AP diameter of chest suggests COPD. Small patchy infiltrate is seen in left lower lung field. This finding has not changed significantly in comparison with the immediate previous study done earlier today. This finding was not distinctly seen in an earlier examination done on 05/29/2021. In the lateral view, the infiltrate appears to be anterior, possibly within lingula. There is peribronchial thickening. There is no pleural effusion or pneumothorax. IMPRESSION: There is small patchy infiltrate in left lower lung field suggesting interstitial pneumonia. There are no new infiltrates or signs of pulmonary edema in comparison with the study done earlier today. There is no pleural effusion. Electronically Signed   By: Elmer Picker M.D.   On: 04/22/2022 17:30    Pending Labs Unresulted Labs (From admission, onward)     Start     Ordered   04/30/22 0500  Creatinine, serum  (enoxaparin (LOVENOX)    CrCl >/= 30 ml/min)  Weekly,   R     Comments: while on enoxaparin therapy    04/23/22 0205   04/23/22 0500  Procalcitonin  Daily,   R      04/23/22 0027   04/23/22 [id number]  Basic metabolic panel  Daily,   R  04/23/22 0205   04/23/22 0500  CBC  Daily,   R      04/23/22 0205   04/23/22 0204  Legionella Pneumophila Serogp 1 Ur Ag  (COPD / Pneumonia / Cellulitis / Lower Extremity Wound)  Once,   R        04/23/22 0205   04/22/22 2325  Blood culture (routine x 2)  BLOOD CULTURE X 2,   R (with STAT occurrences)      04/22/22 2325            Vitals/Pain Today's Vitals   04/23/22 0845 04/23/22 0930 04/23/22 1015 04/23/22 1040  BP: 114/83 108/77 118/63   Pulse: 86 73 76   Resp: (!) 32 (!) 24 (!) 22   Temp:    97.9 F (36.6 C)  TempSrc:    Oral  SpO2: 91% 93%  94%   Weight:      Height:      PainSc:        Isolation Precautions Droplet precaution  Medications Medications  acetaminophen (TYLENOL) tablet 1,000 mg (1,000 mg Oral Not Given 04/22/22 2317)  enoxaparin (LOVENOX) injection 40 mg (40 mg Subcutaneous Given 04/23/22 0904)  sodium chloride flush (NS) 0.9 % injection 3 mL (3 mLs Intravenous Given 04/23/22 0905)  acetaminophen (TYLENOL) tablet 650 mg (has no administration in time range)    Or  acetaminophen (TYLENOL) suppository 650 mg (has no administration in time range)  oxyCODONE (Oxy IR/ROXICODONE) immediate release tablet 5 mg (has no administration in time range)  polyethylene glycol (MIRALAX / GLYCOLAX) packet 17 g (has no administration in time range)  ondansetron (ZOFRAN) tablet 4 mg (has no administration in time range)    Or  ondansetron (ZOFRAN) injection 4 mg (has no administration in time range)  cefTRIAXone (ROCEPHIN) 2 g in sodium chloride 0.9 % 100 mL IVPB (has no administration in time range)  azithromycin (ZITHROMAX) tablet 500 mg (has no administration in time range)  methylPREDNISolone sodium succinate (SOLU-MEDROL) 125 mg/2 mL injection 125 mg (125 mg Intravenous Given 04/23/22 0903)    Followed by  predniSONE (DELTASONE) tablet 40 mg (has no administration in time range)  ipratropium-albuterol (DUONEB) 0.5-2.5 (3) MG/3ML nebulizer solution 3 mL (3 mLs Inhalation Given 04/23/22 0904)  albuterol (PROVENTIL) (2.5 MG/3ML) 0.083% nebulizer solution 2.5 mg (has no administration in time range)  budesonide (PULMICORT) nebulizer solution 0.5 mg (0.5 mg Nebulization Given 04/23/22 0904)  arformoterol (BROVANA) nebulizer solution 15 mcg (15 mcg Nebulization Given 04/23/22 0904)  methylPREDNISolone sodium succinate (SOLU-MEDROL) 125 mg/2 mL injection 125 mg (125 mg Intravenous Given 04/22/22 2346)  ipratropium (ATROVENT) nebulizer solution 0.5 mg (0.5 mg Nebulization Given 04/22/22 2351)    Mobility walks Low fall risk    Focused Assessments Pulmonary Assessment Handoff:  Lung sounds:O2 Device: Nasal Cannula O2 Flow Rate (L/min): 4 L/min    R Recommendations: See Admitting Provider Note  Report given to:   Additional Notes:

## 2022-04-23 NOTE — Progress Notes (Signed)
PROGRESS NOTE    Kaven Cumbie  TDD:[id number] DOB: [birth year] DOA: 04/22/2022 PCP: [name], MD    Brief Narrative:   Kenneth Weber is a 71 y.o. male with past medical history significant for HTN, COPD who presented to [facility] ED on 12/18 from urgent care with progressive shortness of breath.  Recently diagnosed with RSV on 12/8.  Was prescribed a cough medicine and nasal spray without much relief.  Now developing a productive cough and return to urgent care on 12/18 was found to be hypoxic and directed to the ED for further evaluation.  In the ED, temperature 99.4 F, HR 99, RR 26, BP 153/74, SpO2 84% on room air.  WBC 13.9, hemoglobin 11.6, platelets 265.  Sodium 138, potassium 3.9, chloride 103, CO2 25, glucose 117, BUN 13, Cram 1.22.  AST 19, ALT 17, total bilirubin 1.0.  BNP 36.6.  Lactic acid 0.8.  Procalcitonin 0.24.  Cova-19 PCR negative.  Influenza A/B PCR negative.  RSV positive.  Chest x-ray with small patchy infiltrate left lower lung suggestive of interstitial pneumonia.  Patient was given acetaminophen, albuterol, Atrovent, IV Solu-Medrol, ceftriaxone, azithromycin.  Patient was started on supplemental oxygen.  TRH consulted for further evaluation and management of commune acquired pneumonia, complicated by COPD exacerbation and respiratory syncytial virus infection.  Assessment & Plan:   Acute hypoxic respiratory failure, POA Patient presenting to ED with progressive shortness of breath Notably hypoxic on presentation with SpO2 84%; not oxygen dependent. Etiology likely multifactorial in the setting of RSV infection, community-acquired pneumonia and COPD exacerbation. --Continue supplemental oxygen, maintain SpO2 greater than 88% --Continue treatment as below  Community acquired pneumonia On presentation in the ED, temperature 99.4, WBC count elevated 13.9 with chest x-ray findings of infiltrates left lower lung suggestive of pneumonia. -- Azithromycin 500 mg p.o. daily --  Ceftriaxone 2 g IV every 24 hours -- Mucinex 600 mg p.o. twice daily -- Continue supplemental oxygen, maintain SpO2 greater than 88%, currently on 3 L nasal cannula -- Incentive spirometer/flutter valve -- CBC daily  COPD exacerbation Not oxygen dependent at baseline.  Currently managed with [facility] inhaler outpatient. -- Pulmicort neb twice daily -- Brovana neb twice daily -- DuoNeb every 6 hours --Solu-Medrol 125 mg IV every 12 hours x 24 hours followed by prednisone 40 mg p.o. daily -- Albuterol every 4 hours as needed shortness of breath/wheezing  Essential hypertension Patient with borderline hypotension on admission.  Home medication regimen includes amlodipine 10 mg p.o. daily, doxazosin 4 mg p.o. nightly. -- Continue to hold home antihypertensives for now --Hydralazine 25mg  PO q6h PRN SBP >165 or DBP >110 --Monitor BP closely  Bilateral lower extremity edema Unclear if side effect from amlodipine use. -- Check D-dimer, if positive will obtain vascular duplex ultrasound bilateral lower extremities  Hx tobacco use disorder Patient reports continues with tobacco cessation over the past 8 years.  Continue encourage full cessation.   DVT prophylaxis: enoxaparin (LOVENOX) injection 40 mg Start: 04/23/22 1000    Code Status: Full Code Family Communication: Updated spouse present at bedside this morning  Disposition Plan:  Level of care: Telemetry Medical Status is: Inpatient Remains inpatient appropriate because: IV antibiotics/steroids, needs weaning from supplemental oxygen    Consultants:  None  Procedures:  None  Antimicrobials:  Azithromycin 12/18>> Ceftriaxone 12/18>>   Subjective: Patient seen examined bedside, resting comfortably.  Lying in bed remains in ED holding area.  Spouse present.  Reports shortness of breath slightly improved but continues to require supplemental oxygen with  significant wheezing.  Discussed with patient and spouse will hold blood  pressure medicines given borderline hypotension.  No other specific complaints or concerns at this time other than continued productive cough.  Patient denies headache, no dizziness, no visual changes, no current fever/chills/night sweats, no nausea cefonicid diarrhea, no chest pain, no palpitations, no abdominal pain, no focal weakness, no fatigue, no paresthesias.  No acute events overnight per nursing staff.  Objective: Vitals:   04/23/22 1145 04/23/22 1230 04/23/22 1315 04/23/22 1400  BP: 116/72 121/68 122/64 111/60  Pulse: 71 77 73 75  Resp: (!) 30 (!) 25 (!) 24 (!) 26  Temp:    98.8 F (37.1 C)  TempSrc:    Oral  SpO2: 93% 90% 94% 93%  Weight:      Height:        Intake/Output Summary (Last 24 hours) at 04/23/2022 1426 Last data filed at 04/23/2022 1045 Gross per 24 hour  Intake 350 ml  Output 800 ml  Net -450 ml   Filed Weights   04/22/22 1616  Weight: 84.8 kg    Examination:  Physical Exam: GEN: NAD, alert and oriented x 3, ill in appearance HEENT: NCAT, PERRL, EOMI, sclera clear, MMM PULM: Fuhs wheezing throughout all lung fields, decreased lung sounds bilateral bases, no crackles, normal respiratory effort without accessory muscle use, on 3 L nasal cannula with SpO2 92% at rest CV: RRR w/o M/G/R GI: abd soft, NTND, NABS, no R/G/M MSK: no peripheral edema, muscle strength globally intact 5/5 bilateral upper/lower extremities NEURO: CN II-XII intact, no focal deficits, sensation to light touch intact PSYCH: normal mood/affect Integumentary: dry/intact, no rashes or wounds    Data Reviewed: I have personally reviewed following labs and imaging studies  CBC: Recent Labs  Lab 04/22/22 1607 04/23/22 0505  WBC 13.9* 14.0*  NEUTROABS 11.6*  --   HGB 11.6* 11.1*  HCT 35.8* 33.0*  MCV 98.1 96.8  PLT 265 [id number]   Basic Metabolic Panel: Recent Labs  Lab 04/22/22 1607 04/23/22 0505  NA 138 136  K 3.9 3.6  CL 103 102  CO2 25 22  GLUCOSE 117* 154*  BUN 13 15   CREATININE 1.22 1.31*  CALCIUM 8.9 8.6*  MG  --  1.9   GFR: Estimated Creatinine Clearance: 59.3 mL/min (A) (by C-G formula based on SCr of 1.31 mg/dL (H)). Liver Function Tests: Recent Labs  Lab 04/22/22 1607  AST 19  ALT 17  ALKPHOS 76  BILITOT 1.0  PROT 6.9  ALBUMIN 3.7   No results for input(s): "LIPASE", "AMYLASE" in the last 168 hours. No results for input(s): "AMMONIA" in the last 168 hours. Coagulation Profile: No results for input(s): "INR", "PROTIME" in the last 168 hours. Cardiac Enzymes: No results for input(s): "CKTOTAL", "CKMB", "CKMBINDEX", "TROPONINI" in the last 168 hours. BNP (last 3 results) No results for input(s): "PROBNP" in the last 8760 hours. HbA1C: No results for input(s): "HGBA1C" in the last 72 hours. CBG: No results for input(s): "GLUCAP" in the last 168 hours. Lipid Profile: No results for input(s): "CHOL", "HDL", "LDLCALC", "TRIG", "CHOLHDL", "LDLDIRECT" in the last 72 hours. Thyroid Function Tests: No results for input(s): "TSH", "T4TOTAL", "FREET4", "T3FREE", "THYROIDAB" in the last 72 hours. Anemia Panel: No results for input(s): "VITAMINB12", "FOLATE", "FERRITIN", "TIBC", "IRON", "RETICCTPCT" in the last 72 hours. Sepsis Labs: Recent Labs  Lab 04/22/22 2345 04/23/22 0106 04/23/22 0505  PROCALCITON  --  0.18 0.24  LATICACIDVEN 0.8 1.3  --     Recent  Results (from the past 240 hour(s))  Resp panel by RT-PCR (RSV, Flu A&B, Covid) Anterior Nasal Swab     Status: Abnormal   Collection Time: 04/22/22  4:51 PM   Specimen: Anterior Nasal Swab  Result Value Ref Range Status   SARS Coronavirus 2 by RT PCR NEGATIVE NEGATIVE Final    Comment: (NOTE) SARS-CoV-2 target nucleic acids are NOT DETECTED.  The SARS-CoV-2 RNA is generally detectable in upper respiratory specimens during the acute phase of infection. The lowest concentration of SARS-CoV-2 viral copies this assay can detect is 138 copies/mL. A negative result does not preclude  SARS-Cov-2 infection and should not be used as the sole basis for treatment or other patient management decisions. A negative result may occur with  improper specimen collection/handling, submission of specimen other than nasopharyngeal swab, presence of viral mutation(s) within the areas targeted by this assay, and inadequate number of viral copies(<138 copies/mL). A negative result must be combined with clinical observations, patient history, and epidemiological information. The expected result is Negative.  Fact Sheet for Patients:  EntrepreneurPulse.com.au  Fact Sheet for Healthcare Providers:  IncredibleEmployment.be  This test is no t yet approved or cleared by the [place] FDA and  has been authorized for detection and/or diagnosis of SARS-CoV-2 by FDA under an Emergency Use Authorization (EUA). This EUA will remain  in effect (meaning this test can be used) for the duration of the COVID-19 declaration under Section 564(b)(1) of the Act, 21 U.S.C.section 360bbb-3(b)(1), unless the authorization is terminated  or revoked sooner.       Influenza A by PCR NEGATIVE NEGATIVE Final   Influenza B by PCR NEGATIVE NEGATIVE Final    Comment: (NOTE) The Xpert Xpress SARS-CoV-2/FLU/RSV plus assay is intended as an aid in the diagnosis of influenza from Nasopharyngeal swab specimens and should not be used as a sole basis for treatment. Nasal washings and aspirates are unacceptable for Xpert Xpress SARS-CoV-2/FLU/RSV testing.  Fact Sheet for Patients: EntrepreneurPulse.com.au  Fact Sheet for Healthcare Providers: IncredibleEmployment.be  This test is not yet approved or cleared by the [place] FDA and has been authorized for detection and/or diagnosis of SARS-CoV-2 by FDA under an Emergency Use Authorization (EUA). This EUA will remain in effect (meaning this test can be used) for the duration of  the COVID-19 declaration under Section 564(b)(1) of the Act, 21 U.S.C. section 360bbb-3(b)(1), unless the authorization is terminated or revoked.     Resp Syncytial Virus by PCR POSITIVE (A) NEGATIVE Final    Comment: (NOTE) Fact Sheet for Patients: EntrepreneurPulse.com.au  Fact Sheet for Healthcare Providers: IncredibleEmployment.be  This test is not yet approved or cleared by the [place] FDA and has been authorized for detection and/or diagnosis of SARS-CoV-2 by FDA under an Emergency Use Authorization (EUA). This EUA will remain in effect (meaning this test can be used) for the duration of the COVID-19 declaration under Section 564(b)(1) of the Act, 21 U.S.C. section 360bbb-3(b)(1), unless the authorization is terminated or revoked.  Performed at [facility], [place] [address], [place], [place] [address]   Expectorated Sputum Assessment w Gram Stain, Rflx to Resp Cult     Status: None   Collection Time: 04/23/22  2:04 AM   Specimen: Expectorated Sputum  Result Value Ref Range Status   Specimen Description EXPECTORATED SPUTUM  Final   Special Requests NONE  Final   Sputum evaluation   Final    THIS SPECIMEN IS ACCEPTABLE FOR SPUTUM CULTURE Performed at [facility], [place] [address] [place], [place] [address]    Report Status 04/23/2022 FINAL  Final  Culture, Respiratory w Gram Stain     Status: None (Preliminary result)   Collection Time: 04/23/22  2:04 AM  Result Value Ref Range Status   Specimen Description EXPECTORATED SPUTUM  Final   Special Requests NONE Reflexed from [id number]  Final   Gram Stain   Final    ABUNDANT WBC PRESENT,BOTH PMN AND MONONUCLEAR MODERATE GRAM POSITIVE COCCI IN PAIRS FEW GRAM NEGATIVE RODS Performed at [facility], [place] [address], [place], [place] [address]    Culture PENDING  Incomplete   Report Status PENDING  Incomplete         Radiology Studies: DG Chest 2 View  Result  Date: 04/22/2022 CLINICAL DATA:  Shortness of breath, cough EXAM: CHEST - 2 VIEW COMPARISON:  Previous examinations including the study done earlier today FINDINGS: Cardiac size is within normal limits. Increase in AP diameter of chest suggests COPD. Small patchy infiltrate is seen in left lower lung field. This finding has not changed significantly in comparison with the immediate previous study done earlier today. This finding was not distinctly seen in an earlier examination done on 05/29/2021. In the lateral view, the infiltrate appears to be anterior, possibly within lingula. There is peribronchial thickening. There is no pleural effusion or pneumothorax. IMPRESSION: There is small patchy infiltrate in left lower lung field suggesting interstitial pneumonia. There are no new infiltrates or signs of pulmonary edema in comparison with the study done earlier today. There is no pleural effusion. Electronically Signed   By: Elmer Picker M.D.   On: 04/22/2022 17:30        Scheduled Meds:  acetaminophen  1,000 mg Oral Once   arformoterol  15 mcg Nebulization BID   azithromycin  500 mg Oral Daily   budesonide (PULMICORT) nebulizer solution  0.5 mg Nebulization BID   enoxaparin (LOVENOX) injection  40 mg Subcutaneous Daily   ipratropium-albuterol  3 mL Inhalation Q6H   methylPREDNISolone (SOLU-MEDROL) injection  125 mg Intravenous Q12H   Followed by   Derrill Memo ON 04/24/2022] predniSONE  40 mg Oral Q breakfast   sodium chloride flush  3 mL Intravenous Q12H   Continuous Infusions:  cefTRIAXone (ROCEPHIN)  IV       LOS: 0 days    Time spent: 53 minutes spent on chart review, discussion with nursing staff, consultants, updating family and interview/physical exam; more than 50% of that time was spent in counseling and/or coordination of care.    Chaniya Genter J [place] DO [facility] Available via Epic secure chat 7am-7pm After these hours, please refer to coverage provider listed on  [email or web address] 04/23/2022, 2:26 PM

## 2022-04-24 ENCOUNTER — Inpatient Hospital Stay: Admission: RE | Admit: 2022-04-24 | Payer: Medicare Other | Source: Ambulatory Visit

## 2022-04-24 ENCOUNTER — Inpatient Hospital Stay (HOSPITAL_COMMUNITY): Payer: Medicare Other

## 2022-04-24 DIAGNOSIS — J189 Pneumonia, unspecified organism: Secondary | ICD-10-CM | POA: Diagnosis not present

## 2022-04-24 DIAGNOSIS — R7989 Other specified abnormal findings of blood chemistry: Secondary | ICD-10-CM | POA: Diagnosis not present

## 2022-04-24 LAB — CBC
HCT: 30.7 % — ABNORMAL LOW (ref 39.0–52.0)
Hemoglobin: 10.6 g/dL — ABNORMAL LOW (ref 13.0–17.0)
MCH: 33 pg (ref 26.0–34.0)
MCHC: 34.5 g/dL (ref 30.0–36.0)
MCV: 95.6 fL (ref 80.0–100.0)
Platelets: 269 10*3/uL (ref 150–400)
RBC: 3.21 MIL/uL — ABNORMAL LOW (ref 4.22–5.81)
RDW: 12 % (ref 11.5–15.5)
WBC: 15.8 10*3/uL — ABNORMAL HIGH (ref 4.0–10.5)
nRBC: 0 % (ref 0.0–0.2)

## 2022-04-24 LAB — BASIC METABOLIC PANEL
Anion gap: 11 (ref 5–15)
BUN: 25 mg/dL — ABNORMAL HIGH (ref 8–23)
CO2: 24 mmol/L (ref 22–32)
Calcium: 8.8 mg/dL — ABNORMAL LOW (ref 8.9–10.3)
Chloride: 104 mmol/L (ref 98–111)
Creatinine, Ser: 1.28 mg/dL — ABNORMAL HIGH (ref 0.61–1.24)
GFR, Estimated: >60 mL/min (ref >60)
Glucose, Bld: 160 mg/dL — ABNORMAL HIGH (ref 70–99)
Potassium: 3.7 mmol/L (ref 3.5–5.1)
Sodium: 139 mmol/L (ref 135–145)

## 2022-04-24 LAB — LEGIONELLA PNEUMOPHILA SEROGP 1 UR AG: L. pneumophila Serogp 1 Ur Ag: NEGATIVE

## 2022-04-24 LAB — PROCALCITONIN: Procalcitonin: 0.28 ng/mL

## 2022-04-24 MED ORDER — IPRATROPIUM-ALBUTEROL 0.5-2.5 (3) MG/3ML IN SOLN
3.0000 mL | Freq: Three times a day (TID) | RESPIRATORY_TRACT | Status: DC
  Administered 2022-04-24 (×3): 3 mL via RESPIRATORY_TRACT
  Filled 2022-04-24 (×3): qty 3

## 2022-04-24 MED ORDER — DOXYLAMINE SUCCINATE (SLEEP) 25 MG PO TABS
25.0000 mg | ORAL_TABLET | Freq: Every evening | ORAL | Status: DC | PRN
  Administered 2022-04-24: 25 mg via ORAL
  Filled 2022-04-24 (×2): qty 1

## 2022-04-24 NOTE — Progress Notes (Signed)
BLE venous duplex has been completed.    Results can be found under chart review under CV PROC. 04/24/2022 11:47 AM Brier Reid RVT, RDMS

## 2022-04-24 NOTE — Progress Notes (Signed)
PROGRESS NOTE    Kenneth Weber  QIW:[id number] DOB: [birth year] DOA: 04/22/2022 PCP: [name], MD    Brief Narrative:   Kenneth Weber is a 71 y.o. male with past medical history significant for HTN, COPD who presented to [facility] ED on 12/18 from urgent care with progressive shortness of breath.  Recently diagnosed with RSV on 12/8.  Was prescribed a cough medicine and nasal spray without much relief.  Now developing a productive cough and return to urgent care on 12/18 was found to be hypoxic and directed to the ED for further evaluation.  In the ED, temperature 99.4 F, HR 99, RR 26, BP 153/74, SpO2 84% on room air.  WBC 13.9, hemoglobin 11.6, platelets 265.  Sodium 138, potassium 3.9, chloride 103, CO2 25, glucose 117, BUN 13, Cram 1.22.  AST 19, ALT 17, total bilirubin 1.0.  BNP 36.6.  Lactic acid 0.8.  Procalcitonin 0.24.  Cova-19 PCR negative.  Influenza A/B PCR negative.  RSV positive.  Chest x-ray with small patchy infiltrate left lower lung suggestive of interstitial pneumonia.  Patient was given acetaminophen, albuterol, Atrovent, IV Solu-Medrol, ceftriaxone, azithromycin.  Patient was started on supplemental oxygen.  TRH consulted for further evaluation and management of commune acquired pneumonia, complicated by COPD exacerbation and respiratory syncytial virus infection.  Assessment & Plan:   Acute hypoxic respiratory failure, POA Patient presenting to ED with progressive shortness of breath Notably hypoxic on presentation with SpO2 84%; not oxygen dependent. Etiology likely multifactorial in the setting of RSV infection, community-acquired pneumonia and COPD exacerbation. --Continue supplemental oxygen, maintain SpO2 greater than 88%; now titrated down to 2.5 L nasal cannula --Continue treatment as below  Community acquired pneumonia On presentation in the ED, temperature 99.4, WBC count elevated 13.9 with chest x-ray findings of infiltrates left lower lung suggestive of  pneumonia. -- Azithromycin 500 mg p.o. daily -- Ceftriaxone 2 g IV every 24 hours -- Mucinex 600 mg p.o. twice daily -- Continue supplemental oxygen, maintain SpO2 greater than 88%, currently on 2.5 L nasal cannula -- Incentive spirometer/flutter valve -- CBC daily  COPD exacerbation Not oxygen dependent at baseline.  Currently managed with [facility] inhaler outpatient. -- Pulmicort neb twice daily -- Brovana neb twice daily -- DuoNeb every 6 hours -- Prednisone 40 mg p.o. daily -- Albuterol every 4 hours as needed shortness of breath/wheezing  Essential hypertension Patient with borderline hypotension on admission.  Home medication regimen includes amlodipine 10 mg p.o. daily, doxazosin 4 mg p.o. nightly. -- Continue to hold home antihypertensives for now -- Hydralazine 25mg  PO q6h PRN SBP >165 or DBP >110 -- Monitor BP closely  Bilateral lower extremity edema Unclear if side effect from amlodipine use.  D-dimer elevated 0.97, bilateral duplex vascular ultrasound negative for DVT.  Hx tobacco use disorder Patient reports continues with tobacco cessation over the past 8 years.  Continue encourage full cessation.   DVT prophylaxis: enoxaparin (LOVENOX) injection 40 mg Start: 04/23/22 1000    Code Status: Full Code Family Communication: Updated spouse present at bedside this morning  Disposition Plan:  Level of care: Telemetry Medical Status is: Inpatient Remains inpatient appropriate because: IV antibiotics/steroids, needs weaning from supplemental oxygen    Consultants:  None  Procedures:  None  Antimicrobials:  Azithromycin 12/18>> Ceftriaxone 12/18>>   Subjective: Patient seen examined bedside, resting comfortably.  Lying in bed.  Reports shortness of breath much improved with decreased productive cough.  Remains on supplemental oxygen, now titrated down to 2.5 L nasal cannula.  No other specific complaints or concerns at this time other than continued productive  cough.  Patient denies headache, no dizziness, no visual changes, no current fever/chills/night sweats, no nausea cefonicid diarrhea, no chest pain, no palpitations, no abdominal pain, no focal weakness, no fatigue, no paresthesias.  No acute events overnight per nursing staff.  Objective: Vitals:   04/24/22 0408 04/24/22 0737 04/24/22 0812 04/24/22 1416  BP: 101/62 127/74    Pulse:  88    Resp: 20     Temp: 97.9 F (36.6 C) 97.9 F (36.6 C)    TempSrc: Oral Oral    SpO2: 97% 96% 97% 96%  Weight:      Height:        Intake/Output Summary (Last 24 hours) at 04/24/2022 1444 Last data filed at 04/24/2022 0429 Gross per 24 hour  Intake --  Output 350 ml  Net -350 ml   Filed Weights   04/22/22 1616  Weight: 84.8 kg    Examination:  Physical Exam: GEN: NAD, alert and oriented x 3, ill in appearance HEENT: NCAT, PERRL, EOMI, sclera clear, MMM PULM: Fuhs wheezing throughout all lung fields, decreased lung sounds bilateral bases, no crackles, normal respiratory effort without accessory muscle use, on 2.5 L nasal cannula with SpO2 96% at rest CV: RRR w/o M/G/R GI: abd soft, NTND, NABS, no R/G/M MSK: no peripheral edema, muscle strength globally intact 5/5 bilateral upper/lower extremities NEURO: CN II-XII intact, no focal deficits, sensation to light touch intact PSYCH: normal mood/affect Integumentary: dry/intact, no rashes or wounds    Data Reviewed: I have personally reviewed following labs and imaging studies  CBC: Recent Labs  Lab 04/22/22 1607 04/23/22 0505 04/24/22 0310  WBC 13.9* 14.0* 15.8*  NEUTROABS 11.6*  --   --   HGB 11.6* 11.1* 10.6*  HCT 35.8* 33.0* 30.7*  MCV 98.1 96.8 95.6  PLT 265 256 [id number]   Basic Metabolic Panel: Recent Labs  Lab 04/22/22 1607 04/23/22 0505 04/24/22 0310  NA 138 136 139  K 3.9 3.6 3.7  CL 103 102 104  CO2 25 22 24   GLUCOSE 117* 154* 160*  BUN 13 15 25*  CREATININE 1.22 1.31* 1.28*  CALCIUM 8.9 8.6* 8.8*  MG  --  1.9   --    GFR: Estimated Creatinine Clearance: 60.7 mL/min (A) (by C-G formula based on SCr of 1.28 mg/dL (H)). Liver Function Tests: Recent Labs  Lab 04/22/22 1607  AST 19  ALT 17  ALKPHOS 76  BILITOT 1.0  PROT 6.9  ALBUMIN 3.7   No results for input(s): "LIPASE", "AMYLASE" in the last 168 hours. No results for input(s): "AMMONIA" in the last 168 hours. Coagulation Profile: No results for input(s): "INR", "PROTIME" in the last 168 hours. Cardiac Enzymes: No results for input(s): "CKTOTAL", "CKMB", "CKMBINDEX", "TROPONINI" in the last 168 hours. BNP (last 3 results) No results for input(s): "PROBNP" in the last 8760 hours. HbA1C: No results for input(s): "HGBA1C" in the last 72 hours. CBG: No results for input(s): "GLUCAP" in the last 168 hours. Lipid Profile: No results for input(s): "CHOL", "HDL", "LDLCALC", "TRIG", "CHOLHDL", "LDLDIRECT" in the last 72 hours. Thyroid Function Tests: No results for input(s): "TSH", "T4TOTAL", "FREET4", "T3FREE", "THYROIDAB" in the last 72 hours. Anemia Panel: No results for input(s): "VITAMINB12", "FOLATE", "FERRITIN", "TIBC", "IRON", "RETICCTPCT" in the last 72 hours. Sepsis Labs: Recent Labs  Lab 04/22/22 2345 04/23/22 0106 04/23/22 0505 04/24/22 0310  PROCALCITON  --  0.18 0.24 0.28  LATICACIDVEN 0.8 1.3  --   --  Recent Results (from the past 240 hour(s))  Resp panel by RT-PCR (RSV, Flu A&B, Covid) Anterior Nasal Swab     Status: Abnormal   Collection Time: 04/22/22  4:51 PM   Specimen: Anterior Nasal Swab  Result Value Ref Range Status   SARS Coronavirus 2 by RT PCR NEGATIVE NEGATIVE Final    Comment: (NOTE) SARS-CoV-2 target nucleic acids are NOT DETECTED.  The SARS-CoV-2 RNA is generally detectable in upper respiratory specimens during the acute phase of infection. The lowest concentration of SARS-CoV-2 viral copies this assay can detect is 138 copies/mL. A negative result does not preclude SARS-Cov-2 infection and  should not be used as the sole basis for treatment or other patient management decisions. A negative result may occur with  improper specimen collection/handling, submission of specimen other than nasopharyngeal swab, presence of viral mutation(s) within the areas targeted by this assay, and inadequate number of viral copies(<138 copies/mL). A negative result must be combined with clinical observations, patient history, and epidemiological information. The expected result is Negative.  Fact Sheet for Patients:  EntrepreneurPulse.com.au  Fact Sheet for Healthcare Providers:  IncredibleEmployment.be  This test is no t yet approved or cleared by the [place] FDA and  has been authorized for detection and/or diagnosis of SARS-CoV-2 by FDA under an Emergency Use Authorization (EUA). This EUA will remain  in effect (meaning this test can be used) for the duration of the COVID-19 declaration under Section 564(b)(1) of the Act, 21 U.S.C.section 360bbb-3(b)(1), unless the authorization is terminated  or revoked sooner.       Influenza A by PCR NEGATIVE NEGATIVE Final   Influenza B by PCR NEGATIVE NEGATIVE Final    Comment: (NOTE) The Xpert Xpress SARS-CoV-2/FLU/RSV plus assay is intended as an aid in the diagnosis of influenza from Nasopharyngeal swab specimens and should not be used as a sole basis for treatment. Nasal washings and aspirates are unacceptable for Xpert Xpress SARS-CoV-2/FLU/RSV testing.  Fact Sheet for Patients: EntrepreneurPulse.com.au  Fact Sheet for Healthcare Providers: IncredibleEmployment.be  This test is not yet approved or cleared by the [place] FDA and has been authorized for detection and/or diagnosis of SARS-CoV-2 by FDA under an Emergency Use Authorization (EUA). This EUA will remain in effect (meaning this test can be used) for the duration of the COVID-19 declaration  under Section 564(b)(1) of the Act, 21 U.S.C. section 360bbb-3(b)(1), unless the authorization is terminated or revoked.     Resp Syncytial Virus by PCR POSITIVE (A) NEGATIVE Final    Comment: (NOTE) Fact Sheet for Patients: EntrepreneurPulse.com.au  Fact Sheet for Healthcare Providers: IncredibleEmployment.be  This test is not yet approved or cleared by the [place] FDA and has been authorized for detection and/or diagnosis of SARS-CoV-2 by FDA under an Emergency Use Authorization (EUA). This EUA will remain in effect (meaning this test can be used) for the duration of the COVID-19 declaration under Section 564(b)(1) of the Act, 21 U.S.C. section 360bbb-3(b)(1), unless the authorization is terminated or revoked.  Performed at [facility], [place] [address], [place], [place] [address]   Blood culture (routine x 2)     Status: None (Preliminary result)   Collection Time: 04/22/22 11:45 PM   Specimen: BLOOD  Result Value Ref Range Status   Specimen Description BLOOD LEFT ARM  Final   Special Requests   Final    BOTTLES DRAWN AEROBIC AND ANAEROBIC Blood Culture adequate volume   Culture   Final    NO GROWTH 1  DAY Performed at [facility], [place] [address] [place], [place] [address]    Report Status PENDING  Incomplete  Blood culture (routine x 2)     Status: None (Preliminary result)   Collection Time: 04/22/22 11:48 PM   Specimen: BLOOD  Result Value Ref Range Status   Specimen Description BLOOD LEFT FOREARM  Final   Special Requests   Final    BOTTLES DRAWN AEROBIC AND ANAEROBIC Blood Culture results may not be optimal due to an excessive volume of blood received in culture bottles   Culture   Final    NO GROWTH 1 DAY Performed at [facility], [place] [address], [place], [place] [address]    Report Status PENDING  Incomplete  Expectorated Sputum Assessment w Gram Stain, Rflx to Resp Cult     Status: None    Collection Time: 04/23/22  2:04 AM   Specimen: Expectorated Sputum  Result Value Ref Range Status   Specimen Description EXPECTORATED SPUTUM  Final   Special Requests NONE  Final   Sputum evaluation   Final    THIS SPECIMEN IS ACCEPTABLE FOR SPUTUM CULTURE Performed at [facility], [place] [address], [place], [place] [address]    Report Status 04/23/2022 FINAL  Final  Culture, Respiratory w Gram Stain     Status: None (Preliminary result)   Collection Time: 04/23/22  2:04 AM  Result Value Ref Range Status   Specimen Description EXPECTORATED SPUTUM  Final   Special Requests NONE Reflexed from [id number]  Final   Gram Stain   Final    ABUNDANT WBC PRESENT,BOTH PMN AND MONONUCLEAR MODERATE GRAM POSITIVE COCCI IN PAIRS FEW GRAM NEGATIVE RODS    Culture   Final    CULTURE REINCUBATED FOR BETTER GROWTH Performed at [facility], [place] [address] [place], [place] [address]    Report Status PENDING  Incomplete         Radiology Studies: VAS [place] LOWER EXTREMITY VENOUS (DVT)  Result Date: 04/24/2022  Lower Venous DVT Study Patient Name:  KIYAAN BLANKENBILLER  Date of Exam:   04/24/2022 Medical Rec #: [id number]       Accession #:    [id number] Date of Birth: 1951-02-14       Patient Gender: M Patient Age:   65 years Exam Location:  [facility] Procedure:      VAS [place] LOWER EXTREMITY VENOUS (DVT) Referring Phys: Bernabe Dorce [place] --------------------------------------------------------------------------------  Indications: Elevated D-dimer 0.97.  Comparison Study: No previous exams Performing Technologist: Jody Hill RVT, RDMS  Examination Guidelines: A complete evaluation includes B-mode imaging, spectral Doppler, color Doppler, and power Doppler as needed of all accessible portions of each vessel. Bilateral testing is considered an integral part of a complete examination. Limited examinations for reoccurring indications may be performed as noted. The reflux portion of the exam is performed  with the patient in reverse Trendelenburg.  +---------+---------------+---------+-----------+----------+--------------+ RIGHT    CompressibilityPhasicitySpontaneityPropertiesThrombus Aging +---------+---------------+---------+-----------+----------+--------------+ CFV      Full           No       Yes                  pulsatile      +---------+---------------+---------+-----------+----------+--------------+ SFJ      Full                                                        +---------+---------------+---------+-----------+----------+--------------+  FV Prox  Full           Yes      Yes                                 +---------+---------------+---------+-----------+----------+--------------+ FV Mid   Full           Yes      Yes                                 +---------+---------------+---------+-----------+----------+--------------+ FV DistalFull           Yes      Yes                                 +---------+---------------+---------+-----------+----------+--------------+ PFV      Full                                                        +---------+---------------+---------+-----------+----------+--------------+ POP      Full           Yes      Yes                                 +---------+---------------+---------+-----------+----------+--------------+ PTV      Full                                                        +---------+---------------+---------+-----------+----------+--------------+ PERO     Full                                                        +---------+---------------+---------+-----------+----------+--------------+   +---------+---------------+---------+-----------+----------+--------------+ LEFT     CompressibilityPhasicitySpontaneityPropertiesThrombus Aging +---------+---------------+---------+-----------+----------+--------------+ CFV      Full           Yes      Yes                                  +---------+---------------+---------+-----------+----------+--------------+ SFJ      Full                                                        +---------+---------------+---------+-----------+----------+--------------+ FV Prox  Full           Yes      Yes                                 +---------+---------------+---------+-----------+----------+--------------+ FV Mid   Full  Yes      Yes                                 +---------+---------------+---------+-----------+----------+--------------+ FV DistalFull           Yes      Yes                                 +---------+---------------+---------+-----------+----------+--------------+ PFV      Full                                                        +---------+---------------+---------+-----------+----------+--------------+ POP      Full           Yes      Yes                                 +---------+---------------+---------+-----------+----------+--------------+ PTV      Full                                                        +---------+---------------+---------+-----------+----------+--------------+ PERO     Full                                                        +---------+---------------+---------+-----------+----------+--------------+     Summary: BILATERAL: - No evidence of deep vein thrombosis seen in the lower extremities, bilaterally. -No evidence of popliteal cyst, bilaterally.   *See table(s) above for measurements and observations. Electronically signed by Monica [place] MD on 04/24/2022 at 12:45:20 PM.    Final    DG Chest 2 View  Result Date: 04/22/2022 CLINICAL DATA:  Shortness of breath, cough EXAM: CHEST - 2 VIEW COMPARISON:  Previous examinations including the study done earlier today FINDINGS: Cardiac size is within normal limits. Increase in AP diameter of chest suggests COPD. Small patchy infiltrate is seen in left lower lung field. This finding has not  changed significantly in comparison with the immediate previous study done earlier today. This finding was not distinctly seen in an earlier examination done on 05/29/2021. In the lateral view, the infiltrate appears to be anterior, possibly within lingula. There is peribronchial thickening. There is no pleural effusion or pneumothorax. IMPRESSION: There is small patchy infiltrate in left lower lung field suggesting interstitial pneumonia. There are no new infiltrates or signs of pulmonary edema in comparison with the study done earlier today. There is no pleural effusion. Electronically Signed   By: Elmer Picker M.D.   On: 04/22/2022 17:30        Scheduled Meds:  acetaminophen  1,000 mg Oral Once   arformoterol  15 mcg Nebulization BID   azithromycin  500 mg Oral Daily   budesonide (PULMICORT) nebulizer solution  0.5 mg Nebulization BID   enoxaparin (LOVENOX) injection  40 mg  Subcutaneous Daily   guaiFENesin  600 mg Oral BID   ipratropium-albuterol  3 mL Inhalation TID   predniSONE  40 mg Oral Q breakfast   sodium chloride flush  3 mL Intravenous Q12H   Continuous Infusions:  cefTRIAXone (ROCEPHIN)  IV 2 g (04/23/22 2232)     LOS: 1 day    Time spent: 48 minutes spent on chart review, discussion with nursing staff, consultants, updating family and interview/physical exam; more than 50% of that time was spent in counseling and/or coordination of care.    Britini Garcilazo J [place] DO [facility] Available via Epic secure chat 7am-7pm After these hours, please refer to coverage provider listed on [email or web address] 04/24/2022, 2:44 PM

## 2022-04-24 NOTE — TOC Initial Note (Signed)
Transition of Care [facility]) - Initial/Assessment Note    Patient Details  Name: Kenneth Weber MRN: [id number] Date of Birth: Sep 17, 1950  Transition of Care [facility]) CM/SW Contact:    Kenneth Lesches, RN Phone Number: 04/24/2022, 3:54 PM  Clinical Narrative:                 Presents with c/o SOB/ acute hypoxic respiratory failure /CAP.  NCM spoke with pt regarding d/c planning. Pt is from home with wife. PTA independent with ADL's, no DME usage.Works part-time.   Pt currently on oxygen. NCM watching for potential  home need   TOC team following and will assist with needs...Marland KitchenMarland Kitchen  Planned Disposition: Home Barriers to Discharge: Continued Medical Work up   Patient Goals and CMS Choice            Expected Discharge Plan and Services Planned Disposition: Home   Discharge Planning Services: CM Consult   Living arrangements for the past 2 months: Single Family Home                                      Prior Living Arrangements/Services Living arrangements for the past 2 months: Single Family Home Lives with:: Spouse Patient language and need for interpreter reviewed:: Yes        Need for Family Participation in Patient Care: Yes (Comment)     Criminal Activity/Legal Involvement Pertinent to Current Situation/Hospitalization: No - Comment as needed  Activities of Daily Living      Permission Sought/Granted   Permission granted to share information with : Yes, Verbal Permission Granted  Share Information with NAME: Kenneth Weber  [facility]  [phone]           Emotional Assessment Appearance:: Appears stated age Attitude/Demeanor/Rapport: Engaged   Orientation: : Oriented to Self, Oriented to Place, Oriented to  Time, Oriented to Situation Alcohol / Substance Use: Not Applicable    Admission diagnosis:  Pneumonia [J18.9] Acute respiratory failure with hypoxia (HCC) [J96.01] Community acquired pneumonia of left lower lobe of lung [J18.9] Patient  Active Problem List   Diagnosis Date Noted   Pneumonia 04/23/2022   Acute respiratory failure with hypoxia (HCC) 04/23/2022   Hypertension 04/23/2022   COPD with acute exacerbation (HCC) 04/23/2022   Bilateral lower extremity edema 04/23/2022   Former smoker 12/10/2021   COPD GOLD 3  10/10/2020   PCP:  Kenneth Joe, MD Pharmacy:   [facility] - [address] ([place]), [place] - [address] [place] [address] Phone: [phone] Fax: [phone]  [facility] - [place], [place] - [address] [place] [address] Phone: [phone] Fax: [phone]     Social Determinants of Health (SDOH) Social History: SDOH Screenings   Tobacco Use: Medium Risk (04/23/2022)   SDOH Interventions:     Readmission Risk Interventions     No data to display

## 2022-04-25 ENCOUNTER — Other Ambulatory Visit (HOSPITAL_COMMUNITY): Payer: Self-pay

## 2022-04-25 ENCOUNTER — Telehealth (HOSPITAL_COMMUNITY): Payer: Self-pay

## 2022-04-25 DIAGNOSIS — I4891 Unspecified atrial fibrillation: Secondary | ICD-10-CM | POA: Diagnosis not present

## 2022-04-25 DIAGNOSIS — J189 Pneumonia, unspecified organism: Secondary | ICD-10-CM | POA: Diagnosis not present

## 2022-04-25 LAB — BASIC METABOLIC PANEL
Anion gap: 8 (ref 5–15)
BUN: 46 mg/dL — ABNORMAL HIGH (ref 8–23)
CO2: 28 mmol/L (ref 22–32)
Calcium: 8.6 mg/dL — ABNORMAL LOW (ref 8.9–10.3)
Chloride: 106 mmol/L (ref 98–111)
Creatinine, Ser: 1.22 mg/dL (ref 0.61–1.24)
GFR, Estimated: >60 mL/min (ref >60)
Glucose, Bld: 107 mg/dL — ABNORMAL HIGH (ref 70–99)
Potassium: 3.8 mmol/L (ref 3.5–5.1)
Sodium: 142 mmol/L (ref 135–145)

## 2022-04-25 LAB — CBC
HCT: 28 % — ABNORMAL LOW (ref 39.0–52.0)
Hemoglobin: 9.5 g/dL — ABNORMAL LOW (ref 13.0–17.0)
MCH: 32.9 pg (ref 26.0–34.0)
MCHC: 33.9 g/dL (ref 30.0–36.0)
MCV: 96.9 fL (ref 80.0–100.0)
Platelets: 284 10*3/uL (ref 150–400)
RBC: 2.89 MIL/uL — ABNORMAL LOW (ref 4.22–5.81)
RDW: 12.1 % (ref 11.5–15.5)
WBC: 18.5 10*3/uL — ABNORMAL HIGH (ref 4.0–10.5)
nRBC: 0 % (ref 0.0–0.2)

## 2022-04-25 LAB — T4, FREE: Free T4: 1.05 ng/dL (ref 0.61–1.12)

## 2022-04-25 LAB — CULTURE, RESPIRATORY W GRAM STAIN: Culture: NORMAL

## 2022-04-25 LAB — TSH: TSH: 0.158 u[IU]/mL — ABNORMAL LOW (ref 0.350–4.500)

## 2022-04-25 MED ORDER — DILTIAZEM LOAD VIA INFUSION: 15.0000 mg | Freq: Once | INTRAVENOUS | Status: DC

## 2022-04-25 MED ORDER — METOPROLOL TARTRATE 5 MG/5ML IV SOLN
2.5000 mg | Freq: Once | INTRAVENOUS | Status: AC
  Administered 2022-04-25: 2.5 mg via INTRAVENOUS
  Filled 2022-04-25: qty 5

## 2022-04-25 MED ORDER — LEVALBUTEROL HCL 0.63 MG/3ML IN NEBU
0.6300 mg | INHALATION_SOLUTION | Freq: Three times a day (TID) | RESPIRATORY_TRACT | Status: DC
  Administered 2022-04-25 – 2022-04-26 (×4): 0.63 mg via RESPIRATORY_TRACT
  Filled 2022-04-25 (×7): qty 3

## 2022-04-25 MED ORDER — LEVALBUTEROL HCL 0.63 MG/3ML IN NEBU
0.6300 mg | INHALATION_SOLUTION | Freq: Four times a day (QID) | RESPIRATORY_TRACT | Status: DC | PRN
  Administered 2022-04-26: 0.63 mg via RESPIRATORY_TRACT
  Filled 2022-04-25: qty 3

## 2022-04-25 MED ORDER — APIXABAN 5 MG PO TABS
5.0000 mg | ORAL_TABLET | Freq: Two times a day (BID) | ORAL | Status: DC
  Administered 2022-04-25 – 2022-04-27 (×4): 5 mg via ORAL
  Filled 2022-04-25 (×4): qty 1

## 2022-04-25 MED ORDER — SENNOSIDES-DOCUSATE SODIUM 8.6-50 MG PO TABS
1.0000 | ORAL_TABLET | Freq: Two times a day (BID) | ORAL | Status: DC
  Administered 2022-04-25 – 2022-05-01 (×12): 1 via ORAL
  Filled 2022-04-25 (×12): qty 1

## 2022-04-25 MED ORDER — DILTIAZEM HCL 25 MG/5ML IV SOLN
15.0000 mg | Freq: Once | INTRAVENOUS | Status: AC
  Administered 2022-04-25: 15 mg via INTRAVENOUS
  Filled 2022-04-25: qty 5

## 2022-04-25 MED ORDER — METOPROLOL TARTRATE 25 MG PO TABS
25.0000 mg | ORAL_TABLET | Freq: Two times a day (BID) | ORAL | Status: DC
  Administered 2022-04-25 – 2022-04-27 (×6): 25 mg via ORAL
  Filled 2022-04-25 (×7): qty 1

## 2022-04-25 NOTE — Progress Notes (Signed)
Pt with new onset of afib. Pt is asymptomatic. EKG done and shows afib with RVR. HR 120s to 140s per CCMD. Paged TRH oncall. See orders on [facility].   04/25/22 0158  Assess: MEWS Score  Temp 98.4 F (36.9 C)  BP 115/82  MAP (mmHg) 92  Pulse Rate (!) 141  Resp (!) 28  Level of Consciousness Alert  SpO2 94 %  O2 Device Nasal Cannula  O2 Flow Rate (L/min) 2.5 L/min  Assess: MEWS Score  MEWS Temp 0  MEWS Systolic 0  MEWS Pulse 3  MEWS RR 2  MEWS LOC 0  MEWS Score 5  MEWS Score Color Red  Assess: if the MEWS score is Yellow or Red  Were vital signs taken at a resting state? Yes  Focused Assessment Change from prior assessment (see assessment flowsheet)  Does the patient meet 2 or more of the SIRS criteria? Yes  Does the patient have a confirmed or suspected source of infection? Yes  Provider and Rapid Response Notified? Yes  MEWS guidelines implemented *See Row Information* Yes  Treat  MEWS Interventions Escalated (See documentation below)  Take Vital Signs  Increase Vital Sign Frequency  Red: Q 1hr X 4 then Q 4hr X 4, if remains red, continue Q 4hrs  Escalate  MEWS: Escalate Red: discuss with charge nurse/RN and provider, consider discussing with RRT  Notify: Charge Nurse/RN  Name of Charge Nurse/RN Notified Alona Bene RN  Date Charge Nurse/RN Notified 04/25/22  Time Charge Nurse/RN Notified 0203  Provider Notification  Provider Name/Title A. Virgel Manifold  Date Provider Notified 04/25/22  Time Provider Notified [phone]  Method of Notification Page  Notification Reason New onset of dysrhythmia  Provider response See new orders  Notify: Rapid Response  Name of Rapid Response RN Notified Mindy RN  Date Rapid Response Notified 04/25/22  Time Rapid Response Notified 0222  Document  Progress note created (see row info) Yes  Assess: SIRS CRITERIA  SIRS Temperature  0  SIRS Pulse 1  SIRS Respirations  1  SIRS WBC 0  SIRS Score Sum  2

## 2022-04-25 NOTE — TOC Benefit Eligibility Note (Signed)
Patient Product/process development scientist completed.    The patient is currently admitted and upon discharge could be taking Eliquis 5MG .  The current 30 day co-pay is $129.95.   The patient is insured through Medicare Part D   [employer], CPHT Pharmacy Patient Advocate Specialist [facility] Patient Advocate Team Direct Number: [phone] Fax: [phone]

## 2022-04-25 NOTE — Progress Notes (Signed)
SATURATION QUALIFICATIONS: (This note is used to comply with regulatory documentation for home oxygen)  Patient Saturations on Room Air at Rest = 91%  Patient Saturations on Room Air while Ambulating = 86%  Patient Saturations on 3 Liters of oxygen while Ambulating = 93%  Please briefly explain why patient needs home oxygen:

## 2022-04-25 NOTE — Telephone Encounter (Signed)
Pharmacy Patient Advocate Encounter  Insurance verification completed.    The patient is insured through [facility] PART D   The patient is currently admitted and ran test claims for the following: ELIQUIS 5MG .  Copays and coinsurance results were relayed to Inpatient clinical team.

## 2022-04-25 NOTE — Discharge Instructions (Signed)

## 2022-04-25 NOTE — Progress Notes (Signed)
PROGRESS NOTE    Kenneth Weber  [id number] DOB: 1951/04/15 DOA: 04/22/2022 PCP: [name], MD    Brief Narrative:   Kenneth Weber is a 71 y.o. male with past medical history significant for HTN, COPD who presented to [facility] ED on 12/18 from urgent care with progressive shortness of breath.  Recently diagnosed with RSV on 12/8.  Was prescribed a cough medicine and nasal spray without much relief.  Now developing a productive cough and return to urgent care on 12/18 was found to be hypoxic and directed to the ED for further evaluation.  In the ED, temperature 99.4 F, HR 99, RR 26, BP 153/74, SpO2 84% on room air.  WBC 13.9, hemoglobin 11.6, platelets 265.  Sodium 138, potassium 3.9, chloride 103, CO2 25, glucose 117, BUN 13, Cram 1.22.  AST 19, ALT 17, total bilirubin 1.0.  BNP 36.6.  Lactic acid 0.8.  Procalcitonin 0.24.  Cova-19 PCR negative.  Influenza A/B PCR negative.  RSV positive.  Chest x-ray with small patchy infiltrate left lower lung suggestive of interstitial pneumonia.  Patient was given acetaminophen, albuterol, Atrovent, IV Solu-Medrol, ceftriaxone, azithromycin.  Patient was started on supplemental oxygen.  TRH consulted for further evaluation and management of commune acquired pneumonia, complicated by COPD exacerbation and respiratory syncytial virus infection.  Assessment & Plan:   Acute hypoxic respiratory failure, POA Patient presenting to ED with progressive shortness of breath Notably hypoxic on presentation with SpO2 84%; not oxygen dependent. Etiology likely multifactorial in the setting of RSV infection, community-acquired pneumonia and COPD exacerbation. --Continue supplemental oxygen, maintain SpO2 greater than 88%; now titrated down to 2.5 L nasal cannula --Continue treatment as below  Community acquired pneumonia On presentation in the ED, temperature 99.4, WBC count elevated 13.9 with chest x-ray findings of infiltrates left lower lung suggestive of  pneumonia. -- Azithromycin 500 mg p.o. daily -- Ceftriaxone 2 g IV every 24 hours -- Mucinex 600 mg p.o. twice daily -- Continue supplemental oxygen, maintain SpO2 > than 88%, currently on 2.5 L nasal cannula -- Incentive spirometer/flutter valve -- CBC daily  COPD exacerbation Not oxygen dependent at baseline.  Currently managed with [facility] inhaler outpatient. -- Pulmicort neb twice daily -- Brovana neb twice daily -- DuoNeb every 6 hours -- Prednisone 40 mg p.o. daily -- Albuterol every 4 hours as needed shortness of breath/wheezing  Atrial fibrillation with RVR, new onset Overnight, patient became tachycardic, EKG confirms atrial fibrillation with RVR.  No prior history.  CHA2DS2-VASc 2 score = 2 for age and HTN history -- TSH low at 0.158, check free T4 -- TTE: Pending -- Metoprolol tartrate 25 mg p.o. twice daily -- Start Eliquis 5 mg p.o. twice daily -- Continue monitor on telemetry  Essential hypertension Patient with borderline hypotension on admission.  Home medication regimen includes amlodipine 10 mg p.o. daily, doxazosin 4 mg p.o. nightly. -- Continue to hold home antihypertensives for now -- Hydralazine 25mg  PO q6h PRN SBP >165 or DBP >110 -- Monitor BP closely  Bilateral lower extremity edema Unclear if side effect from amlodipine use.  D-dimer elevated 0.97, bilateral duplex vascular ultrasound negative for DVT.  Hx tobacco use disorder Patient reports continues with tobacco cessation over the past 8 years.  Continue encourage full cessation.   DVT prophylaxis: enoxaparin (LOVENOX) injection 40 mg Start: 04/23/22 1000    Code Status: Full Code Family Communication: Updated spouse present at bedside this morning  Disposition Plan:  Level of care: Telemetry Medical Status is: Inpatient Remains inpatient  appropriate because: IV antibiotics/steroids, needs weaning from supplemental oxygen, TTE and needs better control of A-fib with RVR which is new onset  overnight    Consultants:  None  Procedures:  TTE: Pending  Antimicrobials:  Azithromycin 12/18>> Ceftriaxone 12/18>>   Subjective: Patient seen examined bedside, resting comfortably.  Lying in bed.  No specific complaints this morning, overall feels much improved.  Overnight patient developed A-fib with RVR, no previous history of A-fib.  Patient asymptomatic.  No other complaints or concerns at this time.  Patient denies headache, no dizziness, no visual changes, no current fever/chills/night sweats, no nausea cefonicid diarrhea, no chest pain, no palpitations, no abdominal pain, no focal weakness, no fatigue, no paresthesias.  No acute events overnight per nursing staff.  Objective: Vitals:   04/25/22 0210 04/25/22 0249 04/25/22 0348 04/25/22 0841  BP: 114/69 113/79  102/63  Pulse: (!) 117 (!) 111 (!) 117 (!) 116  Resp: (!) 21 (!) 24  20  Temp: 98 F (36.7 C)     TempSrc: Oral Oral    SpO2: 92% 93%  93%  Weight:      Height:        Intake/Output Summary (Last 24 hours) at 04/25/2022 1144 Last data filed at 04/25/2022 0500 Gross per 24 hour  Intake 200 ml  Output 500 ml  Net -300 ml   Filed Weights   04/22/22 1616  Weight: 84.8 kg    Examination:  Physical Exam: GEN: NAD, alert and oriented x 3, ill in appearance HEENT: NCAT, PERRL, EOMI, sclera clear, MMM PULM: Slightly diminished breath sounds bilateral bases, mild mid to late expiratory wheezing throughout all lung fields, no crackles, normal respiratory effort without accessory muscle use, on 2.5 L nasal cannula with SpO2 96% at rest CV: Tachycardic, irregularly irregular rhythm, w/o M/G/R GI: abd soft, NTND, NABS, no R/G/M MSK: trace bilateral lower extremity peripheral edema, muscle strength globally intact 5/5 bilateral upper/lower extremities NEURO: CN II-XII intact, no focal deficits, sensation to light touch intact PSYCH: normal mood/affect Integumentary: dry/intact, no rashes or wounds    Data  Reviewed: I have personally reviewed following labs and imaging studies  CBC: Recent Labs  Lab 04/22/22 1607 04/23/22 0505 04/24/22 0310 04/25/22 0412  WBC 13.9* 14.0* 15.8* 18.5*  NEUTROABS 11.6*  --   --   --   HGB 11.6* 11.1* 10.6* 9.5*  HCT 35.8* 33.0* 30.7* 28.0*  MCV 98.1 96.8 95.6 96.9  PLT 265 256 269 [id number]   Basic Metabolic Panel: Recent Labs  Lab 04/22/22 1607 04/23/22 0505 04/24/22 0310 04/25/22 0412  NA 138 136 139 142  K 3.9 3.6 3.7 3.8  CL 103 102 104 106  CO2 25 22 24 28   GLUCOSE 117* 154* 160* 107*  BUN 13 15 25* 46*  CREATININE 1.22 1.31* 1.28* 1.22  CALCIUM 8.9 8.6* 8.8* 8.6*  MG  --  1.9  --   --    GFR: Estimated Creatinine Clearance: 63.7 mL/min (by C-G formula based on SCr of 1.22 mg/dL). Liver Function Tests: Recent Labs  Lab 04/22/22 1607  AST 19  ALT 17  ALKPHOS 76  BILITOT 1.0  PROT 6.9  ALBUMIN 3.7   No results for input(s): "LIPASE", "AMYLASE" in the last 168 hours. No results for input(s): "AMMONIA" in the last 168 hours. Coagulation Profile: No results for input(s): "INR", "PROTIME" in the last 168 hours. Cardiac Enzymes: No results for input(s): "CKTOTAL", "CKMB", "CKMBINDEX", "TROPONINI" in the last 168 hours. BNP (last 3  results) No results for input(s): "PROBNP" in the last 8760 hours. HbA1C: No results for input(s): "HGBA1C" in the last 72 hours. CBG: No results for input(s): "GLUCAP" in the last 168 hours. Lipid Profile: No results for input(s): "CHOL", "HDL", "LDLCALC", "TRIG", "CHOLHDL", "LDLDIRECT" in the last 72 hours. Thyroid Function Tests: Recent Labs    04/25/22 0412  TSH 0.158*   Anemia Panel: No results for input(s): "VITAMINB12", "FOLATE", "FERRITIN", "TIBC", "IRON", "RETICCTPCT" in the last 72 hours. Sepsis Labs: Recent Labs  Lab 04/22/22 2345 04/23/22 0106 04/23/22 0505 04/24/22 0310  PROCALCITON  --  0.18 0.24 0.28  LATICACIDVEN 0.8 1.3  --   --     Recent Results (from the past 240 hour(s))   Resp panel by RT-PCR (RSV, Flu A&B, Covid) Anterior Nasal Swab     Status: Abnormal   Collection Time: 04/22/22  4:51 PM   Specimen: Anterior Nasal Swab  Result Value Ref Range Status   SARS Coronavirus 2 by RT PCR NEGATIVE NEGATIVE Final    Comment: (NOTE) SARS-CoV-2 target nucleic acids are NOT DETECTED.  The SARS-CoV-2 RNA is generally detectable in upper respiratory specimens during the acute phase of infection. The lowest concentration of SARS-CoV-2 viral copies this assay can detect is 138 copies/mL. A negative result does not preclude SARS-Cov-2 infection and should not be used as the sole basis for treatment or other patient management decisions. A negative result may occur with  improper specimen collection/handling, submission of specimen other than nasopharyngeal swab, presence of viral mutation(s) within the areas targeted by this assay, and inadequate number of viral copies(<138 copies/mL). A negative result must be combined with clinical observations, patient history, and epidemiological information. The expected result is Negative.  Fact Sheet for Patients:  EntrepreneurPulse.com.au  Fact Sheet for Healthcare Providers:  IncredibleEmployment.be  This test is no t yet approved or cleared by the [place] FDA and  has been authorized for detection and/or diagnosis of SARS-CoV-2 by FDA under an Emergency Use Authorization (EUA). This EUA will remain  in effect (meaning this test can be used) for the duration of the COVID-19 declaration under Section 564(b)(1) of the Act, 21 U.S.C.section 360bbb-3(b)(1), unless the authorization is terminated  or revoked sooner.       Influenza A by PCR NEGATIVE NEGATIVE Final   Influenza B by PCR NEGATIVE NEGATIVE Final    Comment: (NOTE) The Xpert Xpress SARS-CoV-2/FLU/RSV plus assay is intended as an aid in the diagnosis of influenza from Nasopharyngeal swab specimens and should not be  used as a sole basis for treatment. Nasal washings and aspirates are unacceptable for Xpert Xpress SARS-CoV-2/FLU/RSV testing.  Fact Sheet for Patients: EntrepreneurPulse.com.au  Fact Sheet for Healthcare Providers: IncredibleEmployment.be  This test is not yet approved or cleared by the [place] FDA and has been authorized for detection and/or diagnosis of SARS-CoV-2 by FDA under an Emergency Use Authorization (EUA). This EUA will remain in effect (meaning this test can be used) for the duration of the COVID-19 declaration under Section 564(b)(1) of the Act, 21 U.S.C. section 360bbb-3(b)(1), unless the authorization is terminated or revoked.     Resp Syncytial Virus by PCR POSITIVE (A) NEGATIVE Final    Comment: (NOTE) Fact Sheet for Patients: EntrepreneurPulse.com.au  Fact Sheet for Healthcare Providers: IncredibleEmployment.be  This test is not yet approved or cleared by the [place] FDA and has been authorized for detection and/or diagnosis of SARS-CoV-2 by FDA under an Emergency Use Authorization (EUA). This EUA will remain in  effect (meaning this test can be used) for the duration of the COVID-19 declaration under Section 564(b)(1) of the Act, 21 U.S.C. section 360bbb-3(b)(1), unless the authorization is terminated or revoked.  Performed at [facility], [address] [place], [place] [address]   Blood culture (routine x 2)     Status: None (Preliminary result)   Collection Time: 04/22/22 11:45 PM   Specimen: BLOOD  Result Value Ref Range Status   Specimen Description BLOOD LEFT ARM  Final   Special Requests   Final    BOTTLES DRAWN AEROBIC AND ANAEROBIC Blood Culture adequate volume   Culture   Final    NO GROWTH 2 DAYS Performed at [facility], [address] [place], [place] [address]    Report Status PENDING  Incomplete  Blood culture (routine x 2)     Status:  None (Preliminary result)   Collection Time: 04/22/22 11:48 PM   Specimen: BLOOD  Result Value Ref Range Status   Specimen Description BLOOD LEFT FOREARM  Final   Special Requests   Final    BOTTLES DRAWN AEROBIC AND ANAEROBIC Blood Culture results may not be optimal due to an excessive volume of blood received in culture bottles   Culture   Final    NO GROWTH 2 DAYS Performed at [facility], [address], [place], [place] [address]    Report Status PENDING  Incomplete  Expectorated Sputum Assessment w Gram Stain, Rflx to Resp Cult     Status: None   Collection Time: 04/23/22  2:04 AM   Specimen: Expectorated Sputum  Result Value Ref Range Status   Specimen Description EXPECTORATED SPUTUM  Final   Special Requests NONE  Final   Sputum evaluation   Final    THIS SPECIMEN IS ACCEPTABLE FOR SPUTUM CULTURE Performed at [facility], [address] [place], [place] [address]    Report Status 04/23/2022 FINAL  Final  Culture, Respiratory w Gram Stain     Status: None   Collection Time: 04/23/22  2:04 AM  Result Value Ref Range Status   Specimen Description EXPECTORATED SPUTUM  Final   Special Requests NONE Reflexed from [id number]  Final   Gram Stain   Final    ABUNDANT WBC PRESENT,BOTH PMN AND MONONUCLEAR MODERATE GRAM POSITIVE COCCI IN PAIRS FEW GRAM NEGATIVE RODS    Culture   Final    MODERATE Normal respiratory flora-no Staph aureus or Pseudomonas seen Performed at [facility], [address] [place], [place] [address]    Report Status 04/25/2022 FINAL  Final         Radiology Studies: VAS [place] LOWER EXTREMITY VENOUS (DVT)  Result Date: 04/24/2022  Lower Venous DVT Study Patient Name:  NAVEEN CLARDY  Date of Exam:   04/24/2022 Medical Rec #: [id number]       Accession #:    [id number] Date of Birth: 1951/04/18       Patient Gender: M Patient Age:   15 years Exam Location:  [facility] Procedure:      VAS [place] LOWER EXTREMITY VENOUS (DVT) Referring  Phys: Rosabel Sermeno [place] --------------------------------------------------------------------------------  Indications: Elevated D-dimer 0.97.  Comparison Study: No previous exams Performing Technologist: Jody Hill RVT, RDMS  Examination Guidelines: A complete evaluation includes B-mode imaging, spectral Doppler, color Doppler, and power Doppler as needed of all accessible portions of each vessel. Bilateral testing is considered an integral part of a complete examination. Limited examinations for  reoccurring indications may be performed as noted. The reflux portion of the exam is performed with the patient in reverse Trendelenburg.  +---------+---------------+---------+-----------+----------+--------------+ RIGHT    CompressibilityPhasicitySpontaneityPropertiesThrombus Aging +---------+---------------+---------+-----------+----------+--------------+ CFV      Full           No       Yes                  pulsatile      +---------+---------------+---------+-----------+----------+--------------+ SFJ      Full                                                        +---------+---------------+---------+-----------+----------+--------------+ FV Prox  Full           Yes      Yes                                 +---------+---------------+---------+-----------+----------+--------------+ FV Mid   Full           Yes      Yes                                 +---------+---------------+---------+-----------+----------+--------------+ FV DistalFull           Yes      Yes                                 +---------+---------------+---------+-----------+----------+--------------+ PFV      Full                                                        +---------+---------------+---------+-----------+----------+--------------+ POP      Full           Yes      Yes                                 +---------+---------------+---------+-----------+----------+--------------+ PTV      Full                                                         +---------+---------------+---------+-----------+----------+--------------+ PERO     Full                                                        +---------+---------------+---------+-----------+----------+--------------+   +---------+---------------+---------+-----------+----------+--------------+ LEFT     CompressibilityPhasicitySpontaneityPropertiesThrombus Aging +---------+---------------+---------+-----------+----------+--------------+ CFV      Full           Yes      Yes                                 +---------+---------------+---------+-----------+----------+--------------+  SFJ      Full                                                        +---------+---------------+---------+-----------+----------+--------------+ FV Prox  Full           Yes      Yes                                 +---------+---------------+---------+-----------+----------+--------------+ FV Mid   Full           Yes      Yes                                 +---------+---------------+---------+-----------+----------+--------------+ FV DistalFull           Yes      Yes                                 +---------+---------------+---------+-----------+----------+--------------+ PFV      Full                                                        +---------+---------------+---------+-----------+----------+--------------+ POP      Full           Yes      Yes                                 +---------+---------------+---------+-----------+----------+--------------+ PTV      Full                                                        +---------+---------------+---------+-----------+----------+--------------+ PERO     Full                                                        +---------+---------------+---------+-----------+----------+--------------+     Summary: BILATERAL: - No evidence of deep vein thrombosis seen in  the lower extremities, bilaterally. -No evidence of popliteal cyst, bilaterally.   *See table(s) above for measurements and observations. Electronically signed by Sherald Hess MD on 04/24/2022 at 12:45:20 PM.    Final         Scheduled Meds:  acetaminophen  1,000 mg Oral Once   arformoterol  15 mcg Nebulization BID   azithromycin  500 mg Oral Daily   budesonide (PULMICORT) nebulizer solution  0.5 mg Nebulization BID   enoxaparin (LOVENOX) injection  40 mg Subcutaneous Daily   guaiFENesin  600 mg Oral BID   levalbuterol  0.63 mg Nebulization Q8H   metoprolol tartrate  25 mg Oral BID  predniSONE  40 mg Oral Q breakfast   senna-docusate  1 tablet Oral BID   sodium chloride flush  3 mL Intravenous Q12H   Continuous Infusions:  cefTRIAXone (ROCEPHIN)  IV 2 g (04/24/22 2236)     LOS: 2 days    Time spent: 48 minutes spent on chart review, discussion with nursing staff, consultants, updating family and interview/physical exam; more than 50% of that time was spent in counseling and/or coordination of care.    Dorotha Hirschi J [place] DO [facility] Available via Epic secure chat 7am-7pm After these hours, please refer to coverage provider listed on [email or web address] 04/25/2022, 11:44 AM

## 2022-04-25 NOTE — Progress Notes (Addendum)
       CROSS COVER NOTE  NAME: Kenneth Weber MRN: [id number] DOB : December 19, 1950    Date of Service   04/25/2022   HPI/Events of Note   0230- Bedside RN reports HR~130s while patient is resting in bed.  Patient denies chest pain, palpitations, shortness of breath.  EKG reviewed shows new onset A-fib RVR.  No prior history noted.  VSS.   15 mg IV Cardizem x 2 given.  Heart rate improved 100-110's.   0330- HR sustaining > 120. IV metoprolol given with HR improvement.    Interventions/ Plan   Above     Anthoney Harada, DNP, [facility] [place]

## 2022-04-25 NOTE — Plan of Care (Addendum)
Pt has a new onset of afib. Pt is asymptomatic. BP stable, HR 120s-140s per tele. EKG was done showing afib with RVR. TRH oncall notified (see orders on MAR).Pt still afib and HR runs 90s-100s at this time. Pt resting comfortably on bed. Cardiac monitoring reordered. Will continue to monitor pt.   Problem: Activity: Goal: Ability to tolerate increased activity will improve Outcome: Progressing   Problem: Clinical Measurements: Goal: Ability to maintain a body temperature in the normal range will improve Outcome: Progressing   Problem: Respiratory: Goal: Ability to maintain adequate ventilation will improve Outcome: Progressing Goal: Ability to maintain a clear airway will improve Outcome: Progressing

## 2022-04-26 ENCOUNTER — Inpatient Hospital Stay (HOSPITAL_COMMUNITY): Payer: Medicare Other

## 2022-04-26 DIAGNOSIS — J189 Pneumonia, unspecified organism: Secondary | ICD-10-CM | POA: Diagnosis not present

## 2022-04-26 DIAGNOSIS — J9601 Acute respiratory failure with hypoxia: Secondary | ICD-10-CM | POA: Diagnosis not present

## 2022-04-26 DIAGNOSIS — I4891 Unspecified atrial fibrillation: Secondary | ICD-10-CM

## 2022-04-26 DIAGNOSIS — I10 Essential (primary) hypertension: Secondary | ICD-10-CM | POA: Diagnosis not present

## 2022-04-26 LAB — BASIC METABOLIC PANEL
Anion gap: 6 (ref 5–15)
BUN: 35 mg/dL — ABNORMAL HIGH (ref 8–23)
CO2: 29 mmol/L (ref 22–32)
Calcium: 8.8 mg/dL — ABNORMAL LOW (ref 8.9–10.3)
Chloride: 107 mmol/L (ref 98–111)
Creatinine, Ser: 1.27 mg/dL — ABNORMAL HIGH (ref 0.61–1.24)
GFR, Estimated: >60 mL/min (ref >60)
Glucose, Bld: 100 mg/dL — ABNORMAL HIGH (ref 70–99)
Potassium: 4.7 mmol/L (ref 3.5–5.1)
Sodium: 142 mmol/L (ref 135–145)

## 2022-04-26 LAB — CBC
HCT: 28.9 % — ABNORMAL LOW (ref 39.0–52.0)
Hemoglobin: 9.5 g/dL — ABNORMAL LOW (ref 13.0–17.0)
MCH: 32.4 pg (ref 26.0–34.0)
MCHC: 32.9 g/dL (ref 30.0–36.0)
MCV: 98.6 fL (ref 80.0–100.0)
Platelets: 297 10*3/uL (ref 150–400)
RBC: 2.93 MIL/uL — ABNORMAL LOW (ref 4.22–5.81)
RDW: 12.3 % (ref 11.5–15.5)
WBC: 15.3 10*3/uL — ABNORMAL HIGH (ref 4.0–10.5)
nRBC: 0 % (ref 0.0–0.2)

## 2022-04-26 LAB — ECHOCARDIOGRAM COMPLETE
Height: 73 in
S' Lateral: 3.3 cm
Weight: 2992 oz

## 2022-04-26 LAB — D-DIMER, QUANTITATIVE: D-Dimer, Quant: 0.81 ug/mL-FEU — ABNORMAL HIGH (ref 0.00–0.50)

## 2022-04-26 LAB — PROCALCITONIN: Procalcitonin: 0.12 ng/mL

## 2022-04-26 LAB — BRAIN NATRIURETIC PEPTIDE: B Natriuretic Peptide: 279.8 pg/mL — ABNORMAL HIGH (ref 0.0–100.0)

## 2022-04-26 LAB — MAGNESIUM: Magnesium: 2.2 mg/dL (ref 1.7–2.4)

## 2022-04-26 MED ORDER — METHYLPREDNISOLONE SODIUM SUCC 125 MG IJ SOLR
80.0000 mg | INTRAMUSCULAR | Status: DC
  Administered 2022-04-26: 80 mg via INTRAVENOUS
  Filled 2022-04-26: qty 2

## 2022-04-26 MED ORDER — IOHEXOL 350 MG/ML SOLN
75.0000 mL | Freq: Once | INTRAVENOUS | Status: AC | PRN
  Administered 2022-04-26: 75 mL via INTRAVENOUS

## 2022-04-26 MED ORDER — DILTIAZEM HCL 30 MG PO TABS
30.0000 mg | ORAL_TABLET | Freq: Four times a day (QID) | ORAL | Status: AC
  Administered 2022-04-26 – 2022-04-29 (×14): 30 mg via ORAL
  Filled 2022-04-26 (×15): qty 1

## 2022-04-26 MED ORDER — LEVALBUTEROL HCL 0.63 MG/3ML IN NEBU
0.6300 mg | INHALATION_SOLUTION | Freq: Three times a day (TID) | RESPIRATORY_TRACT | Status: DC
  Filled 2022-04-26 (×3): qty 3

## 2022-04-26 NOTE — Progress Notes (Signed)
Echocardiogram 2D Echocardiogram has been performed.  Warren Lacy [place]a Shoaf RDCS 04/26/2022, 9:12 AM

## 2022-04-26 NOTE — Progress Notes (Signed)
[facility]                                                                              Kenneth Weber, is a 71 y.o. male, DOB - 04/09/1951, [id number] Admit date - 04/22/2022    Outpatient Primary MD for the patient is Kenneth Joe, MD  LOS - 3  days  Chief Complaint  Patient presents with   Shortness of Breath       Brief summary   Patient is a 71 year old male with HTN, COPD presented from urgent care with progressive shortness of breath.  He was recently diagnosed with RSV on 12/8.  Subsequently developed productive cough and returned to urgent care on 12/18, found to be hypoxic and directed to ED for further evaluation. In ED temp 99.4 F, HR 99, RR 26, O2 sats 84% on room air, WBCs 13.9, creatinine 1.2.  Lactic acid 0.8, procalcitonin 0.24.  COVID-19 PCR negative, flu AMB negative, RSV positive Chest x-ray with small patchy infiltrates left lower lung suggestive of interstitial pneumonia.   Assessment & Plan    Principal Problem:    Acute respiratory failure with hypoxia (HCC) -Presented with progressive shortness of breath, hypoxic in ED, O2 sats 84%, not on O2 at baseline -Continue supplemental O2, wean as tolerated.  O2 sats 93% on 3 L. -Home O2 evaluation prior to discharge. -D-dimer 0.8, still hypoxia, on 3 L, obtain CT angiogram of chest to rule out PE, started on apixaban on 12/21.  Venous Dopplers LE negative for DVT  Active Problems: Community-acquired pneumonia COPD exacerbation -Continue Zithromax, ceftriaxone, Mucinex, O2 supplementation -Continue Pulmicort, Brovana, DuoNebs -Still having bilateral wheezing, continue IV Solu-Medrol 80 mg daily -Continue albuterol every 4 hours as needed -D-dimer 0.8, procalcitonin 0.12  Atrial fibrillation with rapid ventricular response (HCC), acute diastolic CHF -BNP 279.8, HR elevated 104-120 this morning during exam, atrial fibrillation with RVR -Placed on Cardizem 30 mg p.o. every 6 hours, for  now continue beta-blocker -Started on apixaban 5 mg twice daily -2D echo showed EF of 50 to 55%, global hypokinesis, normal right ventricular systolic function -TSH low 0.15, free T4 normal, repeat studies in 4 weeks    Hypertension -BP currently stable however borderline.  Patient was started on metoprolol on 12/21, started on oral Cardizem 30 mg every 6 hours for atrial fibrillation with RVR -Will hold metoprolol if SBP less than 100    Bilateral lower extremity edema Could be side effect from amlodipine, venous Dopplers negative for DVT  Tobacco use disorder -Smoking cessation counseling done   Code Status: Full CODE STATUS DVT Prophylaxis:   apixaban (ELIQUIS) tablet 5 mg   Level of Care: Level of care: Telemetry Medical Family Communication: Updated patient Disposition Plan:      Remains inpatient appropriate: Hopefully DC home in 1 or 2 days once wheezing improves, heart rate improves   Procedures:  2D echo   Consultants:   None  Antimicrobials:   Anti-infectives (From admission, onward)    Start     Dose/Rate Route Frequency Ordered Stop   04/23/22 2200  cefTRIAXone (ROCEPHIN) 2 g in sodium chloride 0.9 % 100 mL  IVPB        2 g 200 mL/hr over 30 Minutes Intravenous Every 24 hours 04/23/22 0205 04/27/22 2159   04/23/22 1800  azithromycin (ZITHROMAX) tablet 500 mg        500 mg Oral Daily 04/23/22 0205 04/26/22 0849   04/22/22 2330  cefTRIAXone (ROCEPHIN) 2 g in sodium chloride 0.9 % 100 mL IVPB  Status:  Discontinued        2 g 200 mL/hr over 30 Minutes Intravenous Every 24 hours 04/22/22 2325 04/23/22 0206   04/22/22 2330  azithromycin (ZITHROMAX) 500 mg in sodium chloride 0.9 % 250 mL IVPB  Status:  Discontinued        500 mg 250 mL/hr over 60 Minutes Intravenous Every 24 hours 04/22/22 2325 04/23/22 0207          Medications  apixaban  5 mg Oral BID   arformoterol  15 mcg Nebulization BID   budesonide (PULMICORT) nebulizer solution  0.5 mg  Nebulization BID   diltiazem  30 mg Oral Q6H   guaiFENesin  600 mg Oral BID   levalbuterol  0.63 mg Nebulization Q8H   methylPREDNISolone (SOLU-MEDROL) injection  80 mg Intravenous Q24H   metoprolol tartrate  25 mg Oral BID   senna-docusate  1 tablet Oral BID   sodium chloride flush  3 mL Intravenous Q12H      Subjective:   Kenneth Weber was seen and examined today.  Noted to be in atrial fibrillation with RVR, heart rate 10 2-1 20 during exam.  Still has wheezing, shortness of breath, no fevers.   Objective:   Vitals:   04/25/22 2131 04/25/22 2136 04/26/22 0835 04/26/22 0908  BP:   117/77   Pulse:   (!) 104   Resp:   20   Temp:   97.6 F (36.4 C)   TempSrc:   Oral   SpO2: 96% 100% 92% 93%  Weight:      Height:        Intake/Output Summary (Last 24 hours) at 04/26/2022 1328 Last data filed at 04/25/2022 1846 Gross per 24 hour  Intake --  Output 400 ml  Net -400 ml     Wt Readings from Last 3 Encounters:  04/22/22 84.8 kg  12/31/21 85.3 kg  12/23/21 85.3 kg     Exam General: Alert and oriented x 3, NAD Cardiovascular: Irregularly irregular, tachycardia Respiratory: Bilateral wheezing to all lung fields Gastrointestinal: Soft, nontender, nondistended, + bowel sounds Ext: trace pedal edema bilaterally Neuro: Strength 5/5 upper and lower extremities bilaterally Psych: Normal affect and demeanor, alert and oriented x3     Data Reviewed:  I have personally reviewed following labs    CBC Lab Results  Component Value Date   WBC 15.3 (H) 04/26/2022   RBC 2.93 (L) 04/26/2022   HGB 9.5 (L) 04/26/2022   HCT 28.9 (L) 04/26/2022   MCV 98.6 04/26/2022   MCH 32.4 04/26/2022   PLT 297 04/26/2022   MCHC 32.9 04/26/2022   RDW 12.3 04/26/2022   LYMPHSABS 0.8 04/22/2022   MONOABS 1.4 (H) 04/22/2022   EOSABS 0.0 04/22/2022   BASOSABS 0.0 [id number]     Last metabolic panel Lab Results  Component Value Date   NA 142 04/26/2022   K 4.7 04/26/2022   CL 107  04/26/2022   CO2 29 04/26/2022   BUN 35 (H) 04/26/2022   CREATININE 1.27 (H) 04/26/2022   GLUCOSE 100 (H) 04/26/2022   GFRNONAA >60 04/26/2022   CALCIUM 8.8 (  L) 04/26/2022   PROT 6.9 04/22/2022   ALBUMIN 3.7 04/22/2022   BILITOT 1.0 04/22/2022   ALKPHOS 76 04/22/2022   AST 19 04/22/2022   ALT 17 04/22/2022   ANIONGAP 6 04/26/2022    CBG (last 3)  No results for input(s): "GLUCAP" in the last 72 hours.    Coagulation Profile: No results for input(s): "INR", "PROTIME" in the last 168 hours.   Radiology Studies: I have personally reviewed the imaging studies  ECHOCARDIOGRAM COMPLETE  Result Date: 04/26/2022    ECHOCARDIOGRAM REPORT   Patient Name:   PARDEEP ARCOS Date of Exam: 04/26/2022 Medical Rec #:  [id number]      Height:       73.0 in Accession #:    [id number]     Weight:       187.0 lb Date of Birth:  April 12, 1951      BSA:          2.091 m Patient Age:    71 years       BP:           117/77 mmHg Patient Gender: M              HR:           105 bpm. Exam Location:  Inpatient Procedure: 2D Echo, Color Doppler and Cardiac Doppler Indications:    I48.91* Unspecified atrial fibrillation  History:        Patient has no prior history of Echocardiogram examinations.                 COPD, Arrythmias:Atrial Fibrillation; Risk Factors:Hypertension                 and Dyslipidemia.  Sonographer:    Raquel Sarna Senior RDCS Referring Phys: [id number] ERIC J [place]  Sonographer Comments: Suboptimal parasternals due to lung interference, active URI. IMPRESSIONS  1. EF challenging in the setting of atrial fibrillation . Left ventricular ejection fraction, by estimation, is 50 to 55%. The left ventricle has low normal function. The left ventricle demonstrates global hypokinesis. Indeterminate diastolic filling due to E-A fusion.  2. Right ventricular systolic function is normal. The right ventricular size is normal. Tricuspid regurgitation signal is inadequate for assessing PA pressure.  3. Left atrial size  was mildly dilated.  4. No evidence of mitral valve regurgitation.  5. The aortic valve was not well visualized. Aortic valve regurgitation is not visualized.  6. The inferior vena cava is normal in size with greater than 50% respiratory variability, suggesting right atrial pressure of 3 mmHg. Comparison(s): No prior Echocardiogram. FINDINGS  Left Ventricle: EF challenging in the setting of atrial fibrillation. Left ventricular ejection fraction, by estimation, is 50 to 55%. The left ventricle has low normal function. The left ventricle demonstrates global hypokinesis. The left ventricular internal cavity size was normal in size. There is no left ventricular hypertrophy. Indeterminate diastolic filling due to E-A fusion. Right Ventricle: The right ventricular size is normal. Right ventricular systolic function is normal. Tricuspid regurgitation signal is inadequate for assessing PA pressure. Left Atrium: Left atrial size was mildly dilated. Right Atrium: Right atrial size was normal in size. Pericardium: There is no evidence of pericardial effusion. Mitral Valve: No evidence of mitral valve regurgitation. Tricuspid Valve: Tricuspid valve regurgitation is not demonstrated. Aortic Valve: The aortic valve was not well visualized. Aortic valve regurgitation is not visualized. Pulmonic Valve: Pulmonic valve regurgitation is not visualized. Aorta: The aortic root and ascending aorta are structurally normal,  with no evidence of dilitation. Venous: The inferior vena cava is normal in size with greater than 50% respiratory variability, suggesting right atrial pressure of 3 mmHg. IAS/Shunts: No atrial level shunt detected by color flow Doppler.  LEFT VENTRICLE PLAX 2D LVIDd:         4.30 cm LVIDs:         3.30 cm LV PW:         0.90 cm LV IVS:        0.90 cm LVOT diam:     2.20 cm LV SV:         59 LV SV Index:   28 LVOT Area:     3.80 cm  RIGHT VENTRICLE RV S prime:     11.00 cm/s TAPSE (M-mode): 2.5 cm LEFT ATRIUM              Index        RIGHT ATRIUM           Index LA diam:        4.00 cm 1.91 cm/m   RA Area:     22.20 cm LA Vol (A2C):   78.7 ml 37.64 ml/m  RA Volume:   65.00 ml  31.08 ml/m LA Vol (A4C):   63.5 ml 30.37 ml/m LA Biplane Vol: 74.5 ml 35.63 ml/m  AORTIC VALVE LVOT Vmax:   74.40 cm/s LVOT Vmean:  50.600 cm/s LVOT VTI:    0.156 m  AORTA Ao Root diam: 3.30 cm Ao Asc diam:  3.70 cm  SHUNTS Systemic VTI:  0.16 m Systemic Diam: 2.20 cm Phineas Inches Electronically signed by Phineas Inches Signature Date/Time: 04/26/2022/10:07:43 AM    Final        Estill Cotta M.D. [facility] 04/26/2022, 1:28 PM  Available via Epic secure chat 7am-7pm After 7 pm, please refer to night coverage provider listed on amion.

## 2022-04-27 DIAGNOSIS — I4891 Unspecified atrial fibrillation: Secondary | ICD-10-CM | POA: Diagnosis not present

## 2022-04-27 DIAGNOSIS — R600 Localized edema: Secondary | ICD-10-CM | POA: Diagnosis not present

## 2022-04-27 DIAGNOSIS — J189 Pneumonia, unspecified organism: Secondary | ICD-10-CM | POA: Diagnosis not present

## 2022-04-27 DIAGNOSIS — J9601 Acute respiratory failure with hypoxia: Secondary | ICD-10-CM | POA: Diagnosis not present

## 2022-04-27 LAB — HEMOGLOBIN AND HEMATOCRIT, BLOOD
HCT: 35.3 % — ABNORMAL LOW (ref 39.0–52.0)
Hemoglobin: 11.3 g/dL — ABNORMAL LOW (ref 13.0–17.0)

## 2022-04-27 LAB — PROCALCITONIN: Procalcitonin: 0.22 ng/mL

## 2022-04-27 LAB — OCCULT BLOOD X 1 CARD TO LAB, STOOL: Fecal Occult Bld: POSITIVE — AB

## 2022-04-27 LAB — T3: T3, Total: 57 ng/dL — ABNORMAL LOW (ref 71–180)

## 2022-04-27 MED ORDER — POLYETHYLENE GLYCOL 3350 17 G PO PACK
17.0000 g | PACK | Freq: Two times a day (BID) | ORAL | Status: DC
  Administered 2022-04-27 – 2022-04-29 (×4): 17 g via ORAL
  Filled 2022-04-27 (×5): qty 1

## 2022-04-27 MED ORDER — PREDNISONE 20 MG PO TABS
40.0000 mg | ORAL_TABLET | Freq: Every day | ORAL | Status: DC
  Administered 2022-04-27 – 2022-04-30 (×4): 40 mg via ORAL
  Filled 2022-04-27 (×4): qty 2

## 2022-04-27 MED ORDER — MELATONIN 5 MG PO TABS
5.0000 mg | ORAL_TABLET | Freq: Every day | ORAL | Status: DC
  Administered 2022-04-27 – 2022-04-30 (×4): 5 mg via ORAL
  Filled 2022-04-27 (×4): qty 1

## 2022-04-27 MED ORDER — BISACODYL 5 MG PO TBEC
5.0000 mg | DELAYED_RELEASE_TABLET | Freq: Once | ORAL | Status: AC
  Administered 2022-04-27: 5 mg via ORAL
  Filled 2022-04-27: qty 1

## 2022-04-27 MED ORDER — LEVALBUTEROL HCL 0.63 MG/3ML IN NEBU: 0.6300 mg | INHALATION_SOLUTION | Freq: Four times a day (QID) | RESPIRATORY_TRACT | Status: DC | PRN

## 2022-04-27 MED ORDER — FUROSEMIDE 10 MG/ML IJ SOLN
40.0000 mg | Freq: Once | INTRAMUSCULAR | Status: AC
  Administered 2022-04-27: 40 mg via INTRAVENOUS
  Filled 2022-04-27: qty 4

## 2022-04-27 MED ORDER — APIXABAN 5 MG PO TABS
5.0000 mg | ORAL_TABLET | Freq: Two times a day (BID) | ORAL | Status: DC
  Administered 2022-04-27: 5 mg via ORAL
  Filled 2022-04-27: qty 1

## 2022-04-27 NOTE — Progress Notes (Addendum)
[facility]                                                                              Kenneth Weber, is a 71 y.o. male, DOB - 09-Aug-1950, [id number] Admit date - 04/22/2022    Outpatient Primary MD for the patient is Kenneth Contras, MD  LOS - 4  days  Chief Complaint  Patient presents with   Shortness of Breath       Brief summary   Patient is a 71 year old male with HTN, COPD presented from urgent care with progressive shortness of breath.  He was recently diagnosed with RSV on 12/8.  Subsequently developed productive cough and returned to urgent care on 12/18, found to be hypoxic and directed to ED for further evaluation. In ED temp 99.4 F, HR 99, RR 26, O2 sats 84% on room air, WBCs 13.9, creatinine 1.2.  Lactic acid 0.8, procalcitonin 0.24.  COVID-19 PCR negative, flu AMB negative, RSV positive Chest x-ray with small patchy infiltrates left lower lung suggestive of interstitial pneumonia.   Assessment & Plan    Principal Problem:    Acute respiratory failure with hypoxia (HCC) -Presented with progressive shortness of breath, hypoxic in ED, O2 sats 84%, not on O2 at baseline -Continue supplemental O2, wean as tolerated.  -Home O2 evaluation prior to discharge. -CTA negative for acute PE, venous Dopplers negative for DVT -O2 sat 91% on 2.5 L O2 via [place], wean O2 as tolerated  Active Problems: Community-acquired pneumonia COPD exacerbation -Completed IV Rocephin  -Continue Pulmicort, Brovana, DuoNebs  -Transition to oral prednisone 40 mg daily  Atrial fibrillation with rapid ventricular response (HCC), acute diastolic CHF -BNP 123456 -Heart rate improving, continue Cardizem 30 mg every 6 hours, metoprolol 25 mg twice daily -2D echo showed EF of 50 to 55%, global hypokinesis, normal right ventricular systolic function -TSH low 0.15, free T4 normal, repeat studies in 4 weeks -Ordered Lasix 40 mg IV x 1 -Started on apixaban, currently on hold.   CHA2DS2-VASc 2 for age, hypertension (no prior history of CHF, PAD or CAD)- low moderate risk, ok with aspirin or AC   Constipation -Per patient he has been constipated for last 6 days, declined enema -Placed on scheduled MiraLAX, already on Senokot-S, added Dulcolax -Patient had a BM, reported to his nurse that it was black, tarry, ordered H&H and FOBT -Hold apixaban Addendum 3:10pm H/H stable, better Hb 11.3  If FOBT + or persistent melena, will hold eliquis and consult GI     Hypertension -BP currently stable, continue Cardizem, metoprolol     Bilateral lower extremity edema Could be side effect from amlodipine, venous Dopplers negative for DVT  Tobacco use disorder -Smoking cessation counseling done   Code Status: Full CODE STATUS DVT Prophylaxis:     Level of Care: Level of care: Telemetry Medical Family Communication: Updated patient Disposition Plan:      Remains inpatient appropriate: Hopefully DC home in 1 or 2 days once wheezing improves, heart rate improves   Procedures:  2D echo   Consultants:   None  Antimicrobials:   Anti-infectives (From admission, onward)    Start  Dose/Rate Route Frequency Ordered Stop   04/23/22 2200  cefTRIAXone (ROCEPHIN) 2 g in sodium chloride 0.9 % 100 mL IVPB        2 g 200 mL/hr over 30 Minutes Intravenous Every 24 hours 04/23/22 0205 04/26/22 2238   04/23/22 1800  azithromycin (ZITHROMAX) tablet 500 mg        500 mg Oral Daily 04/23/22 0205 04/26/22 0849   04/22/22 2330  cefTRIAXone (ROCEPHIN) 2 g in sodium chloride 0.9 % 100 mL IVPB  Status:  Discontinued        2 g 200 mL/hr over 30 Minutes Intravenous Every 24 hours 04/22/22 2325 04/23/22 0206   04/22/22 2330  azithromycin (ZITHROMAX) 500 mg in sodium chloride 0.9 % 250 mL IVPB  Status:  Discontinued        500 mg 250 mL/hr over 60 Minutes Intravenous Every 24 hours 04/22/22 2325 04/23/22 0207          Medications  arformoterol  15 mcg Nebulization BID    budesonide (PULMICORT) nebulizer solution  0.5 mg Nebulization BID   diltiazem  30 mg Oral Q6H   guaiFENesin  600 mg Oral BID   melatonin  5 mg Oral QHS   metoprolol tartrate  25 mg Oral BID   polyethylene glycol  17 g Oral BID   predniSONE  40 mg Oral QAC breakfast   senna-docusate  1 tablet Oral BID   sodium chloride flush  3 mL Intravenous Q12H      Subjective:   [name] was seen and examined today.  This morning reported to be constipated for last 6 days.  Heart rate much better controlled on oral Cardizem.  Wheezing +, no acute chest pain or shortness of breath.    Objective:   Vitals:   04/26/22 2039 04/27/22 0635 04/27/22 0812 04/27/22 0841  BP:  128/85  118/87  Pulse:    88  Resp:    20  Temp:      TempSrc:      SpO2: 98%  100% 91%  Weight:      Height:       No intake or output data in the 24 hours ending 04/27/22 1216    Wt Readings from Last 3 Encounters:  04/22/22 84.8 kg  12/31/21 85.3 kg  12/23/21 85.3 kg    Physical Exam General: Alert and oriented x 3, NAD Cardiovascular: Irregularly irregular Respiratory: Bilateral expiratory wheezing Gastrointestinal: Soft, nontender, nondistended, NBS Ext: trace pedal edema bilaterally Neuro: no new deficits Psych: Normal affect     Data Reviewed:  I have personally reviewed following labs    CBC Lab Results  Component Value Date   WBC 15.3 (H) 04/26/2022   RBC 2.93 (L) 04/26/2022   HGB 9.5 (L) 04/26/2022   HCT 28.9 (L) 04/26/2022   MCV 98.6 04/26/2022   MCH 32.4 04/26/2022   PLT 297 04/26/2022   MCHC 32.9 04/26/2022   RDW 12.3 04/26/2022   LYMPHSABS 0.8 04/22/2022   MONOABS 1.4 (H) 04/22/2022   EOSABS 0.0 04/22/2022   BASOSABS 0.0 [id number]     Last metabolic panel Lab Results  Component Value Date   NA 142 04/26/2022   K 4.7 04/26/2022   CL 107 04/26/2022   CO2 29 04/26/2022   BUN 35 (H) 04/26/2022   CREATININE 1.27 (H) 04/26/2022   GLUCOSE 100 (H) 04/26/2022   GFRNONAA  >60 04/26/2022   CALCIUM 8.8 (L) 04/26/2022   PROT 6.9 04/22/2022   ALBUMIN 3.7  04/22/2022   BILITOT 1.0 04/22/2022   ALKPHOS 76 04/22/2022   AST 19 04/22/2022   ALT 17 04/22/2022   ANIONGAP 6 04/26/2022    CBG (last 3)  No results for input(s): "GLUCAP" in the last 72 hours.    Coagulation Profile: No results for input(s): "INR", "PROTIME" in the last 168 hours.   Radiology Studies: I have personally reviewed the imaging studies  CT Angio Chest Pulmonary Embolism (PE) W or WO Contrast  Result Date: 04/26/2022 CLINICAL DATA:  Positive D-dimer, clinical suspicion for pulmonary embolism EXAM: CT ANGIOGRAPHY CHEST WITH CONTRAST TECHNIQUE: Multidetector CT imaging of the chest was performed using the standard protocol during bolus administration of intravenous contrast. Multiplanar CT image reconstructions and MIPs were obtained to evaluate the vascular anatomy. RADIATION DOSE REDUCTION: This exam was performed according to the departmental dose-optimization program which includes automated exposure control, adjustment of the mA and/or kV according to patient size and/or use of iterative reconstruction technique. CONTRAST:  28mL OMNIPAQUE IOHEXOL 350 MG/ML SOLN COMPARISON:  Noncontrast CT done on 04/24/2021, chest radiographs done on 04/22/2022 FINDINGS: Cardiovascular: There are no intraluminal filling defects in pulmonary artery branches. There is homogeneous enhancement in thoracic aorta. Scattered coronary artery calcifications are seen. Heart is enlarged in size. Small pericardial effusion is present. Mediastinum/Nodes: No significant lymphadenopathy is seen. Lungs/Pleura: There are patchy infiltrates in both lower lung fields, more so on the right side suggesting atelectasis/pneumonia. There is minimal right pleural effusion. There is fluid density in the lumen of bronchi in the lower lung fields suggesting secretions in the lumen. There is no pneumothorax. Upper Abdomen: There is nodularity  in the adrenals with no significant interval change. There is 2.3 cm smooth marginated fluid density lesion in the anterior upper pole of left kidney suggesting renal cyst. Musculoskeletal: No acute findings are seen. Review of the MIP images confirms the above findings. IMPRESSION: There is no evidence of pulmonary artery embolism. There is no evidence of thoracic aortic dissection. Coronary artery calcifications are seen. Small pericardial effusion is present. There are patchy infiltrates in both lower lung fields, more so on the right side suggesting atelectasis/pneumonia. There is minimal right pleural effusion. Other findings as described in the body of the report. Electronically Signed   By: Ernie Avena M.D.   On: 04/26/2022 18:21   ECHOCARDIOGRAM COMPLETE  Result Date: 04/26/2022    ECHOCARDIOGRAM REPORT   Patient Name:   Kenneth Weber Date of Exam: 04/26/2022 Medical Rec #:  [id number]      Height:       73.0 in Accession #:    [id number]     Weight:       187.0 lb Date of Birth:  Aug 27, 1950      BSA:          2.091 m Patient Age:    71 years       BP:           117/77 mmHg Patient Gender: M              HR:           105 bpm. Exam Location:  Inpatient Procedure: 2D Echo, Color Doppler and Cardiac Doppler Indications:    I48.91* Unspecified atrial fibrillation  History:        Patient has no prior history of Echocardiogram examinations.                 COPD, Arrythmias:Atrial Fibrillation; Risk Factors:Hypertension  and Dyslipidemia.  Sonographer:    Raquel Sarna Senior RDCS Referring Phys: [id number] ERIC J [place]  Sonographer Comments: Suboptimal parasternals due to lung interference, active URI. IMPRESSIONS  1. EF challenging in the setting of atrial fibrillation . Left ventricular ejection fraction, by estimation, is 50 to 55%. The left ventricle has low normal function. The left ventricle demonstrates global hypokinesis. Indeterminate diastolic filling due to E-A fusion.  2. Right  ventricular systolic function is normal. The right ventricular size is normal. Tricuspid regurgitation signal is inadequate for assessing PA pressure.  3. Left atrial size was mildly dilated.  4. No evidence of mitral valve regurgitation.  5. The aortic valve was not well visualized. Aortic valve regurgitation is not visualized.  6. The inferior vena cava is normal in size with greater than 50% respiratory variability, suggesting right atrial pressure of 3 mmHg. Comparison(s): No prior Echocardiogram. FINDINGS  Left Ventricle: EF challenging in the setting of atrial fibrillation. Left ventricular ejection fraction, by estimation, is 50 to 55%. The left ventricle has low normal function. The left ventricle demonstrates global hypokinesis. The left ventricular internal cavity size was normal in size. There is no left ventricular hypertrophy. Indeterminate diastolic filling due to E-A fusion. Right Ventricle: The right ventricular size is normal. Right ventricular systolic function is normal. Tricuspid regurgitation signal is inadequate for assessing PA pressure. Left Atrium: Left atrial size was mildly dilated. Right Atrium: Right atrial size was normal in size. Pericardium: There is no evidence of pericardial effusion. Mitral Valve: No evidence of mitral valve regurgitation. Tricuspid Valve: Tricuspid valve regurgitation is not demonstrated. Aortic Valve: The aortic valve was not well visualized. Aortic valve regurgitation is not visualized. Pulmonic Valve: Pulmonic valve regurgitation is not visualized. Aorta: The aortic root and ascending aorta are structurally normal, with no evidence of dilitation. Venous: The inferior vena cava is normal in size with greater than 50% respiratory variability, suggesting right atrial pressure of 3 mmHg. IAS/Shunts: No atrial level shunt detected by color flow Doppler.  LEFT VENTRICLE PLAX 2D LVIDd:         4.30 cm LVIDs:         3.30 cm LV PW:         0.90 cm LV IVS:        0.90  cm LVOT diam:     2.20 cm LV SV:         59 LV SV Index:   28 LVOT Area:     3.80 cm  RIGHT VENTRICLE RV S prime:     11.00 cm/s TAPSE (M-mode): 2.5 cm LEFT ATRIUM             Index        RIGHT ATRIUM           Index LA diam:        4.00 cm 1.91 cm/m   RA Area:     22.20 cm LA Vol (A2C):   78.7 ml 37.64 ml/m  RA Volume:   65.00 ml  31.08 ml/m LA Vol (A4C):   63.5 ml 30.37 ml/m LA Biplane Vol: 74.5 ml 35.63 ml/m  AORTIC VALVE LVOT Vmax:   74.40 cm/s LVOT Vmean:  50.600 cm/s LVOT VTI:    0.156 m  AORTA Ao Root diam: 3.30 cm Ao Asc diam:  3.70 cm  SHUNTS Systemic VTI:  0.16 m Systemic Diam: 2.20 cm Phineas Inches Electronically signed by Phineas Inches Signature Date/Time: 04/26/2022/10:07:43 AM    Final  Estill Cotta M.D. [facility] 04/27/2022, 12:16 PM  Available via Epic secure chat 7am-7pm After 7 pm, please refer to night coverage provider listed on amion.

## 2022-04-28 DIAGNOSIS — I4891 Unspecified atrial fibrillation: Secondary | ICD-10-CM

## 2022-04-28 DIAGNOSIS — J9601 Acute respiratory failure with hypoxia: Secondary | ICD-10-CM | POA: Diagnosis not present

## 2022-04-28 DIAGNOSIS — J189 Pneumonia, unspecified organism: Secondary | ICD-10-CM | POA: Diagnosis not present

## 2022-04-28 DIAGNOSIS — K922 Gastrointestinal hemorrhage, unspecified: Secondary | ICD-10-CM

## 2022-04-28 DIAGNOSIS — R600 Localized edema: Secondary | ICD-10-CM | POA: Diagnosis not present

## 2022-04-28 LAB — CULTURE, BLOOD (ROUTINE X 2)
Culture: NO GROWTH
Culture: NO GROWTH
Special Requests: ADEQUATE

## 2022-04-28 LAB — BASIC METABOLIC PANEL
Anion gap: 8 (ref 5–15)
BUN: 28 mg/dL — ABNORMAL HIGH (ref 8–23)
CO2: 29 mmol/L (ref 22–32)
Calcium: 8.8 mg/dL — ABNORMAL LOW (ref 8.9–10.3)
Chloride: 100 mmol/L (ref 98–111)
Creatinine, Ser: 1.32 mg/dL — ABNORMAL HIGH (ref 0.61–1.24)
GFR, Estimated: 58 mL/min — ABNORMAL LOW (ref >60)
Glucose, Bld: 153 mg/dL — ABNORMAL HIGH (ref 70–99)
Potassium: 3.9 mmol/L (ref 3.5–5.1)
Sodium: 137 mmol/L (ref 135–145)

## 2022-04-28 LAB — CBC
HCT: 35.3 % — ABNORMAL LOW (ref 39.0–52.0)
Hemoglobin: 11.9 g/dL — ABNORMAL LOW (ref 13.0–17.0)
MCH: 32.5 pg (ref 26.0–34.0)
MCHC: 33.7 g/dL (ref 30.0–36.0)
MCV: 96.4 fL (ref 80.0–100.0)
Platelets: 379 10*3/uL (ref 150–400)
RBC: 3.66 MIL/uL — ABNORMAL LOW (ref 4.22–5.81)
RDW: 11.9 % (ref 11.5–15.5)
WBC: 13.5 10*3/uL — ABNORMAL HIGH (ref 4.0–10.5)
nRBC: 0 % (ref 0.0–0.2)

## 2022-04-28 MED ORDER — PANTOPRAZOLE SODIUM 40 MG PO TBEC
40.0000 mg | DELAYED_RELEASE_TABLET | Freq: Two times a day (BID) | ORAL | Status: DC
  Administered 2022-04-28 – 2022-04-29 (×2): 40 mg via ORAL
  Filled 2022-04-28: qty 1

## 2022-04-28 NOTE — Consult Note (Signed)
[facility] Consult  Referring Provider: No ref. provider found Primary Care Physician:  [name], MD Primary Gastroenterologist: Dr. Michail Sermon [facility])  Reason for Consultation: Melena  SUBJECTIVE:   HPI: Kenneth Weber is a 71 y.o. male with past medical history significant for COPD (on no supplemental oxygen prior to admission), prior tobacco use, prior COVID-19. Presented to [facility] from Urgent Care  on 04/22/22 with shortness of breath having been diagnosed with RSV 9 days prior. CXR imaging on presentation showed small patchy infiltrate in left lower lung field suggesting interstitial pneumonia. Labs on presentation showed Hgb 11.6, WBC 13.9, PLT 265, AST/ALT 19/17, ALP 76, total bilirubin 1.0. He developed new onset atrial fibrillation with rapid ventricular response on 04/25/22 and was started on Eliquis 5 mg PO BID. CT angiogram pulmonary embolism completed on 04/26/22 given positive D-dimer showed no evidence of pulmonary artery embolism, patchy infiltrates in bilateral lower lung fields.   Since starting Eliquis, he has subsequently experienced never before melena x 2. He denied abdominal pain. No chest pain. No current shortness of breath. He noted having lost weight in the past when he had COVID in 2020. No NSAID use. No prior EtOH use. Eliquis has since been on hold. Hgb stable. Is on 2.5 L supplemental oxygen.   No prior EGD.  Last colonoscopy completed 08/08/2020 (Dr. Michail Sermon) for family history colon cancer (father) - findings significant for 2 mm descending colon flat polyp removed via cold forceps biopsy, few small mouthed diverticula were noted in sigmoid colon, internal hemorrhoids were seen on retroflexion, normal appearing terminal ileum. Pathology from polypectomy returned as hyperplastic polyp. Colonoscopy completed 10/26/2012 Michail Sermon) for family history of colon cancer (father) with findings of small internal hemorrhoids, normal appearing terminal  ileum.  Past Medical History:  Diagnosis Date   Allergic rhinitis    Chest pain    COPD (chronic obstructive pulmonary disease) (HCC)    ED (erectile dysfunction)    Hyperlipidemia    Hypertension    SOB (shortness of breath)    History reviewed. No pertinent surgical history. Prior to Admission medications   Medication Sig Start Date End Date Taking? Authorizing Provider  albuterol (VENTOLIN HFA) 108 (90 Base) MCG/ACT inhaler Up to 2 puffs every 4 hours as needed Patient taking differently: Inhale 1-2 puffs into the lungs every 6 (six) hours as needed for shortness of breath. Up to 2 puffs every 4 hours as needed 12/10/21  Yes Tanda Rockers, MD  amLODipine (NORVASC) 10 MG tablet Take 10 mg by mouth daily. 03/24/22  Yes [provider]  Ascorbic Acid (VITAMIN C) 100 MG tablet Take 100 mg by mouth daily.   Yes [provider]  Budeson-Glycopyrrol-Formoterol (BREZTRI AEROSPHERE) 160-9-4.8 MCG/ACT AERO Take 2 puffs first thing in am and then another 2 puffs about 12 hours later. Patient taking differently: Inhale 2 puffs into the lungs 2 (two) times daily. Take 2 puffs first thing in am and then another 2 puffs about 12 hours later. 01/10/22  Yes Tanda Rockers, MD  doxazosin (CARDURA) 4 MG tablet Take 4 mg by mouth at bedtime. 04/05/22  Yes [provider]  Elderberry 500 MG CAPS Take 1 tablet by mouth daily.   Yes [provider]  latanoprost (XALATAN) 0.005 % ophthalmic solution Place 1 drop into both eyes at bedtime. 09/07/19  Yes [provider]  Omega-3 Fatty Acids (FISH OIL) 1000 MG CAPS Take 2 capsules by mouth daily.   Yes [provider]  timolol (TIMOPTIC) 0.5 % ophthalmic solution Place 1 drop into both eyes every morning. 08/17/19  Yes [provider]  cloNIDine (CATAPRES) 0.1 MG tablet Take 2 tablets (0.2 mg total) by mouth every 12 (twelve) hours as needed. Patient not taking: Reported on 04/23/2022 01/01/22   Veryl Speak, MD   Current Facility-Administered Medications  Medication Dose Route Frequency Provider Last Rate Last Admin   acetaminophen (TYLENOL) tablet 650 mg  650 mg Oral Q6H PRN Opyd, Ilene Qua, MD   650 mg at 04/27/22 [id number]   Or   acetaminophen (TYLENOL) suppository 650 mg  650 mg Rectal Q6H PRN Opyd, Ilene Qua, MD       arformoterol (BROVANA) nebulizer solution 15 mcg  15 mcg Nebulization BID [place] Eric J, DO   15 mcg at 04/28/22 0807   budesonide (PULMICORT) nebulizer solution 0.5 mg  0.5 mg Nebulization BID [place] Donnamarie Poag, DO   0.5 mg at 04/28/22 [id number]   diltiazem (CARDIZEM) tablet 30 mg  30 mg Oral Q6H Rai, Ripudeep K, MD   30 mg at 04/28/22 1240   doxylamine (Sleep) (UNISOM) tablet 25 mg  25 mg Oral QHS PRN Raenette Rover, NP   25 mg at 04/24/22 2243   guaiFENesin (MUCINEX) 12 hr tablet 600 mg  600 mg Oral BID [place] Eric J, DO   600 mg at 04/28/22 0804   levalbuterol (XOPENEX) nebulizer solution 0.63 mg  0.63 mg Nebulization Q6H PRN [place] Eric J, DO   0.63 mg at 04/26/22 1617   melatonin tablet 5 mg  5 mg Oral QHS Rai, Ripudeep K, MD   5 mg at 04/27/22 2113   ondansetron (ZOFRAN) tablet 4 mg  4 mg Oral Q6H PRN Opyd, Ilene Qua, MD       Or   ondansetron (ZOFRAN) injection 4 mg  4 mg Intravenous Q6H PRN Opyd, Ilene Qua, MD       oxyCODONE (Oxy IR/ROXICODONE) immediate release tablet 5 mg  5 mg Oral Q4H PRN Opyd, Ilene Qua, MD       pantoprazole (PROTONIX) EC tablet 40 mg  40 mg Oral BID AC Rai, Ripudeep K, MD       polyethylene glycol (MIRALAX / GLYCOLAX) packet 17 g  17 g Oral BID Rai, Ripudeep K, MD   17 g at 04/28/22 0805   predniSONE (DELTASONE) tablet 40 mg  40 mg Oral QAC breakfast Rai, Ripudeep K, MD   40 mg at 04/28/22 0803   senna-docusate (Senokot-S) tablet 1 tablet  1 tablet Oral BID [place] Eric J, DO   1 tablet at 04/28/22 [id number]   sodium chloride flush (NS) 0.9 % injection 3 mL  3 mL Intravenous Q12H Vianne Bulls, MD   3 mL at 04/28/22 0805   Allergies as of  04/22/2022 - Review Complete 04/22/2022  Allergen Reaction Noted   Other  01/29/2012   Streptogramins  08/30/2013   Codeine Nausea And Vomiting 09/20/2014   Lisinopril Cough 09/20/2014   Losartan potassium-hctz Other (See Comments) 09/20/2014   History reviewed. No pertinent family history. Social History   Socioeconomic History   Marital status: Married    Spouse name: Not on file   Number of children: Not on file   Years of education: Not on file   Highest education level: Not on file  Occupational History   Not on file  Tobacco Use   Smoking status: Former    Packs/day: 1.50    Years: 40.00    Total  pack years: 60.00    Types: Cigarettes    Quit date: 09/13/2013    Years since quitting: 8.6   Smokeless tobacco: Never   Tobacco comments:    Patient reports cigars at times but trying to quit, 12/01/2020.   Vaping Use   Vaping Use: Never used  Substance and Sexual Activity   Alcohol use: No   Drug use: No   Sexual activity: Not on file  Other Topics Concern   Not on file  Social History Narrative   Not on file   Social Determinants of Health   Financial Resource Strain: Not on file  Food Insecurity: Not on file  Transportation Needs: Not on file  Physical Activity: Not on file  Stress: Not on file  Social Connections: Not on file  Intimate Partner Violence: Not on file   Review of Systems:  Review of Systems  Respiratory:  Negative for shortness of breath.   Cardiovascular:  Negative for chest pain.  Gastrointestinal:  Positive for blood in stool and melena. Negative for abdominal pain, constipation, diarrhea and vomiting.    OBJECTIVE:   Temp:  [97.5 F (36.4 C)-97.6 F (36.4 C)] 97.6 F (36.4 C) (12/24 0503) Pulse Rate:  [85-104] 104 (12/24 1411) Resp:  [20-31] 31 (12/24 1411) BP: (95-127)/(58-74) 101/58 (12/24 1411) SpO2:  [90 %-99 %] 90 % (12/24 1411) FiO2 (%):  [30 %] 30 % (12/23 2038) Weight:  [82.3 kg] 82.3 kg (12/24 0500) Last BM Date :  04/27/22 Physical Exam Constitutional:      General: He is not in acute distress.    Appearance: He is not ill-appearing, toxic-appearing or diaphoretic.  Cardiovascular:     Rate and Rhythm: Tachycardia present. Rhythm irregular.  Pulmonary:     Effort: Respiratory distress (mild, some conversational dyspnea) present.     Comments: Supplemental oxygen nasal cannula 2.5 liters. Abdominal:     General: Bowel sounds are normal. There is no distension.     Palpations: Abdomen is soft.     Tenderness: There is no abdominal tenderness. There is no guarding.  Musculoskeletal:     Right lower leg: No edema.     Left lower leg: No edema.  Skin:    General: Skin is warm and dry.  Neurological:     Mental Status: He is alert.     Labs: Recent Labs    04/26/22 0305 04/27/22 1206 04/28/22 1023  WBC 15.3*  --  13.5*  HGB 9.5* 11.3* 11.9*  HCT 28.9* 35.3* 35.3*  PLT 297  --  379   BMET Recent Labs    04/26/22 0305 04/28/22 1023  NA 142 137  K 4.7 3.9  CL 107 100  CO2 29 29  GLUCOSE 100* 153*  BUN 35* 28*  CREATININE 1.27* 1.32*  CALCIUM 8.8* 8.8*   LFT No results for input(s): "PROT", "ALBUMIN", "AST", "ALT", "ALKPHOS", "BILITOT", "BILIDIR", "IBILI" in the last 72 hours. PT/INR No results for input(s): "LABPROT", "INR" in the last 72 hours.  Diagnostic imaging: CT Angio Chest Pulmonary Embolism (PE) W or WO Contrast  Result Date: 04/26/2022 CLINICAL DATA:  Positive D-dimer, clinical suspicion for pulmonary embolism EXAM: CT ANGIOGRAPHY CHEST WITH CONTRAST TECHNIQUE: Multidetector CT imaging of the chest was performed using the standard protocol during bolus administration of intravenous contrast. Multiplanar CT image reconstructions and MIPs were obtained to evaluate the vascular anatomy. RADIATION DOSE REDUCTION: This exam was performed according to the departmental dose-optimization program which includes automated exposure control, adjustment of  the mA and/or kV  according to patient size and/or use of iterative reconstruction technique. CONTRAST:  28mL OMNIPAQUE IOHEXOL 350 MG/ML SOLN COMPARISON:  Noncontrast CT done on 04/24/2021, chest radiographs done on 04/22/2022 FINDINGS: Cardiovascular: There are no intraluminal filling defects in pulmonary artery branches. There is homogeneous enhancement in thoracic aorta. Scattered coronary artery calcifications are seen. Heart is enlarged in size. Small pericardial effusion is present. Mediastinum/Nodes: No significant lymphadenopathy is seen. Lungs/Pleura: There are patchy infiltrates in both lower lung fields, more so on the right side suggesting atelectasis/pneumonia. There is minimal right pleural effusion. There is fluid density in the lumen of bronchi in the lower lung fields suggesting secretions in the lumen. There is no pneumothorax. Upper Abdomen: There is nodularity in the adrenals with no significant interval change. There is 2.3 cm smooth marginated fluid density lesion in the anterior upper pole of left kidney suggesting renal cyst. Musculoskeletal: No acute findings are seen. Review of the MIP images confirms the above findings. IMPRESSION: There is no evidence of pulmonary artery embolism. There is no evidence of thoracic aortic dissection. Coronary artery calcifications are seen. Small pericardial effusion is present. There are patchy infiltrates in both lower lung fields, more so on the right side suggesting atelectasis/pneumonia. There is minimal right pleural effusion. Other findings as described in the body of the report. Electronically Signed   By: Elmer Picker M.D.   On: 04/26/2022 18:21    IMPRESSION: Melena, new  -On pantoprazole 40 mg PO BID Acute hypoxemic respiratory failure -Interstitial pneumonia on CT imaging, not on antibiotics currently -History COPD (on Prednisone 40 mg PO daily), history tobacco use, current RSV  -No prior to admission supplemental oxygen therapy, currently on 2.5  L nasal cannula Atrial fibrillation with rapid ventricular response, new this admission  -Started on Eliquis 04/25/22 and subsequently developed melena, now on hold  -Tachycardic on exam today into 110+ rate Normocytic anemia Leukocytosis, suspect iatrogenic secondary to steroids Family history colon cancer, father  PLAN: -Recommend EGD to further investigate melena/anemia, discussed benefits/alternatives and risks of procedure (including bleeding, infection, perforation, anesthesia complications) with patient and his wife at bedside, in agreement to proceed -Change from oral to IV PPI Q12Hr -Plan for EGD 04/30/22 pending further respiratory and cardiac optimization, Hgb stable -Ok for diet today and 04/29/22, NPO at midnight 04/29/22 for procedure 04/30/22 -Hold Eliquis until procedure -Wean supplemental oxygen as tolerated  -Trend H/H, transfuse for Hgb < 7 -Your IM management -[facility] will follow   LOS: 5 days   Danton Clap, DO [facility]

## 2022-04-28 NOTE — Progress Notes (Signed)
[facility]                                                                              Kenneth Weber, is a 71 y.o. male, DOB - 14-Apr-1951, [id number] Admit date - 04/22/2022    Outpatient Primary MD for the patient is [name], MD  LOS - 5  days  Chief Complaint  Patient presents with   Shortness of Breath       Brief summary   Patient is a 71 year old male with HTN, COPD presented from urgent care with progressive shortness of breath.  He was recently diagnosed with RSV on 12/8.  Subsequently developed productive cough and returned to urgent care on 12/18, found to be hypoxic and directed to ED for further evaluation. In ED temp 99.4 F, HR 99, RR 26, O2 sats 84% on room air, WBCs 13.9, creatinine 1.2.  Lactic acid 0.8, procalcitonin 0.24.  COVID-19 PCR negative, flu A&B negative, RSV positive Chest x-ray with small patchy infiltrates left lower lung suggestive of interstitial pneumonia.   Assessment & Plan    Principal Problem:   Acute respiratory failure with hypoxia ([place]), RSV positive PNA -Presented with progressive shortness of breath, hypoxic in ED, O2 sats 84%, not on O2 at baseline -Home O2 evaluation prior to discharge.  Not on home O2 PTA -CTA negative for acute PE, venous Dopplers negative for DVT -Improving, O2 sats 96% on 2.5 L, wean O2 as tolerated  Active Problems: Community-acquired pneumonia COPD exacerbation -Completed IV Rocephin  -Wheezing improving, continue Pulmicort, Brovana, DuoNebs, transition to oral prednisone on 12/23   Atrial fibrillation with rapid ventricular response ([place]), acute diastolic CHF -BNP [id number], likely triggered by COPD, RSV pneumonia -2D echo showed EF of 50 to 55%, global hypokinesis, normal RV SF  -TSH low 0.15, free T4 normal, repeat studies in 4 weeks -Patient was started on short acting Cardizem, beta-blocker, Lasix x 1 on 12/23 -CHA2DS2-VASc 2 for age, hypertension, recommended AC -Apixaban  currently on hold, had black stool on 12/23, FOBT positive -Cardiology following.  Metoprolol stopped, continue diltiazem 30 mg every 6 hours for rate control  Constipation, then melena/GI bleed -Per patient, constipated for last 6 days, received MiraLAX, Senokot, Dulcolax on 12/23.  Subsequently had a BM which was melanotic.  H&H stable, however FOBT positive. -Eliquis held, GI consulted. -Patient remembered that he had a prior colonoscopy with [facility], d/w Dr Randel Pigg, recommended high-dose PPI, plan on EGD on 12/26    Hypertension -BP stable    Bilateral lower extremity edema Could be side effect from amlodipine, venous Dopplers negative for DVT  Tobacco use disorder -Smoking cessation counseling done   Code Status: Full CODE STATUS DVT Prophylaxis:     Level of Care: Level of care: Telemetry Medical Family Communication: Updated patient's wife on the phone today Disposition Plan:      Remains inpatient appropriate: Pending EGD on 12/26   Procedures:  2D echo   Consultants:   Cardiology Gastroenterology  Antimicrobials:   Anti-infectives (From admission, onward)    Start     Dose/Rate Route Frequency Ordered Stop   04/23/22 2200  cefTRIAXone (  ROCEPHIN) 2 g in sodium chloride 0.9 % 100 mL IVPB        2 g 200 mL/hr over 30 Minutes Intravenous Every 24 hours 04/23/22 0205 04/26/22 2238   04/23/22 1800  azithromycin (ZITHROMAX) tablet 500 mg        500 mg Oral Daily 04/23/22 0205 04/26/22 0849   04/22/22 2330  cefTRIAXone (ROCEPHIN) 2 g in sodium chloride 0.9 % 100 mL IVPB  Status:  Discontinued        2 g 200 mL/hr over 30 Minutes Intravenous Every 24 hours 04/22/22 2325 04/23/22 0206   04/22/22 2330  azithromycin (ZITHROMAX) 500 mg in sodium chloride 0.9 % 250 mL IVPB  Status:  Discontinued        500 mg 250 mL/hr over 60 Minutes Intravenous Every 24 hours 04/22/22 2325 04/23/22 0207          Medications  arformoterol  15 mcg Nebulization BID   budesonide  (PULMICORT) nebulizer solution  0.5 mg Nebulization BID   diltiazem  30 mg Oral Q6H   guaiFENesin  600 mg Oral BID   melatonin  5 mg Oral QHS   pantoprazole  40 mg Oral BID AC   polyethylene glycol  17 g Oral BID   predniSONE  40 mg Oral QAC breakfast   senna-docusate  1 tablet Oral BID   sodium chloride flush  3 mL Intravenous Q12H      Subjective:   Kenneth Weber was seen and examined today.  Yesterday had 1 dark tarry stool.  Heart rate controlled.  Shortness of breath and wheezing is improving.  No acute chest pain.    Objective:   Vitals:   04/28/22 0500 04/28/22 0503 04/28/22 0504 04/28/22 1240  BP:  95/66  96/66  Pulse:  85    Resp:  (!) 22 20   Temp:  97.6 F (36.4 C)    TempSrc:  Oral    SpO2:  96%    Weight: 82.3 kg     Height:        Intake/Output Summary (Last 24 hours) at 04/28/2022 1343 Last data filed at 04/28/2022 0500 Gross per 24 hour  Intake --  Output 900 ml  Net -900 ml      Wt Readings from Last 3 Encounters:  04/28/22 82.3 kg  12/31/21 85.3 kg  12/23/21 85.3 kg   Physical Exam General: Alert and oriented x 3, NAD Cardiovascular: S1 S2 clear, RRR.  Respiratory: Bilateral expiratory wheezing with rhonchi Gastrointestinal: Soft, nontender, nondistended, NBS Ext: no pedal edema bilaterally Neuro: no new deficits Psych: Normal affect     Data Reviewed:  I have personally reviewed following labs    CBC Lab Results  Component Value Date   WBC 13.5 (H) 04/28/2022   RBC 3.66 (L) 04/28/2022   HGB 11.9 (L) 04/28/2022   HCT 35.3 (L) 04/28/2022   MCV 96.4 04/28/2022   MCH 32.5 04/28/2022   PLT 379 04/28/2022   MCHC 33.7 04/28/2022   RDW 11.9 04/28/2022   LYMPHSABS 0.8 04/22/2022   MONOABS 1.4 (H) 04/22/2022   EOSABS 0.0 04/22/2022   BASOSABS 0.0 04/22/2022     Last metabolic panel Lab Results  Component Value Date   NA 137 04/28/2022   K 3.9 04/28/2022   CL 100 04/28/2022   CO2 29 04/28/2022   BUN 28 (H) 04/28/2022    CREATININE 1.32 (H) 04/28/2022   GLUCOSE 153 (H) 04/28/2022   GFRNONAA 58 (L) 04/28/2022   CALCIUM  8.8 (L) 04/28/2022   PROT 6.9 04/22/2022   ALBUMIN 3.7 04/22/2022   BILITOT 1.0 04/22/2022   ALKPHOS 76 04/22/2022   AST 19 04/22/2022   ALT 17 04/22/2022   ANIONGAP 8 04/28/2022    CBG (last 3)  No results for input(s): "GLUCAP" in the last 72 hours.    Coagulation Profile: No results for input(s): "INR", "PROTIME" in the last 168 hours.   Radiology Studies: I have personally reviewed the imaging studies  CT Angio Chest Pulmonary Embolism (PE) W or WO Contrast  Result Date: 04/26/2022 CLINICAL DATA:  Positive D-dimer, clinical suspicion for pulmonary embolism EXAM: CT ANGIOGRAPHY CHEST WITH CONTRAST TECHNIQUE: Multidetector CT imaging of the chest was performed using the standard protocol during bolus administration of intravenous contrast. Multiplanar CT image reconstructions and MIPs were obtained to evaluate the vascular anatomy. RADIATION DOSE REDUCTION: This exam was performed according to the departmental dose-optimization program which includes automated exposure control, adjustment of the mA and/or kV according to patient size and/or use of iterative reconstruction technique. CONTRAST:  61mL OMNIPAQUE IOHEXOL 350 MG/ML SOLN COMPARISON:  Noncontrast CT done on 04/24/2021, chest radiographs done on 04/22/2022 FINDINGS: Cardiovascular: There are no intraluminal filling defects in pulmonary artery branches. There is homogeneous enhancement in thoracic aorta. Scattered coronary artery calcifications are seen. Heart is enlarged in size. Small pericardial effusion is present. Mediastinum/Nodes: No significant lymphadenopathy is seen. Lungs/Pleura: There are patchy infiltrates in both lower lung fields, more so on the right side suggesting atelectasis/pneumonia. There is minimal right pleural effusion. There is fluid density in the lumen of bronchi in the lower lung fields suggesting  secretions in the lumen. There is no pneumothorax. Upper Abdomen: There is nodularity in the adrenals with no significant interval change. There is 2.3 cm smooth marginated fluid density lesion in the anterior upper pole of left kidney suggesting renal cyst. Musculoskeletal: No acute findings are seen. Review of the MIP images confirms the above findings. IMPRESSION: There is no evidence of pulmonary artery embolism. There is no evidence of thoracic aortic dissection. Coronary artery calcifications are seen. Small pericardial effusion is present. There are patchy infiltrates in both lower lung fields, more so on the right side suggesting atelectasis/pneumonia. There is minimal right pleural effusion. Other findings as described in the body of the report. Electronically Signed   By: Elmer Picker M.D.   On: 04/26/2022 18:21       Arrion Burruel M.D. [facility] 04/28/2022, 1:43 PM  Available via Epic secure chat 7am-7pm After 7 pm, please refer to night coverage provider listed on amion.

## 2022-04-28 NOTE — Consult Note (Signed)
Cardiology Consultation:  Patient ID: Kenneth Weber MRN: [id number]; DOB: 09-26-1950  Admit date: 04/22/2022 Date of Consult: 04/28/2022  Primary Care Provider: [name], MD Primary Cardiologist: None  Primary Electrophysiologist:  None   Patient Profile:  Kenneth Weber is a 71 y.o. male with a hx of COPD, HTN, HLD who is being seen today for the evaluation of atrial fibrillation at the request of Ripudeep Rai, MD.  History of Present Illness:  Mr. Trundle was admitted to the hospital on 04/23/2022 for acute hypoxic respiratory failure secondary to pneumonia.  Also suffered a COPD exacerbation. RSV+.  Hospital course complicated by new onset atrial fibrillation.  Heart rate has been controlled with diltiazem and metoprolol.  He was started on Eliquis since admission.  Apparently he had black stools and Eliquis is on hold.  Fecal occult blood testing is positive.  Hemoglobin is stable.  Cardiology consulted for new onset atrial fibrillation.  Per review of telemetry A-fib heart rates are between 90 to 110 bpm.  Currently on diltiazem 30 mg every 6 hours as well as metoprolol 25 mg twice daily.  Eliquis is being held due to positive fecal occult blood testing.  He reports colonoscopy last year that was unremarkable.  Has had dark stools since Eliquis was started this admission but it is currently being held.  He is euvolemic on examination.  Lungs with significant rhonchi and wheezing.  He reports his breathing is improved.  He reports no history of CAD, congestive heart failure or arrhythmia in the past.  He reports his thyroid has not been an issue.  He denies chest pain or trouble breathing at time examination.  Laboratory data notable for serum creatinine 1.32.  BNP value was slightly elevated at 279.  Hemoglobin 11.9.  Platelets 379.  TSH 0.158.  Low T3.  Normal free T4.  Hospital medicine has recommended follow-up labs in 4 to 6 weeks.  CT PE study negative.  Heart Pathway Score:        Past Medical History: Past Medical History:  Diagnosis Date   Allergic rhinitis    Chest pain    COPD (chronic obstructive pulmonary disease) (HCC)    ED (erectile dysfunction)    Hyperlipidemia    Hypertension    SOB (shortness of breath)     Past Surgical History: History reviewed. No pertinent surgical history.   Home Medications:  Prior to Admission medications   Medication Sig Start Date End Date Taking? Authorizing Provider  albuterol (VENTOLIN HFA) 108 (90 Base) MCG/ACT inhaler Up to 2 puffs every 4 hours as needed Patient taking differently: Inhale 1-2 puffs into the lungs every 6 (six) hours as needed for shortness of breath. Up to 2 puffs every 4 hours as needed 12/10/21  Yes Tanda Rockers, MD  amLODipine (NORVASC) 10 MG tablet Take 10 mg by mouth daily. 03/24/22  Yes [provider]  Ascorbic Acid (VITAMIN C) 100 MG tablet Take 100 mg by mouth daily.   Yes [provider]  Budeson-Glycopyrrol-Formoterol (BREZTRI AEROSPHERE) 160-9-4.8 MCG/ACT AERO Take 2 puffs first thing in am and then another 2 puffs about 12 hours later. Patient taking differently: Inhale 2 puffs into the lungs 2 (two) times daily. Take 2 puffs first thing in am and then another 2 puffs about 12 hours later. 01/10/22  Yes Tanda Rockers, MD  doxazosin (CARDURA) 4 MG tablet Take 4 mg by mouth at bedtime. 04/05/22  Yes [provider]  Elderberry 500 MG CAPS Take 1  tablet by mouth daily.   Yes [provider]  latanoprost (XALATAN) 0.005 % ophthalmic solution Place 1 drop into both eyes at bedtime. 09/07/19  Yes [provider]  Omega-3 Fatty Acids (FISH OIL) 1000 MG CAPS Take 2 capsules by mouth daily.   Yes [provider]  timolol (TIMOPTIC) 0.5 % ophthalmic solution Place 1 drop into both eyes every morning. 08/17/19  Yes [provider]  cloNIDine (CATAPRES) 0.1 MG tablet Take 2 tablets (0.2 mg total) by mouth every 12 (twelve) hours as  needed. Patient not taking: Reported on 04/23/2022 01/01/22   Veryl Speak, MD    Inpatient Medications: Scheduled Meds:  arformoterol  15 mcg Nebulization BID   budesonide (PULMICORT) nebulizer solution  0.5 mg Nebulization BID   diltiazem  30 mg Oral Q6H   guaiFENesin  600 mg Oral BID   melatonin  5 mg Oral QHS   metoprolol tartrate  25 mg Oral BID   polyethylene glycol  17 g Oral BID   predniSONE  40 mg Oral QAC breakfast   senna-docusate  1 tablet Oral BID   sodium chloride flush  3 mL Intravenous Q12H   Continuous Infusions:  PRN Meds: acetaminophen **OR** acetaminophen, doxylamine (Sleep), hydrALAZINE, levalbuterol, ondansetron **OR** ondansetron (ZOFRAN) IV, oxyCODONE  Allergies:    Allergies  Allergen Reactions   Other    Streptogramins    Codeine Nausea And Vomiting   Lisinopril Cough   Losartan Potassium-Hctz Other (See Comments)    Erectile Dysfunction    Social History:   Social History   Socioeconomic History   Marital status: Married    Spouse name: Not on file   Number of children: Not on file   Years of education: Not on file   Highest education level: Not on file  Occupational History   Not on file  Tobacco Use   Smoking status: Former    Packs/day: 1.50    Years: 40.00    Total pack years: 60.00    Types: Cigarettes    Quit date: 09/13/2013    Years since quitting: 8.6   Smokeless tobacco: Never   Tobacco comments:    Patient reports cigars at times but trying to quit, 12/01/2020.   Vaping Use   Vaping Use: Never used  Substance and Sexual Activity   Alcohol use: No   Drug use: No   Sexual activity: Not on file  Other Topics Concern   Not on file  Social History Narrative   Not on file   Social Determinants of Health   Financial Resource Strain: Not on file  Food Insecurity: Not on file  Transportation Needs: Not on file  Physical Activity: Not on file  Stress: Not on file  Social Connections: Not on file  Intimate Partner  Violence: Not on file     Family History:   History reviewed. No pertinent family history.   ROS:  All other ROS reviewed and negative. Pertinent positives noted in the HPI.     Physical Exam/Data:   Vitals:   04/27/22 2038 04/28/22 0500 04/28/22 0503 04/28/22 0504  BP:   95/66   Pulse:   85   Resp:   (!) 22 20  Temp:   97.6 F (36.4 C)   TempSrc:   Oral   SpO2: 99%  96%   Weight:  82.3 kg    Height:        Intake/Output Summary (Last 24 hours) at 04/28/2022 1213 Last data filed at  04/28/2022 0500 Gross per 24 hour  Intake --  Output 900 ml  Net -900 ml       04/28/2022    5:00 AM 04/22/2022    4:16 PM 12/31/2021   11:57 PM  Last 3 Weights  Weight (lbs) 181 lb 7 oz 187 lb 188 lb  Weight (kg) 82.3 kg 84.823 kg 85.276 kg    Body mass index is 23.94 kg/m.  General: Well nourished, well developed, in no acute distress Head: Atraumatic, normal size  Eyes: PEERLA, EOMI  Neck: Supple, no JVD Endocrine: No thryomegaly Cardiac: Normal S1, S2; irregular rhythm, no murmurs Lungs: Diffuse rhonchi and wheezing noted Abd: Soft, nontender, no hepatomegaly  Ext: No edema, pulses 2+ Musculoskeletal: No deformities, BUE and BLE strength normal and equal Skin: Warm and dry, no rashes   Neuro: Alert and oriented to person, place, time, and situation, CNII-XII grossly intact, no focal deficits  Psych: Normal mood and affect   EKG:  The EKG was personally reviewed and demonstrates: A-fib with RVR, no acute ischemic changes Telemetry:  Telemetry was personally reviewed and demonstrates: A-fib heart rate 90-110 bpm  Relevant CV Studies: TTE 04/26/2022  1. EF challenging in the setting of atrial fibrillation . Left  ventricular ejection fraction, by estimation, is 50 to 55%. The left  ventricle has low normal function. The left ventricle demonstrates global  hypokinesis. Indeterminate diastolic filling  due to E-A fusion.   2. Right ventricular systolic function is normal. The  right ventricular  size is normal. Tricuspid regurgitation signal is inadequate for assessing  PA pressure.   3. Left atrial size was mildly dilated.   4. No evidence of mitral valve regurgitation.   5. The aortic valve was not well visualized. Aortic valve regurgitation  is not visualized.   6. The inferior vena cava is normal in size with greater than 50%  respiratory variability, suggesting right atrial pressure of 3 mmHg.   Laboratory Data: High Sensitivity Troponin:  No results for input(s): "TROPONINIHS" in the last 720 hours.   Cardiac EnzymesNo results for input(s): "TROPONINI" in the last 168 hours. No results for input(s): "TROPIPOC" in the last 168 hours.  Chemistry Recent Labs  Lab 04/25/22 0412 04/26/22 0305 04/28/22 1023  NA 142 142 137  K 3.8 4.7 3.9  CL 106 107 100  CO2 28 29 29   GLUCOSE 107* 100* 153*  BUN 46* 35* 28*  CREATININE 1.22 1.27* 1.32*  CALCIUM 8.6* 8.8* 8.8*  GFRNONAA >60 >60 58*  ANIONGAP 8 6 8     Recent Labs  Lab 04/22/22 1607  PROT 6.9  ALBUMIN 3.7  AST 19  ALT 17  ALKPHOS 76  BILITOT 1.0   Hematology Recent Labs  Lab 04/25/22 0412 04/26/22 0305 04/27/22 1206 04/28/22 1023  WBC 18.5* 15.3*  --  13.5*  RBC 2.89* 2.93*  --  3.66*  HGB 9.5* 9.5* 11.3* 11.9*  HCT 28.0* 28.9* 35.3* 35.3*  MCV 96.9 98.6  --  96.4  MCH 32.9 32.4  --  32.5  MCHC 33.9 32.9  --  33.7  RDW 12.1 12.3  --  11.9  PLT 284 297  --  379   BNP Recent Labs  Lab 04/22/22 2345 04/26/22 1017  BNP 36.6 279.8*    DDimer  Recent Labs  Lab 04/23/22 1530 04/26/22 1017  DDIMER 0.97* 0.81*    Radiology/Studies:  CT Angio Chest Pulmonary Embolism (PE) W or WO Contrast  Result Date: 04/26/2022 CLINICAL DATA:  Positive D-dimer, clinical suspicion for pulmonary embolism EXAM: CT ANGIOGRAPHY CHEST WITH CONTRAST TECHNIQUE: Multidetector CT imaging of the chest was performed using the standard protocol during bolus administration of intravenous contrast.  Multiplanar CT image reconstructions and MIPs were obtained to evaluate the vascular anatomy. RADIATION DOSE REDUCTION: This exam was performed according to the departmental dose-optimization program which includes automated exposure control, adjustment of the mA and/or kV according to patient size and/or use of iterative reconstruction technique. CONTRAST:  6mL OMNIPAQUE IOHEXOL 350 MG/ML SOLN COMPARISON:  Noncontrast CT done on 04/24/2021, chest radiographs done on 04/22/2022 FINDINGS: Cardiovascular: There are no intraluminal filling defects in pulmonary artery branches. There is homogeneous enhancement in thoracic aorta. Scattered coronary artery calcifications are seen. Heart is enlarged in size. Small pericardial effusion is present. Mediastinum/Nodes: No significant lymphadenopathy is seen. Lungs/Pleura: There are patchy infiltrates in both lower lung fields, more so on the right side suggesting atelectasis/pneumonia. There is minimal right pleural effusion. There is fluid density in the lumen of bronchi in the lower lung fields suggesting secretions in the lumen. There is no pneumothorax. Upper Abdomen: There is nodularity in the adrenals with no significant interval change. There is 2.3 cm smooth marginated fluid density lesion in the anterior upper pole of left kidney suggesting renal cyst. Musculoskeletal: No acute findings are seen. Review of the MIP images confirms the above findings. IMPRESSION: There is no evidence of pulmonary artery embolism. There is no evidence of thoracic aortic dissection. Coronary artery calcifications are seen. Small pericardial effusion is present. There are patchy infiltrates in both lower lung fields, more so on the right side suggesting atelectasis/pneumonia. There is minimal right pleural effusion. Other findings as described in the body of the report. Electronically Signed   By: Elmer Picker M.D.   On: 04/26/2022 18:21   ECHOCARDIOGRAM COMPLETE  Result Date:  04/26/2022    ECHOCARDIOGRAM REPORT   Patient Name:   Kenneth Weber Date of Exam: 04/26/2022 Medical Rec #:  [id number]      Height:       73.0 in Accession #:    [id number]     Weight:       187.0 lb Date of Birth:  22-May-1950      BSA:          2.091 m Patient Age:    74 years       BP:           117/77 mmHg Patient Gender: M              HR:           105 bpm. Exam Location:  Inpatient Procedure: 2D Echo, Color Doppler and Cardiac Doppler Indications:    I48.91* Unspecified atrial fibrillation  History:        Patient has no prior history of Echocardiogram examinations.                 COPD, Arrythmias:Atrial Fibrillation; Risk Factors:Hypertension                 and Dyslipidemia.  Sonographer:    Raquel Sarna Senior RDCS Referring Phys: [id number] ERIC J [place]  Sonographer Comments: Suboptimal parasternals due to lung interference, active URI. IMPRESSIONS  1. EF challenging in the setting of atrial fibrillation . Left ventricular ejection fraction, by estimation, is 50 to 55%. The left ventricle has low normal function. The left ventricle demonstrates global hypokinesis. Indeterminate diastolic filling due to E-A fusion.  2. Right ventricular systolic function  is normal. The right ventricular size is normal. Tricuspid regurgitation signal is inadequate for assessing PA pressure.  3. Left atrial size was mildly dilated.  4. No evidence of mitral valve regurgitation.  5. The aortic valve was not well visualized. Aortic valve regurgitation is not visualized.  6. The inferior vena cava is normal in size with greater than 50% respiratory variability, suggesting right atrial pressure of 3 mmHg. Comparison(s): No prior Echocardiogram. FINDINGS  Left Ventricle: EF challenging in the setting of atrial fibrillation. Left ventricular ejection fraction, by estimation, is 50 to 55%. The left ventricle has low normal function. The left ventricle demonstrates global hypokinesis. The left ventricular internal cavity size was normal  in size. There is no left ventricular hypertrophy. Indeterminate diastolic filling due to E-A fusion. Right Ventricle: The right ventricular size is normal. Right ventricular systolic function is normal. Tricuspid regurgitation signal is inadequate for assessing PA pressure. Left Atrium: Left atrial size was mildly dilated. Right Atrium: Right atrial size was normal in size. Pericardium: There is no evidence of pericardial effusion. Mitral Valve: No evidence of mitral valve regurgitation. Tricuspid Valve: Tricuspid valve regurgitation is not demonstrated. Aortic Valve: The aortic valve was not well visualized. Aortic valve regurgitation is not visualized. Pulmonic Valve: Pulmonic valve regurgitation is not visualized. Aorta: The aortic root and ascending aorta are structurally normal, with no evidence of dilitation. Venous: The inferior vena cava is normal in size with greater than 50% respiratory variability, suggesting right atrial pressure of 3 mmHg. IAS/Shunts: No atrial level shunt detected by color flow Doppler.  LEFT VENTRICLE PLAX 2D LVIDd:         4.30 cm LVIDs:         3.30 cm LV PW:         0.90 cm LV IVS:        0.90 cm LVOT diam:     2.20 cm LV SV:         59 LV SV Index:   28 LVOT Area:     3.80 cm  RIGHT VENTRICLE RV S prime:     11.00 cm/s TAPSE (M-mode): 2.5 cm LEFT ATRIUM             Index        RIGHT ATRIUM           Index LA diam:        4.00 cm 1.91 cm/m   RA Area:     22.20 cm LA Vol (A2C):   78.7 ml 37.64 ml/m  RA Volume:   65.00 ml  31.08 ml/m LA Vol (A4C):   63.5 ml 30.37 ml/m LA Biplane Vol: 74.5 ml 35.63 ml/m  AORTIC VALVE LVOT Vmax:   74.40 cm/s LVOT Vmean:  50.600 cm/s LVOT VTI:    0.156 m  AORTA Ao Root diam: 3.30 cm Ao Asc diam:  3.70 cm  SHUNTS Systemic VTI:  0.16 m Systemic Diam: 2.20 cm Phineas Inches Electronically signed by Phineas Inches Signature Date/Time: 04/26/2022/10:07:43 AM    Final     Assessment and Plan:   # New onset atrial fibrillation -Triggered by COPD  exacerbation and RSV pneumonia.  No signs of congestive heart failure.  His BNP was minimally elevated and likely related to A-fib. -Chest CT shows no evidence of pulmonary embolism.  He has a trace right pleural effusion which is likely parapneumonic on my review. -Low TSH but normal T4.  Hospital medicine does not believe this needs treatment. -Echo shows normal  LV function. -Per nursing we have had issues with blood pressure.  He is well-perfused and with no signs of shock. -Would recommend for lenient rate control strategy.  We will stop metoprolol.  Continue diltiazem 30 mg every 6 hours.  Consolidate to extended release dosing in the next 24 hours.  If blood pressure continues to be an issue would recommend digoxin.  Kidney function has been stable. -No signs of volume overload.  No need for diuresis. -Eliquis is indicated.  His CHA2DS2-VASc score is 2 (age and hypertension).  However, he is having dark tarry stools.  Fecal occult blood testing is positive.  Would recommend to hold Eliquis until it is deemed safe.  Suspect he will need GI evaluation. -Ultimately would give him time to return to sinus rhythm on his own.  Treatment of his lung condition may improve his A-fib.  Otherwise, we will plan for an outpatient cardioversion once it is safe to be on anticoagulation.  Currently it does not appear to be safe.  This can be determined at a later date.  # RSV pneumonia # COPD exacerbation -Per hospital medicine  For questions or updates, please contact [place] Please consult [email or web address] for contact info under   Signed, [place] T. Audie Box, MD, [place]  04/28/2022 12:13 PM

## 2022-04-28 NOTE — Plan of Care (Signed)
  Problem: Respiratory: Goal: Ability to maintain a clear airway will improve Outcome: Not Progressing Goal: Levels of oxygenation will improve Outcome: Not Progressing Goal: Ability to maintain adequate ventilation will improve Outcome: Not Progressing

## 2022-04-29 DIAGNOSIS — R600 Localized edema: Secondary | ICD-10-CM | POA: Diagnosis not present

## 2022-04-29 DIAGNOSIS — I480 Paroxysmal atrial fibrillation: Secondary | ICD-10-CM | POA: Diagnosis not present

## 2022-04-29 DIAGNOSIS — J189 Pneumonia, unspecified organism: Secondary | ICD-10-CM | POA: Diagnosis not present

## 2022-04-29 DIAGNOSIS — J9601 Acute respiratory failure with hypoxia: Secondary | ICD-10-CM | POA: Diagnosis not present

## 2022-04-29 DIAGNOSIS — K921 Melena: Secondary | ICD-10-CM

## 2022-04-29 DIAGNOSIS — I4891 Unspecified atrial fibrillation: Secondary | ICD-10-CM | POA: Diagnosis not present

## 2022-04-29 LAB — BASIC METABOLIC PANEL
Anion gap: 10 (ref 5–15)
BUN: 30 mg/dL — ABNORMAL HIGH (ref 8–23)
CO2: 28 mmol/L (ref 22–32)
Calcium: 8.7 mg/dL — ABNORMAL LOW (ref 8.9–10.3)
Chloride: 100 mmol/L (ref 98–111)
Creatinine, Ser: 1.42 mg/dL — ABNORMAL HIGH (ref 0.61–1.24)
GFR, Estimated: 53 mL/min — ABNORMAL LOW (ref >60)
Glucose, Bld: 103 mg/dL — ABNORMAL HIGH (ref 70–99)
Potassium: 4.6 mmol/L (ref 3.5–5.1)
Sodium: 138 mmol/L (ref 135–145)

## 2022-04-29 LAB — CBC
HCT: 33 % — ABNORMAL LOW (ref 39.0–52.0)
Hemoglobin: 11 g/dL — ABNORMAL LOW (ref 13.0–17.0)
MCH: 32.5 pg (ref 26.0–34.0)
MCHC: 33.3 g/dL (ref 30.0–36.0)
MCV: 97.6 fL (ref 80.0–100.0)
Platelets: 374 10*3/uL (ref 150–400)
RBC: 3.38 MIL/uL — ABNORMAL LOW (ref 4.22–5.81)
RDW: 12 % (ref 11.5–15.5)
WBC: 15.9 10*3/uL — ABNORMAL HIGH (ref 4.0–10.5)
nRBC: 0 % (ref 0.0–0.2)

## 2022-04-29 MED ORDER — POLYETHYLENE GLYCOL 3350 17 G PO PACK: 17.0000 g | PACK | Freq: Every day | ORAL | Status: DC | PRN

## 2022-04-29 MED ORDER — DILTIAZEM HCL ER COATED BEADS 120 MG PO CP24
120.0000 mg | ORAL_CAPSULE | Freq: Every day | ORAL | Status: DC
  Administered 2022-04-30 – 2022-05-01 (×2): 120 mg via ORAL
  Filled 2022-04-29 (×2): qty 1

## 2022-04-29 MED ORDER — POLYETHYLENE GLYCOL 3350 17 G PO PACK: 17.0000 g | PACK | Freq: Every day | ORAL | Status: DC

## 2022-04-29 MED ORDER — PANTOPRAZOLE SODIUM 40 MG IV SOLR
40.0000 mg | Freq: Two times a day (BID) | INTRAVENOUS | Status: DC
  Administered 2022-04-29 – 2022-04-30 (×3): 40 mg via INTRAVENOUS
  Filled 2022-04-29 (×3): qty 10

## 2022-04-29 NOTE — Progress Notes (Signed)
Rounding Note    Patient Name: Kenneth Weber Date of Encounter: 04/29/2022  [facility] Cardiologist: None   Subjective   Feeling well.  Continues to have melena  Inpatient Medications    Scheduled Meds:  arformoterol  15 mcg Nebulization BID   budesonide (PULMICORT) nebulizer solution  0.5 mg Nebulization BID   diltiazem  30 mg Oral Q6H   guaiFENesin  600 mg Oral BID   melatonin  5 mg Oral QHS   pantoprazole (PROTONIX) IV  40 mg Intravenous Q12H   predniSONE  40 mg Oral QAC breakfast   senna-docusate  1 tablet Oral BID   sodium chloride flush  3 mL Intravenous Q12H   Continuous Infusions:  PRN Meds: acetaminophen **OR** acetaminophen, doxylamine (Sleep), levalbuterol, ondansetron **OR** ondansetron (ZOFRAN) IV, oxyCODONE, polyethylene glycol   Vital Signs    Vitals:   04/28/22 1712 04/28/22 2112 04/28/22 2142 04/29/22 0650  BP: 130/84 114/73  123/84  Pulse:      Resp:  20  18  Temp:  (!) 97.4 F (36.3 C)  97.7 F (36.5 C)  TempSrc:  Oral    SpO2:  95% 92% 91%  Weight:      Height:        Intake/Output Summary (Last 24 hours) at 04/29/2022 1501 Last data filed at 04/29/2022 0300 Gross per 24 hour  Intake --  Output 400 ml  Net -400 ml      04/28/2022    5:00 AM 04/22/2022    4:16 PM 12/31/2021   11:57 PM  Last 3 Weights  Weight (lbs) 181 lb 7 oz 187 lb 188 lb  Weight (kg) 82.3 kg 84.823 kg 85.276 kg      Telemetry    Atrial fibrillation.  Rate 90s-120s.  PVCs - Personally Reviewed  ECG    N/a - Personally Reviewed  Physical Exam   VS:  BP 123/84   Pulse 95   Temp 97.7 F (36.5 C)   Resp 18   Ht 6\' 1"  (1.854 m)   Wt 82.3 kg   SpO2 91%   BMI 23.94 kg/m  , BMI Body mass index is 23.94 kg/m. GENERAL:  Well appearing HEENT: Pupils equal round and reactive, fundi not visualized, oral mucosa unremarkable NECK:  No jugular venous distention, waveform within normal limits, carotid upstroke brisk and symmetric, no bruits, no  thyromegaly LUNGS:  Clear to auscultation bilaterally HEART:  Irregularly irregular. PMI not displaced or sustained,S1 and S2 within normal limits, no S3, no S4, no clicks, no rubs, no murmurs ABD:  Flat, positive bowel sounds normal in frequency in pitch, no bruits, no rebound, no guarding, no midline pulsatile mass, no hepatomegaly, no splenomegaly EXT:  2 plus pulses throughout, no edema, no cyanosis no clubbing SKIN:  No rashes no nodules NEURO:  Cranial nerves II through XII grossly intact, motor grossly intact throughout PSYCH:  Cognitively intact, oriented to person place and time   Labs    High Sensitivity Troponin:  No results for input(s): "TROPONINIHS" in the last 720 hours.   Chemistry Recent Labs  Lab 04/22/22 1607 04/23/22 0505 04/24/22 0310 04/26/22 0305 04/28/22 1023 04/29/22 0304  NA 138 136   < > 142 137 138  K 3.9 3.6   < > 4.7 3.9 4.6  CL 103 102   < > 107 100 100  CO2 25 22   < > 29 29 28   GLUCOSE 117* 154*   < > 100* 153* 103*  BUN 13 15   < > 35* 28* 30*  CREATININE 1.22 1.31*   < > 1.27* 1.32* 1.42*  CALCIUM 8.9 8.6*   < > 8.8* 8.8* 8.7*  MG  --  1.9  --  2.2  --   --   PROT 6.9  --   --   --   --   --   ALBUMIN 3.7  --   --   --   --   --   AST 19  --   --   --   --   --   ALT 17  --   --   --   --   --   ALKPHOS 76  --   --   --   --   --   BILITOT 1.0  --   --   --   --   --   GFRNONAA >60 59*   < > >60 58* 53*  ANIONGAP 10 12   < > 6 8 10    < > = values in this interval not displayed.    Lipids No results for input(s): "CHOL", "TRIG", "HDL", "LABVLDL", "LDLCALC", "CHOLHDL" in the last 168 hours.  Hematology Recent Labs  Lab 04/26/22 0305 04/27/22 1206 04/28/22 1023 04/29/22 0304  WBC 15.3*  --  13.5* 15.9*  RBC 2.93*  --  3.66* 3.38*  HGB 9.5* 11.3* 11.9* 11.0*  HCT 28.9* 35.3* 35.3* 33.0*  MCV 98.6  --  96.4 97.6  MCH 32.4  --  32.5 32.5  MCHC 32.9  --  33.7 33.3  RDW 12.3  --  11.9 12.0  PLT 297  --  379 374   Thyroid  Recent  Labs  Lab 04/25/22 0412  TSH 0.158*  FREET4 1.05    BNP Recent Labs  Lab 04/22/22 2345 04/26/22 1017  BNP 36.6 279.8*    DDimer  Recent Labs  Lab 04/23/22 1530 04/26/22 1017  DDIMER 0.97* 0.81*     Radiology    No results found.  Cardiac Studies   Echo 04/26/22: 1. EF challenging in the setting of atrial fibrillation . Left  ventricular ejection fraction, by estimation, is 50 to 55%. The left  ventricle has low normal function. The left ventricle demonstrates global  hypokinesis. Indeterminate diastolic filling  due to E-A fusion.   2. Right ventricular systolic function is normal. The right ventricular  size is normal. Tricuspid regurgitation signal is inadequate for assessing  PA pressure.   3. Left atrial size was mildly dilated.   4. No evidence of mitral valve regurgitation.   5. The aortic valve was not well visualized. Aortic valve regurgitation  is not visualized.   6. The inferior vena cava is normal in size with greater than 50%  respiratory variability, suggesting right atrial pressure of 3 mmHg.   Patient Profile     71 y.o. male with hypertension, hyperlipidemia, and COPD admitted with hypoxic respiratory failure in the setting of COPD exacerbation and RSV.  Cardiology consulted for new onset atrial fibrillation.   Assessment & Plan    # PAF:  Newly diagnosed this admission.  He continues to be in atrial fibrillation.  He is completely asymptomatic.  He was started on Eliquis but developed GI bleeding.  Eliquis is currently on hold.  Going for EGD tomorrow.  Metoprolol was discontinued.  Will transition his diltiazem to 120 mg daily.  LVEF 50 to 55%.  TSH and T3 were both low.  Would repeat once more clinically stable.  Monitor for now.  # Melena: Going for EGD tomorrow as above.  He continues to have melanotic stools.  Hemoglobin ranging 11-11.9.  # Hypertension: Home amlodipine and doxazosin currently on hold.  He has clonidine written on an  as-needed basis.  Would recommend trying to avoid that due to concern for rebound.      For questions or updates, please contact [place] Please consult [email or web address] for contact info under        Signed, Skeet Latch, MD  04/29/2022, 3:01 PM

## 2022-04-29 NOTE — Progress Notes (Signed)
[facility] Progress Note  SUBJECTIVE:   Interval history: Kenneth Weber was seen and evaluated today at bedside.  He was resting comfortably in bed with his wife at bedside.  He noted having a melenic stool today.  Denied abdominal pain.  Denied chest pain or shortness of breath.  He is tolerating a diet.  Hemoglobin 11.0 today, on 2.5 L nasal cannula supplemental oxygen.  Past Medical History:  Diagnosis Date   Allergic rhinitis    Chest pain    COPD (chronic obstructive pulmonary disease) (HCC)    ED (erectile dysfunction)    Hyperlipidemia    Hypertension    SOB (shortness of breath)    History reviewed. No pertinent surgical history. Current Facility-Administered Medications  Medication Dose Route Frequency Provider Last Rate Last Admin   acetaminophen (TYLENOL) tablet 650 mg  650 mg Oral Q6H PRN Opyd, Lavone Neri, MD   650 mg at 04/27/22 7829   Or   acetaminophen (TYLENOL) suppository 650 mg  650 mg Rectal Q6H PRN Opyd, Lavone Neri, MD       arformoterol (BROVANA) nebulizer solution 15 mcg  15 mcg Nebulization BID [place], Eric J, DO   15 mcg at 04/29/22 0918   budesonide (PULMICORT) nebulizer solution 0.5 mg  0.5 mg Nebulization BID [place], Alvira Philips, DO   0.5 mg at 04/29/22 5621   diltiazem (CARDIZEM) tablet 30 mg  30 mg Oral Q6H Rai, Ripudeep K, MD   30 mg at 04/29/22 1200   doxylamine (Sleep) (UNISOM) tablet 25 mg  25 mg Oral QHS PRN Anthoney Harada, NP   25 mg at 04/24/22 2243   guaiFENesin (MUCINEX) 12 hr tablet 600 mg  600 mg Oral BID [place], Eric J, DO   600 mg at 04/29/22 3086   levalbuterol (XOPENEX) nebulizer solution 0.63 mg  0.63 mg Nebulization Q6H PRN [place], Eric J, DO   0.63 mg at 04/26/22 1617   melatonin tablet 5 mg  5 mg Oral QHS Rai, Ripudeep K, MD   5 mg at 04/28/22 2115   ondansetron (ZOFRAN) tablet 4 mg  4 mg Oral Q6H PRN Opyd, Lavone Neri, MD       Or   ondansetron (ZOFRAN) injection 4 mg  4 mg Intravenous Q6H PRN Opyd, Lavone Neri, MD        oxyCODONE (Oxy IR/ROXICODONE) immediate release tablet 5 mg  5 mg Oral Q4H PRN Opyd, Lavone Neri, MD       pantoprazole (PROTONIX) EC tablet 40 mg  40 mg Oral BID AC Rai, Ripudeep K, MD   40 mg at 04/29/22 5784   polyethylene glycol (MIRALAX / GLYCOLAX) packet 17 g  17 g Oral Daily PRN Rai, Ripudeep K, MD       predniSONE (DELTASONE) tablet 40 mg  40 mg Oral QAC breakfast Rai, Ripudeep K, MD   40 mg at 04/29/22 6962   senna-docusate (Senokot-S) tablet 1 tablet  1 tablet Oral BID [place], Eric J, DO   1 tablet at 04/29/22 9528   sodium chloride flush (NS) 0.9 % injection 3 mL  3 mL Intravenous Q12H Briscoe Deutscher, MD   3 mL at 04/29/22 0940   Allergies as of 04/22/2022 - Review Complete 04/22/2022  Allergen Reaction Noted   Other  01/29/2012   Streptogramins  08/30/2013   Codeine Nausea And Vomiting 09/20/2014   Lisinopril Cough 09/20/2014   Losartan potassium-hctz Other (See Comments) 09/20/2014   Review of Systems:  Review of Systems  Respiratory:  Negative for shortness of breath.   Cardiovascular:  Negative for chest pain.  Gastrointestinal:  Positive for melena. Negative for abdominal pain, nausea and vomiting.    OBJECTIVE:   Temp:  [97.4 F (36.3 C)-97.7 F (36.5 C)] 97.7 F (36.5 C) (12/25 0650) Pulse Rate:  [95-104] 95 (12/24 1445) Resp:  [18-31] 18 (12/25 0650) BP: (96-130)/(58-84) 123/84 (12/25 0650) SpO2:  [90 %-95 %] 91 % (12/25 0650) Last BM Date : 04/27/22 Physical Exam Constitutional:      General: He is not in acute distress.    Appearance: He is not ill-appearing, toxic-appearing or diaphoretic.  Cardiovascular:     Rate and Rhythm: Tachycardia present. Rhythm irregular.  Pulmonary:     Effort: No respiratory distress.     Breath sounds: Normal breath sounds.     Comments: Supplemental oxygen nasal cannula 2.5 L in place at time of exam Abdominal:     General: Bowel sounds are normal. There is no distension.     Palpations: Abdomen is soft.     Tenderness:  There is no abdominal tenderness. There is no guarding.  Skin:    General: Skin is warm and dry.  Neurological:     Mental Status: He is alert.     Labs: Recent Labs    04/27/22 1206 04/28/22 1023 04/29/22 0304  WBC  --  13.5* 15.9*  HGB 11.3* 11.9* 11.0*  HCT 35.3* 35.3* 33.0*  PLT  --  379 374   BMET Recent Labs    04/28/22 1023 04/29/22 0304  NA 137 138  K 3.9 4.6  CL 100 100  CO2 29 28  GLUCOSE 153* 103*  BUN 28* 30*  CREATININE 1.32* 1.42*  CALCIUM 8.8* 8.7*   LFT No results for input(s): "PROT", "ALBUMIN", "AST", "ALT", "ALKPHOS", "BILITOT", "BILIDIR", "IBILI" in the last 72 hours. PT/INR No results for input(s): "LABPROT", "INR" in the last 72 hours. Diagnostic imaging: No results found.  IMPRESSION: Melena Acute hypoxemic respiratory failure -Interstitial pneumonia on CT imaging, not on antibiotics currently -History COPD (on Prednisone 40 mg PO daily), history tobacco use, current RSV             -No prior to admission supplemental oxygen therapy, currently on 2.5 L nasal cannula Atrial fibrillation with rapid ventricular response, new this admission             -Started on Eliquis 04/25/22 and subsequently developed melena, now on hold             -Tachycardic on exam today into 110+ rate Normocytic anemia Leukocytosis, suspect iatrogenic secondary to steroids Family history colon cancer, father  PLAN: -Change to IV (from PO) PPI every 12 hours -Trend H/H, transfuse for hemoglobin less than 7 -Continue to hold Eliquis, Cardiology consultation reviewed -Plan for EGD 04/30/2022 with Dr. Vida Rigger pending no further workup from cardiology standpoint, patient in agreement -N.p.o. at midnight for procedure 04/30/2022 -Further recommendations to follow pending procedure   LOS: 6 days   Liliane Shi, DO [facility]

## 2022-04-29 NOTE — Progress Notes (Signed)
[facility]                                                                              Kenneth Weber, is a 72 y.o. male, DOB - 1950-12-15, [id number] Admit date - 04/22/2022    Outpatient Primary MD for the patient is [name], MD  LOS - 6  days  Chief Complaint  Patient presents with   Shortness of Breath       Brief summary   Patient is a 71 year old male with HTN, COPD presented from urgent care with progressive shortness of breath.  He was recently diagnosed with RSV on 12/8.  Subsequently developed productive cough and returned to urgent care on 12/18, found to be hypoxic and directed to ED for further evaluation. In ED temp 99.4 F, HR 99, RR 26, O2 sats 84% on room air, WBCs 13.9, creatinine 1.2.  Lactic acid 0.8, procalcitonin 0.24.  COVID-19 PCR negative, flu A&B negative, RSV positive Chest x-ray with small patchy infiltrates left lower lung suggestive of interstitial pneumonia.   Assessment & Plan    Principal Problem:   Acute respiratory failure with hypoxia ([place]), RSV positive PNA -Presented with progressive shortness of breath, hypoxic in ED, O2 sats 84%, not on O2 at baseline.   -CTA negative for acute PE, venous Dopplers negative for DVT -Overall improving, O2 sats 92% on 2 L.,  Wean as tolerated -Will need home O2 evaluation prior to discharge  Active Problems: Community-acquired pneumonia COPD exacerbation -Completed IV Rocephin  -Much improved, continue Pulmicort, Brovana, DuoNebs, prednisone   Atrial fibrillation with rapid ventricular response ([place]), acute diastolic CHF -BNP [id number], likely triggered by COPD, RSV pneumonia -2D echo showed EF of 50 to 55%, global hypokinesis, normal RV SF  -TSH low 0.15, free T4 normal, repeat studies in 4 weeks -CHA2DS2-VASc 2 for age, hypertension, recommended AC -Apixaban currently on hold, had black stool on 12/23, FOBT positive -BP soft, beta-blocker was discontinued on 12/24, for now  continue Cardizem, rate cardiology recommendations   Constipation, then melena/GI bleed -Per patient, constipated for last 6 days, received MiraLAX, Senokot, Dulcolax on 12/23.  Subsequently had a BM which was melanotic.  H&H stable, however FOBT positive. -Eliquis held, GI consulted. -Plan for endoscopy on 12/26, n.p.o. after midnight -Continue PPI    Hypertension -BP soft this morning, currently on Cardizem 30 mg every 6 hours    Bilateral lower extremity edema Could be side effect from amlodipine, venous Dopplers negative for DVT  Tobacco use disorder -Smoking cessation counseling done   Code Status: Full CODE STATUS DVT Prophylaxis:     Level of Care: Level of care: Telemetry Medical Family Communication: Updated patient's wife on the phone on 12/24 Disposition Plan:      Remains inpatient appropriate: Pending EGD on 12/26   Procedures:  2D echo   Consultants:   Cardiology Gastroenterology  Antimicrobials:   Anti-infectives (From admission, onward)    Start     Dose/Rate Route Frequency Ordered Stop   04/23/22 2200  cefTRIAXone (ROCEPHIN) 2 g in sodium chloride 0.9 % 100 mL IVPB        2 g  200 mL/hr over 30 Minutes Intravenous Every 24 hours 04/23/22 0205 04/26/22 2238   04/23/22 1800  azithromycin (ZITHROMAX) tablet 500 mg        500 mg Oral Daily 04/23/22 0205 04/26/22 0849   04/22/22 2330  cefTRIAXone (ROCEPHIN) 2 g in sodium chloride 0.9 % 100 mL IVPB  Status:  Discontinued        2 g 200 mL/hr over 30 Minutes Intravenous Every 24 hours 04/22/22 2325 04/23/22 0206   04/22/22 2330  azithromycin (ZITHROMAX) 500 mg in sodium chloride 0.9 % 250 mL IVPB  Status:  Discontinued        500 mg 250 mL/hr over 60 Minutes Intravenous Every 24 hours 04/22/22 2325 04/23/22 0207          Medications  arformoterol  15 mcg Nebulization BID   budesonide (PULMICORT) nebulizer solution  0.5 mg Nebulization BID   diltiazem  30 mg Oral Q6H   guaiFENesin  600 mg Oral BID    melatonin  5 mg Oral QHS   pantoprazole  40 mg Oral BID AC   polyethylene glycol  17 g Oral BID   predniSONE  40 mg Oral QAC breakfast   senna-docusate  1 tablet Oral BID   sodium chloride flush  3 mL Intravenous Q12H      Subjective:   Kenneth Weber was seen and examined today.  No acute complaint, had BM yesterday, black.  Heart rate controlled.  No chest pain, shortness of breath is improving.  Wheezing much improved.   Vitals:   04/28/22 1712 04/28/22 2112 04/28/22 2142 04/29/22 0650  BP: 130/84 114/73  123/84  Pulse:      Resp:  20  18  Temp:  (!) 97.4 F (36.3 C)  97.7 F (36.5 C)  TempSrc:  Oral    SpO2:  95% 92% 91%  Weight:      Height:        Intake/Output Summary (Last 24 hours) at 04/29/2022 1037 Last data filed at 04/29/2022 0300 Gross per 24 hour  Intake --  Output 400 ml  Net -400 ml      Wt Readings from Last 3 Encounters:  04/28/22 82.3 kg  12/31/21 85.3 kg  12/23/21 85.3 kg    Physical Exam General: Alert and oriented x 3, NAD Cardiovascular: S1 S2 clear, RRR.  Respiratory: Mild bilateral scattered wheezing, improving today Gastrointestinal: Soft, nontender, nondistended, NBS Ext: no pedal edema bilaterally Neuro: no new deficits Psych: Normal affect     Data Reviewed:  I have personally reviewed following labs    CBC Lab Results  Component Value Date   WBC 15.9 (H) 04/29/2022   RBC 3.38 (L) 04/29/2022   HGB 11.0 (L) 04/29/2022   HCT 33.0 (L) 04/29/2022   MCV 97.6 04/29/2022   MCH 32.5 04/29/2022   PLT 374 04/29/2022   MCHC 33.3 04/29/2022   RDW 12.0 04/29/2022   LYMPHSABS 0.8 04/22/2022   MONOABS 1.4 (H) 04/22/2022   EOSABS 0.0 04/22/2022   BASOSABS 0.0 04/22/2022     Last metabolic panel Lab Results  Component Value Date   NA 138 04/29/2022   K 4.6 04/29/2022   CL 100 04/29/2022   CO2 28 04/29/2022   BUN 30 (H) 04/29/2022   CREATININE 1.42 (H) 04/29/2022   GLUCOSE 103 (H) 04/29/2022   GFRNONAA 53 (L)  04/29/2022   CALCIUM 8.7 (L) 04/29/2022   PROT 6.9 04/22/2022   ALBUMIN 3.7 04/22/2022   BILITOT 1.0 04/22/2022  ALKPHOS 76 04/22/2022   AST 19 04/22/2022   ALT 17 04/22/2022   ANIONGAP 10 04/29/2022       Radiology Studies: I have personally reviewed the imaging studies  No results found.     Estill Cotta M.D. [facility] 04/29/2022, 10:37 AM  Available via Epic secure chat 7am-7pm After 7 pm, please refer to night coverage provider listed on amion.

## 2022-04-30 ENCOUNTER — Encounter (HOSPITAL_COMMUNITY)
Admission: EM | Disposition: A | Discharge status: Home / Self Care | Payer: Self-pay | Source: Home / Self Care | Attending: Internal Medicine

## 2022-04-30 ENCOUNTER — Inpatient Hospital Stay (HOSPITAL_COMMUNITY): Payer: Medicare Other | Admitting: Anesthesiology

## 2022-04-30 ENCOUNTER — Encounter (HOSPITAL_COMMUNITY): Payer: Self-pay | Admitting: Family Medicine

## 2022-04-30 DIAGNOSIS — J449 Chronic obstructive pulmonary disease, unspecified: Secondary | ICD-10-CM

## 2022-04-30 DIAGNOSIS — I4891 Unspecified atrial fibrillation: Secondary | ICD-10-CM | POA: Diagnosis not present

## 2022-04-30 DIAGNOSIS — D62 Acute posthemorrhagic anemia: Secondary | ICD-10-CM

## 2022-04-30 DIAGNOSIS — Z87891 Personal history of nicotine dependence: Secondary | ICD-10-CM

## 2022-04-30 DIAGNOSIS — J441 Chronic obstructive pulmonary disease with (acute) exacerbation: Secondary | ICD-10-CM | POA: Diagnosis not present

## 2022-04-30 DIAGNOSIS — K449 Diaphragmatic hernia without obstruction or gangrene: Secondary | ICD-10-CM

## 2022-04-30 DIAGNOSIS — J189 Pneumonia, unspecified organism: Secondary | ICD-10-CM | POA: Diagnosis not present

## 2022-04-30 DIAGNOSIS — K2289 Other specified disease of esophagus: Secondary | ICD-10-CM

## 2022-04-30 DIAGNOSIS — K921 Melena: Secondary | ICD-10-CM | POA: Diagnosis not present

## 2022-04-30 DIAGNOSIS — J9601 Acute respiratory failure with hypoxia: Secondary | ICD-10-CM | POA: Diagnosis not present

## 2022-04-30 HISTORY — PX: ESOPHAGOGASTRODUODENOSCOPY: SHX5428

## 2022-04-30 SURGERY — EGD (ESOPHAGOGASTRODUODENOSCOPY)
Anesthesia: Monitor Anesthesia Care

## 2022-04-30 MED ORDER — PROPOFOL 500 MG/50ML IV EMUL
INTRAVENOUS | Status: DC | PRN
  Administered 2022-04-30: 175 ug/kg/min via INTRAVENOUS

## 2022-04-30 MED ORDER — PHENYLEPHRINE 80 MCG/ML (10ML) SYRINGE FOR IV PUSH (FOR BLOOD PRESSURE SUPPORT)
PREFILLED_SYRINGE | INTRAVENOUS | Status: DC | PRN
  Administered 2022-04-30: 160 ug via INTRAVENOUS

## 2022-04-30 MED ORDER — PANTOPRAZOLE SODIUM 40 MG PO TBEC
40.0000 mg | DELAYED_RELEASE_TABLET | Freq: Every day | ORAL | Status: DC
  Administered 2022-04-30 – 2022-05-01 (×2): 40 mg via ORAL
  Filled 2022-04-30: qty 1

## 2022-04-30 MED ORDER — LACTATED RINGERS IV SOLN: INTRAVENOUS | Status: DC

## 2022-04-30 MED ORDER — PROPOFOL 10 MG/ML IV BOLUS
INTRAVENOUS | Status: DC | PRN
  Administered 2022-04-30: 15 mg via INTRAVENOUS
  Administered 2022-04-30: 30 mg via INTRAVENOUS

## 2022-04-30 MED ORDER — NYSTATIN 100000 UNIT/ML MT SUSP
5.0000 mL | Freq: Four times a day (QID) | OROMUCOSAL | Status: DC
  Administered 2022-04-30 – 2022-05-01 (×4): 500000 [IU] via ORAL
  Filled 2022-04-30 (×4): qty 5

## 2022-04-30 MED ORDER — LIDOCAINE 2% (20 MG/ML) 5 ML SYRINGE
INTRAMUSCULAR | Status: DC | PRN
  Administered 2022-04-30: 20 mg via INTRAVENOUS
  Administered 2022-04-30: 60 mg via INTRAVENOUS

## 2022-04-30 NOTE — Progress Notes (Signed)
Rounding Note    Patient Name: Kenneth Weber Date of Encounter: 04/30/2022  [place] Cardiologist: None   Subjective   Patient feeling well this AM. No chest pain, palpitations, sob, ankle edema. Weaning off supplemental Oxygen   Inpatient Medications    Scheduled Meds:  arformoterol  15 mcg Nebulization BID   budesonide (PULMICORT) nebulizer solution  0.5 mg Nebulization BID   diltiazem  120 mg Oral Daily   guaiFENesin  600 mg Oral BID   melatonin  5 mg Oral QHS   pantoprazole (PROTONIX) IV  40 mg Intravenous Q12H   predniSONE  40 mg Oral QAC breakfast   senna-docusate  1 tablet Oral BID   sodium chloride flush  3 mL Intravenous Q12H   Continuous Infusions:  PRN Meds: acetaminophen **OR** acetaminophen, doxylamine (Sleep), levalbuterol, ondansetron **OR** ondansetron (ZOFRAN) IV, oxyCODONE, polyethylene glycol   Vital Signs    Vitals:   04/29/22 2022 04/29/22 2038 04/30/22 0456 04/30/22 0734  BP:   108/82 109/80  Pulse: 95  95 87  Resp:   (!) 24 18  Temp: 97.7 F (36.5 C)  97.6 F (36.4 C) 98 F (36.7 C)  TempSrc: Oral  Oral Oral  SpO2: 95% 98% 93% 100%  Weight:      Height:        Intake/Output Summary (Last 24 hours) at 04/30/2022 0758 Last data filed at 04/30/2022 0517 Gross per 24 hour  Intake --  Output 1100 ml  Net -1100 ml      04/28/2022    5:00 AM 04/22/2022    4:16 PM 12/31/2021   11:57 PM  Last 3 Weights  Weight (lbs) 181 lb 7 oz 187 lb 188 lb  Weight (kg) 82.3 kg 84.823 kg 85.276 kg      Telemetry    Atrial fibrillation, HR in the 80s-90s - Personally Reviewed  ECG    No new tracings - Personally Reviewed  Physical Exam   GEN: No acute distress.  Laying flat in the bed. Wearing [place]  Neck: No JVD Cardiac: Irregular rate and rhythm. No murmurs, rubs, or gallops. Radial pulses 2+ bilaterally  Respiratory: Clear to auscultation bilaterally. Normal WOB on [place]  GI: Soft, nontender, non-distended  MS: No edema; No  deformity. Neuro:  Nonfocal  Psych: Normal affect   Labs    High Sensitivity Troponin:  No results for input(s): "TROPONINIHS" in the last 720 hours.   Chemistry Recent Labs  Lab 04/26/22 0305 04/28/22 1023 04/29/22 0304  NA 142 137 138  K 4.7 3.9 4.6  CL 107 100 100  CO2 29 29 28   GLUCOSE 100* 153* 103*  BUN 35* 28* 30*  CREATININE 1.27* 1.32* 1.42*  CALCIUM 8.8* 8.8* 8.7*  MG 2.2  --   --   GFRNONAA >60 58* 53*  ANIONGAP 6 8 10     Lipids No results for input(s): "CHOL", "TRIG", "HDL", "LABVLDL", "LDLCALC", "CHOLHDL" in the last 168 hours.  Hematology Recent Labs  Lab 04/26/22 0305 04/27/22 1206 04/28/22 1023 04/29/22 0304  WBC 15.3*  --  13.5* 15.9*  RBC 2.93*  --  3.66* 3.38*  HGB 9.5* 11.3* 11.9* 11.0*  HCT 28.9* 35.3* 35.3* 33.0*  MCV 98.6  --  96.4 97.6  MCH 32.4  --  32.5 32.5  MCHC 32.9  --  33.7 33.3  RDW 12.3  --  11.9 12.0  PLT 297  --  379 374   Thyroid  Recent Labs  Lab 04/25/22 0412  TSH 0.158*  FREET4 1.05    BNP Recent Labs  Lab 04/26/22 1017  BNP 279.8*    DDimer  Recent Labs  Lab 04/23/22 1530 04/26/22 1017  DDIMER 0.97* 0.81*     Radiology    No results found.  Cardiac Studies   Echo 04/26/22: 1. EF challenging in the setting of atrial fibrillation . Left  ventricular ejection fraction, by estimation, is 50 to 55%. The left  ventricle has low normal function. The left ventricle demonstrates global  hypokinesis. Indeterminate diastolic filling  due to E-A fusion.   2. Right ventricular systolic function is normal. The right ventricular  size is normal. Tricuspid regurgitation signal is inadequate for assessing  PA pressure.   3. Left atrial size was mildly dilated.   4. No evidence of mitral valve regurgitation.   5. The aortic valve was not well visualized. Aortic valve regurgitation  is not visualized.   6. The inferior vena cava is normal in size with greater than 50%  respiratory variability, suggesting right  atrial pressure of 3 mmHg.   Patient Profile     71 y.o. male with hypertension, hyperlipidemia, and COPD admitted with hypoxic respiratory failure in the setting of COPD exacerbation and RSV. Cardiology consulted for new onset atrial fibrillation.   Assessment & Plan    Paroxysmal Atrial Fibrillation - Newly diagnosed this admission in the setting of COPD exacerbation, RSV, CAP  - Per telemetry, patient remains in atrial fibrillation. HR in the 80s-90s - Continue diltiazem 120 mg daily-- bp low end of normal and stable - CHADS-VASc 2-- patient was started on eliquis this admission but developed GI bleeding. AC now on hold. GI following, patient has EGD today  - No AADs or DCCV until patient is able to tolerate anticoagulation  - Echo this admission with EF 50-55% - TSH low, free T4 norma. Plan to repeat in 4 weeks   Melena  - GI consulted, EGD today as above  - Hemoglobin has ranged from 11.0-11.9 over the past 3 days   Hypertension  - Patient's BP actually low end of normal while on diltiazem 120 mg dialy  - Home amlodipine, doxazosin held - Has clonidine written on an as-needed basis-- however, would avoid due to concerns for rebound HTN     For questions or updates, please contact [facility] Please consult [email or web address] for contact info under        Signed, Jonita Albee, PA-C  04/30/2022, 7:58 AM

## 2022-04-30 NOTE — Anesthesia Preprocedure Evaluation (Signed)
Anesthesia Evaluation  Patient identified by MRN, date of birth, ID band Patient awake    Reviewed: Allergy & Precautions, NPO status , Patient's Chart, lab work & pertinent test results, reviewed documented beta blocker date and time   Airway Mallampati: II  TM Distance: >3 FB     Dental no notable dental hx. (+) Teeth Intact   Pulmonary pneumonia, unresolved, COPD,  COPD inhaler, former smoker CXR 04/22/22 There is small patchy infiltrate in left lower lung field suggesting interstitial pneumonia. There are no new infiltrates or signs of pulmonary edema in comparison with the study done earlier today. There is no pleural effusion.  Blood test + for RSV   Pulmonary exam normal breath sounds clear to auscultation       Cardiovascular hypertension, Pt. on medications Normal cardiovascular exam+ dysrhythmias Atrial Fibrillation  Rhythm:Irregular Rate:Normal  EKG 04/25/22 Atrial fibrillation with RVR 128/min, cannot r/o anterior infarct  Echo 04/26/22 1. EF challenging in the setting of atrial fibrillation . Left  ventricular ejection fraction, by estimation, is 50 to 55%. The left  ventricle has low normal function. The left ventricle demonstrates global  hypokinesis. Indeterminate diastolic filling  due to E-A fusion.   2. Right ventricular systolic function is normal. The right ventricular  size is normal. Tricuspid regurgitation signal is inadequate for assessing  PA pressure.   3. Left atrial size was mildly dilated.   4. No evidence of mitral valve regurgitation.   5. The aortic valve was not well visualized. Aortic valve regurgitation  is not visualized.   6. The inferior vena cava is normal in size with greater than 50%  respiratory variability, suggesting right atrial pressure of 3 mmHg.     Neuro/Psych Glaucoma  negative psych ROS   GI/Hepatic Neg liver ROS,,,Melena   Endo/Other  Hyperlipidemia   Renal/GU Renal InsufficiencyRenal disease  negative genitourinary   Musculoskeletal negative musculoskeletal ROS (+)    Abdominal   Peds  Hematology  (+) Blood dyscrasia, anemia   Anesthesia Other Findings   Reproductive/Obstetrics ED                              Anesthesia Physical Anesthesia Plan  ASA: 3  Anesthesia Plan: MAC   Post-op Pain Management: Minimal or no pain anticipated   Induction: Intravenous  PONV Risk Score and Plan: 1 and Propofol infusion, Treatment may vary due to age or medical condition and TIVA  Airway Management Planned: Natural Airway and Nasal Cannula  Additional Equipment: None  Intra-op Plan:   Post-operative Plan:   Informed Consent: I have reviewed the patients History and Physical, chart, labs and discussed the procedure including the risks, benefits and alternatives for the proposed anesthesia with the patient or authorized representative who has indicated his/her understanding and acceptance.     Dental advisory given  Plan Discussed with: CRNA and Anesthesiologist  Anesthesia Plan Comments:          Anesthesia Quick Evaluation

## 2022-04-30 NOTE — Progress Notes (Signed)
[facility]                                                                              Kenneth Weber, is a 71 y.o. male, DOB - May 23, 1950, [id number] Admit date - 04/22/2022    Outpatient Primary MD for the patient is [name], MD  LOS - 6  days  Chief Complaint  Patient presents with   Shortness of Breath       Brief summary   Patient is a 71 year old male with HTN, COPD presented from urgent care with progressive shortness of breath.  He was recently diagnosed with RSV on 12/8.  Subsequently developed productive cough and returned to urgent care on 12/18, found to be hypoxic and directed to ED for further evaluation. In ED temp 99.4 F, HR 99, RR 26, O2 sats 84% on room air, WBCs 13.9, creatinine 1.2.  Lactic acid 0.8, procalcitonin 0.24.  COVID-19 PCR negative, flu A&B negative, RSV positive Chest x-ray with small patchy infiltrates left lower lung suggestive of interstitial pneumonia.   Assessment & Plan    Principal Problem:   Acute respiratory failure with hypoxia ([place]), RSV positive PNA -Presented with progressive shortness of breath, hypoxic in ED, O2 sats 84%, not on O2 at baseline.   -CTA negative for acute PE, venous Dopplers negative for DVT -Improving, O2 sats 95% on 2.5 L, wean as tolerated - home O2 evaluation prior to discharge  Active Problems: Community-acquired pneumonia COPD exacerbation -Completed IV Rocephin  -Much improved, will continue Pulmicort, Brovana, DuoNebs, taper prednisone in a.m.   Atrial fibrillation with rapid ventricular response (HCC), acute diastolic CHF -BNP [id number], likely triggered by COPD, RSV pneumonia -2D echo showed EF of 50 to 55%, global hypokinesis, normal RV SF  -TSH low 0.15, free T4 normal, repeat studies in 4 weeks -CHA2DS2-VASc 2 for age, hypertension, recommended AC -Apixaban currently on hold, continues to have melanotic stools, FOBT positive.  -Plan for EGD today.  Had black stool on  12/23, FOBT positive -Continue Cardizem ER 120 mg daily -Outpatient follow-up with Dr. Davina Poke  Constipation, then melena/GI bleed -Per patient, constipated for 6 days, received MiraLAX, Senokot, Dulcolax on 12/23.  Subsequently had been having melanotic stools.  -Eliquis held, GI consulted, EGD planned today -Continue PPI     Hypertension -Stable, continue Cardizem 120 mg daily    Bilateral lower extremity edema Could be side effect from amlodipine, venous Dopplers negative for DVT  Tobacco use disorder -Smoking cessation counseling done   Code Status: Full CODE STATUS DVT Prophylaxis:     Level of Care: Level of care: Telemetry Medical Family Communication: Updated patient's wife on the phone on 12/24 Disposition Plan:      Remains inpatient appropriate: Pending EGD on 12/26   Procedures:  2D echo   Consultants:   Cardiology Gastroenterology  Antimicrobials:   Anti-infectives (From admission, onward)    Start     Dose/Rate Route Frequency Ordered Stop   04/23/22 2200  cefTRIAXone (ROCEPHIN) 2 g in sodium chloride 0.9 % 100 mL IVPB        2 g 200 mL/hr over 30 Minutes Intravenous Every  24 hours 04/23/22 0205 04/26/22 2238   04/23/22 1800  azithromycin (ZITHROMAX) tablet 500 mg        500 mg Oral Daily 04/23/22 0205 04/26/22 0849   04/22/22 2330  cefTRIAXone (ROCEPHIN) 2 g in sodium chloride 0.9 % 100 mL IVPB  Status:  Discontinued        2 g 200 mL/hr over 30 Minutes Intravenous Every 24 hours 04/22/22 2325 04/23/22 0206   04/22/22 2330  azithromycin (ZITHROMAX) 500 mg in sodium chloride 0.9 % 250 mL IVPB  Status:  Discontinued        500 mg 250 mL/hr over 60 Minutes Intravenous Every 24 hours 04/22/22 2325 04/23/22 0207          Medications  arformoterol  15 mcg Nebulization BID   budesonide (PULMICORT) nebulizer solution  0.5 mg Nebulization BID   diltiazem  30 mg Oral Q6H   guaiFENesin  600 mg Oral BID   melatonin  5 mg Oral QHS   pantoprazole  40  mg Oral BID AC   polyethylene glycol  17 g Oral BID   predniSONE  40 mg Oral QAC breakfast   senna-docusate  1 tablet Oral BID   sodium chloride flush  3 mL Intravenous Q12H      Subjective:   Kenneth Weber was seen and examined today.  No acute complaints today, n.p.o. for EGD today.  Shortness of breath and wheezing has much improved.  Heart rate controlled, still in A-fib.   Vitals:   04/28/22 1712 04/28/22 2112 04/28/22 2142 04/29/22 0650  BP: 130/84 114/73  123/84  Pulse:      Resp:  20  18  Temp:  (!) 97.4 F (36.3 C)  97.7 F (36.5 C)  TempSrc:  Oral    SpO2:  95% 92% 91%  Weight:      Height:        Intake/Output Summary (Last 24 hours) at 04/29/2022 1037 Last data filed at 04/29/2022 0300 Gross per 24 hour  Intake --  Output 400 ml  Net -400 ml      Wt Readings from Last 3 Encounters:  04/28/22 82.3 kg  12/31/21 85.3 kg  12/23/21 85.3 kg   Physical Exam General: Alert and oriented x 3, NAD Cardiovascular: S1 S2 clear, RRR.  Respiratory: CTAB, no wheezing, rales or rhonchi Gastrointestinal: Soft, nontender, nondistended, NBS Ext: no pedal edema bilaterally Neuro: no new deficits Psych: Normal affect     Data Reviewed:  I have personally reviewed following labs    CBC Lab Results  Component Value Date   WBC 15.9 (H) 04/29/2022   RBC 3.38 (L) 04/29/2022   HGB 11.0 (L) 04/29/2022   HCT 33.0 (L) 04/29/2022   MCV 97.6 04/29/2022   MCH 32.5 04/29/2022   PLT 374 04/29/2022   MCHC 33.3 04/29/2022   RDW 12.0 04/29/2022   LYMPHSABS 0.8 04/22/2022   MONOABS 1.4 (H) 04/22/2022   EOSABS 0.0 04/22/2022   BASOSABS 0.0 04/22/2022     Last metabolic panel Lab Results  Component Value Date   NA 138 04/29/2022   K 4.6 04/29/2022   CL 100 04/29/2022   CO2 28 04/29/2022   BUN 30 (H) 04/29/2022   CREATININE 1.42 (H) 04/29/2022   GLUCOSE 103 (H) 04/29/2022   GFRNONAA 53 (L) 04/29/2022   CALCIUM 8.7 (L) 04/29/2022   PROT 6.9 04/22/2022    ALBUMIN 3.7 04/22/2022   BILITOT 1.0 04/22/2022   ALKPHOS 76 04/22/2022   AST 19  04/22/2022   ALT 17 04/22/2022   ANIONGAP 10 04/29/2022       Radiology Studies: I have personally reviewed the imaging studies  No results found.     Estill Cotta M.D. [facility] 04/29/2022, 10:37 AM  Available via Epic secure chat 7am-7pm After 7 pm, please refer to night coverage provider listed on amion.

## 2022-04-30 NOTE — Op Note (Signed)
[name] Procedure Date : 04/30/2022 MRN: [id number] Attending MD: Clarene Essex , MD, [id number] Date of Birth: [birth year] CSN: [id number] Age: 71 Admit Type: Outpatient Procedure:                Upper GI endoscopy Indications:              Acute post hemorrhagic anemia, Melena Providers:                Clarene Essex, MD, Jamison Neighbor RN, RN, Gloris Ham, Technician Referring MD:              Medicines:                Monitored Anesthesia Care Complications:            No immediate complications. Estimated Blood Loss:     Estimated blood loss: none. Procedure:                Pre-Anesthesia Assessment:                           - Prior to the procedure, a History and Physical                            was performed, and patient medications and                            allergies were reviewed. The patient's tolerance of                            previous anesthesia was also reviewed. The risks                            and benefits of the procedure and the sedation                            options and risks were discussed with the patient.                            All questions were answered, and informed consent                            was obtained. Prior Anticoagulants: The patient has                            taken Eliquis (apixaban), last dose was 2 days                            prior to procedure. ASA Grade Assessment: III - A                            patient with severe systemic disease. After  reviewing the risks and benefits, the patient was                            deemed in satisfactory condition to undergo the                            procedure.                           After obtaining informed consent, the endoscope was                            passed under direct vision. Throughout the                            procedure, the patient's blood  pressure, pulse, and                            oxygen saturations were monitored continuously. The                            [id number]) Olympus endoscope was introduced                            through the mouth, and advanced to the third part                            of duodenum. The upper GI endoscopy was                            accomplished without difficulty. The patient                            tolerated the procedure well. Scope In: Scope Out: Findings:      The larynx was normal. Minimal posterior pharynx probable Candida as well      Patchy, white plaques were found in the entire esophagus. Compatible       with probable Candida      The entire examined stomach was normal.      The duodenal bulb, first portion of the duodenum and third portion of       the duodenum were normal.      The exam was otherwise without abnormality. No signs of bleeding was seen      A small hiatal hernia was present. Impression:               - Normal larynx.                           - Esophageal plaques were found, consistent with                            candidiasis.                           - Normal stomach.                           -  Normal duodenal bulb, first portion of the                            duodenum and third portion of the duodenum.                           - The examination was otherwise normal.                           - Small hiatal hernia.                           - No specimens collected. Recommendation:           - Soft diet today.                           - Continue present medications. Consider retrying                            blood thinners in a few days versus just using an                            aspirin following him clinically and if ongoing                            bleeding probably proceed with capsule endoscopy                            next since last colonoscopy was in 22                           - Return to GI clinic PRN.  Will check on tomorrow                           - Telephone GI clinic if symptomatic PRN. Procedure Code(s):        --- Professional ---                           [phone], Esophagogastroduodenoscopy, flexible,                            transoral; diagnostic, including collection of                            specimen(s) by brushing or washing, when performed                            (separate procedure) Diagnosis Code(s):        --- Professional ---                           K22.9, Disease of esophagus, unspecified                           D62, Acute posthemorrhagic anemia  K92.1, Melena (includes Hematochezia) CPT copyright 2022 [employer]. All rights reserved. The codes documented in this report are preliminary and upon coder review may  be revised to meet current compliance requirements. Vida Rigger, MD 04/30/2022 1:42:08 PM This report has been signed electronically. Number of Addenda: 0

## 2022-04-30 NOTE — Progress Notes (Signed)
Kenneth Weber 1:08 PM  Subjective: Patient seen and examined and case discussed with my partner Dr. Lorenso Quarry and his hospital computer chart reviewed and he has not had any pain but still has black stools and has had some colonoscopies before but no previous endoscopy and his Eliquis was stopped 2 days ago  Objective: Signs stable afebrile no acute distress exam please see preassessment evaluation labs and previous CT reviewed labs slight decrease hemoglobin BUN and creatinine fairly stable with slight decreasing BUN with slight increase in creatinine  Assessment: Melena in a patient on blood thinner for A-fib  Plan: Risk benefits methods of endoscopy was discussed with the patient and we will proceed today with anesthesia assistance with further workup and plans pending those findings  [facility]  office [phone] After 5PM or if no answer call [phone]

## 2022-04-30 NOTE — Transfer of Care (Signed)
Immediate Anesthesia Transfer of Care Note  Patient: Kenneth Weber  Procedure(s) Performed: ESOPHAGOGASTRODUODENOSCOPY (EGD)  Patient Location: Short Stay  Anesthesia Type:MAC  Level of Consciousness: drowsy  Airway & Oxygen Therapy: Patient Spontanous Breathing and Patient connected to nasal cannula oxygen  Post-op Assessment: Report given to RN and Post -op Vital signs reviewed and stable  Post vital signs: Reviewed and stable  Last Vitals:  Vitals Value Taken Time  BP    Temp    Pulse    Resp    SpO2      Last Pain:  Vitals:   04/30/22 1236  TempSrc: Temporal  PainSc: 0-No pain         Complications: No notable events documented.

## 2022-04-30 NOTE — Anesthesia Postprocedure Evaluation (Signed)
Anesthesia Post Note  Patient: Kenneth Weber  Procedure(s) Performed: ESOPHAGOGASTRODUODENOSCOPY (EGD)     Patient location during evaluation: PACU Anesthesia Type: MAC Level of consciousness: awake and alert and oriented Pain management: pain level controlled Vital Signs Assessment: post-procedure vital signs reviewed and stable Respiratory status: spontaneous breathing, nonlabored ventilation, respiratory function stable and patient connected to nasal cannula oxygen Cardiovascular status: stable and blood pressure returned to baseline Postop Assessment: no apparent nausea or vomiting Anesthetic complications: no   No notable events documented.  Last Vitals:  Vitals:   04/30/22 1350 04/30/22 1357  BP: 103/76 111/76  Pulse: (!) 105 94  Resp: (!) 23 (!) 25  Temp:    SpO2: 92% 92%    Last Pain:  Vitals:   04/30/22 1357  TempSrc:   PainSc: 0-No pain                 Mariesha Venturella A.

## 2022-04-30 NOTE — Anesthesia Procedure Notes (Signed)
Procedure Name: MAC Date/Time: 04/30/2022 1:21 PM  Performed by: Erick Colace, CRNAPre-anesthesia Checklist: Patient identified, Emergency Drugs available, Suction available and Patient being monitored Patient Re-evaluated:Patient Re-evaluated prior to induction Oxygen Delivery Method: Nasal cannula

## 2022-05-01 DIAGNOSIS — J9601 Acute respiratory failure with hypoxia: Secondary | ICD-10-CM | POA: Diagnosis not present

## 2022-05-01 DIAGNOSIS — I4819 Other persistent atrial fibrillation: Secondary | ICD-10-CM

## 2022-05-01 DIAGNOSIS — J189 Pneumonia, unspecified organism: Secondary | ICD-10-CM | POA: Diagnosis not present

## 2022-05-01 DIAGNOSIS — I4891 Unspecified atrial fibrillation: Secondary | ICD-10-CM | POA: Diagnosis not present

## 2022-05-01 DIAGNOSIS — R600 Localized edema: Secondary | ICD-10-CM | POA: Diagnosis not present

## 2022-05-01 LAB — BASIC METABOLIC PANEL
Anion gap: 6 (ref 5–15)
BUN: 27 mg/dL — ABNORMAL HIGH (ref 8–23)
CO2: 27 mmol/L (ref 22–32)
Calcium: 8.6 mg/dL — ABNORMAL LOW (ref 8.9–10.3)
Chloride: 104 mmol/L (ref 98–111)
Creatinine, Ser: 1.23 mg/dL (ref 0.61–1.24)
GFR, Estimated: >60 mL/min (ref >60)
Glucose, Bld: 99 mg/dL (ref 70–99)
Potassium: 4.6 mmol/L (ref 3.5–5.1)
Sodium: 137 mmol/L (ref 135–145)

## 2022-05-01 LAB — CBC
HCT: 31.3 % — ABNORMAL LOW (ref 39.0–52.0)
Hemoglobin: 10.6 g/dL — ABNORMAL LOW (ref 13.0–17.0)
MCH: 32.8 pg (ref 26.0–34.0)
MCHC: 33.9 g/dL (ref 30.0–36.0)
MCV: 96.9 fL (ref 80.0–100.0)
Platelets: 325 10*3/uL (ref 150–400)
RBC: 3.23 MIL/uL — ABNORMAL LOW (ref 4.22–5.81)
RDW: 12 % (ref 11.5–15.5)
WBC: 13.6 10*3/uL — ABNORMAL HIGH (ref 4.0–10.5)
nRBC: 0 % (ref 0.0–0.2)

## 2022-05-01 MED ORDER — PANTOPRAZOLE SODIUM 40 MG PO TBEC: 40.0000 mg | DELAYED_RELEASE_TABLET | Freq: Every day | ORAL | 3 refills | Status: DC

## 2022-05-01 MED ORDER — FLUCONAZOLE 200 MG PO TABS: 200.0000 mg | ORAL_TABLET | Freq: Every day | ORAL | 0 refills | Status: AC

## 2022-05-01 MED ORDER — NYSTATIN 100000 UNIT/ML MT SUSP: 5.0000 mL | Freq: Four times a day (QID) | OROMUCOSAL | 0 refills | Status: AC

## 2022-05-01 MED ORDER — PREDNISONE 20 MG PO TABS: 20.0000 mg | ORAL_TABLET | Freq: Every day | ORAL | Status: DC

## 2022-05-01 MED ORDER — DILTIAZEM HCL ER COATED BEADS 120 MG PO CP24: 120.0000 mg | ORAL_CAPSULE | Freq: Every day | ORAL | 3 refills | Status: DC

## 2022-05-01 MED ORDER — FLUCONAZOLE IN SODIUM CHLORIDE 400-0.9 MG/200ML-% IV SOLN
400.0000 mg | Freq: Once | INTRAVENOUS | Status: AC
  Administered 2022-05-01: 400 mg via INTRAVENOUS
  Filled 2022-05-01: qty 200

## 2022-05-01 MED ORDER — POLYETHYLENE GLYCOL 3350 17 G PO PACK: 17.0000 g | PACK | Freq: Every day | ORAL | 0 refills | Status: DC | PRN

## 2022-05-01 MED ORDER — FLUCONAZOLE 200 MG PO TABS: 200.0000 mg | ORAL_TABLET | Freq: Every day | ORAL | Status: DC

## 2022-05-01 MED ORDER — PREDNISONE 20 MG PO TABS: 30.0000 mg | ORAL_TABLET | Freq: Every day | ORAL | Status: DC

## 2022-05-01 NOTE — Progress Notes (Signed)
Cardiology Progress Note  Patient ID: Kenneth Weber MRN: [id number] DOB: 05-19-1950 Date of Encounter: 05/01/2022  Primary Cardiologist: None  Subjective   Chief Complaint: None.   HPI: EGD with candidiasis yesterday.  Plan is to hold anticoagulation until seen by me in the office 1 to 2 weeks.  ROS:  All other ROS reviewed and negative. Pertinent positives noted in the HPI.     Inpatient Medications  Scheduled Meds:  arformoterol  15 mcg Nebulization BID   budesonide (PULMICORT) nebulizer solution  0.5 mg Nebulization BID   diltiazem  120 mg Oral Daily   [START ON 05/02/2022] fluconazole  200 mg Oral Daily   guaiFENesin  600 mg Oral BID   melatonin  5 mg Oral QHS   nystatin  5 mL Oral QID   pantoprazole  40 mg Oral Daily   predniSONE  20 mg Oral QAC breakfast   senna-docusate  1 tablet Oral BID   sodium chloride flush  3 mL Intravenous Q12H   Continuous Infusions:  fluconazole (DIFLUCAN) IV     PRN Meds: acetaminophen **OR** acetaminophen, doxylamine (Sleep), levalbuterol, ondansetron **OR** ondansetron (ZOFRAN) IV, oxyCODONE, polyethylene glycol   Vital Signs   Vitals:   04/30/22 1357 04/30/22 2047 04/30/22 2155 05/01/22 0600  BP: 111/76  (!) 120/96 102/74  Pulse: 94     Resp: (!) 25  18 20   Temp:   (!) 97 F (36.1 C) 98.1 F (36.7 C)  TempSrc:   Axillary Oral  SpO2: 92% 93% 94% 94%  Weight:      Height:        Intake/Output Summary (Last 24 hours) at 05/01/2022 0814 Last data filed at 05/01/2022 0430 Gross per 24 hour  Intake 12.17 ml  Output 755 ml  Net -742.83 ml      04/30/2022   12:36 PM 04/28/2022    5:00 AM 04/22/2022    4:16 PM  Last 3 Weights  Weight (lbs) 181 lb 7 oz 181 lb 7 oz 187 lb  Weight (kg) 82.3 kg 82.3 kg 84.823 kg      Telemetry  Overnight telemetry shows Afib 90-110 bpm, which I personally reviewed.   Physical Exam   Vitals:   04/30/22 1357 04/30/22 2047 04/30/22 2155 05/01/22 0600  BP: 111/76  (!) 120/96 102/74   Pulse: 94     Resp: (!) 25  18 20   Temp:   (!) 97 F (36.1 C) 98.1 F (36.7 C)  TempSrc:   Axillary Oral  SpO2: 92% 93% 94% 94%  Weight:      Height:        Intake/Output Summary (Last 24 hours) at 05/01/2022 0814 Last data filed at 05/01/2022 0430 Gross per 24 hour  Intake 12.17 ml  Output 755 ml  Net -742.83 ml       04/30/2022   12:36 PM 04/28/2022    5:00 AM 04/22/2022    4:16 PM  Last 3 Weights  Weight (lbs) 181 lb 7 oz 181 lb 7 oz 187 lb  Weight (kg) 82.3 kg 82.3 kg 84.823 kg    Body mass index is 23.94 kg/m.  General: Well nourished, well developed, in no acute distress Head: Atraumatic, normal size  Eyes: PEERLA, EOMI  Neck: Supple, no JVD Endocrine: No thryomegaly Cardiac: Normal S1, S2; irregular rhythm, no murmurs Lungs: Clear to auscultation bilaterally, no wheezing, rhonchi or rales  Abd: Soft, nontender, no hepatomegaly  Ext: No edema, pulses 2+ Musculoskeletal: No deformities, BUE and BLE strength  normal and equal Skin: Warm and dry, no rashes   Neuro: Alert and oriented to person, place, time, and situation, CNII-XII grossly intact, no focal deficits  Psych: Normal mood and affect   Labs  High Sensitivity Troponin:  No results for input(s): "TROPONINIHS" in the last 720 hours.   Cardiac EnzymesNo results for input(s): "TROPONINI" in the last 168 hours. No results for input(s): "TROPIPOC" in the last 168 hours.  Chemistry Recent Labs  Lab 04/28/22 1023 04/29/22 0304 05/01/22 0252  NA 137 138 137  K 3.9 4.6 4.6  CL 100 100 104  CO2 29 28 27   GLUCOSE 153* 103* 99  BUN 28* 30* 27*  CREATININE 1.32* 1.42* 1.23  CALCIUM 8.8* 8.7* 8.6*  GFRNONAA 58* 53* >60  ANIONGAP 8 10 6     Hematology Recent Labs  Lab 04/28/22 1023 04/29/22 0304 05/01/22 0252  WBC 13.5* 15.9* 13.6*  RBC 3.66* 3.38* 3.23*  HGB 11.9* 11.0* 10.6*  HCT 35.3* 33.0* 31.3*  MCV 96.4 97.6 96.9  MCH 32.5 32.5 32.8  MCHC 33.7 33.3 33.9  RDW 11.9 12.0 12.0  PLT 379 374 325    BNP Recent Labs  Lab 04/26/22 1017  BNP 279.8*    DDimer  Recent Labs  Lab 04/26/22 1017  DDIMER 0.81*     Radiology  No results found.  Cardiac Studies  TTE 04/26/2022  1. EF challenging in the setting of atrial fibrillation . Left  ventricular ejection fraction, by estimation, is 50 to 55%. The left  ventricle has low normal function. The left ventricle demonstrates global  hypokinesis. Indeterminate diastolic filling  due to E-A fusion.   2. Right ventricular systolic function is normal. The right ventricular  size is normal. Tricuspid regurgitation signal is inadequate for assessing  PA pressure.   3. Left atrial size was mildly dilated.   4. No evidence of mitral valve regurgitation.   5. The aortic valve was not well visualized. Aortic valve regurgitation  is not visualized.   6. The inferior vena cava is normal in size with greater than 50%  respiratory variability, suggesting right atrial pressure of 3 mmHg.   Patient Profile   71 year old male with history of hypertension and COPD who was admitted for COPD exacerbation and RSV.  New onset A-fib this admission.   Assessment & Plan   # New onset A-fib -Secondary to COPD exacerbation and RSV. -Echo normal. -Thyroid studies need repeating in 4 to 6 weeks. -CHA2DS2-VASc equals 2 (hypertension, age over 74). -EGD yesterday showed esophageal candidiasis.  Plan is to hold Eliquis for 1 week.  He can hold this until seen in the office.  There is no rush to expose him to any bleeding risk.  We have plans for outpatient cardioversion but again this will require uninterrupted anticoagulation.  This will only be pursued once the bleeding risk is as low as possible.  We do appreciate input from GI. -Aspirin has no benefit for stroke protection in A-fib.  Would recommend just to start Eliquis when able.  # Hypertension -Would recommend to hold home antihypertensive agents at discharge.  Just discharged home on diltiazem  120 mg extended release as a daily dose.  [place] will sign off.   Medication Recommendations: As above Other recommendations (labs, testing, etc): None Follow up as an outpatient: He will see me in the office in 1 to 2 weeks  For questions or updates, please contact [facility] Please consult [email or web address] for contact info  under   Signed, [place] T. Audie Box, MD, [place]  05/01/2022 8:14 AM

## 2022-05-01 NOTE — Discharge Summary (Addendum)
Physician Discharge Summary   Patient: Kenneth Weber MRN: [id number] DOB: [birth year]  Admit date:     04/22/2022  Discharge date: 05/01/22  Discharge Physician: Estill Cotta, MD    PCP: [name], MD   Recommendations at discharge:   Continue Cardizem 120 mg daily Continue nystatin suspension 4 times daily for 7 days Continue Diflucan 200 mg daily for 7 days Decision regarding anticoagulation to be determined by cardiology outpatient Please repeat thyroid panel in 4 weeks.  Discharge Diagnoses:    Acute respiratory failure with hypoxia (HCC) RSV positive pneumonia   COPD with acute exacerbation (HCC)   Bilateral lower extremity edema   Atrial fibrillation with rapid ventricular response (HCC)   Melena/UGI bleed Hypertension Tobacco use disorder    Hospital Course: Patient is a 71 year old male with HTN, COPD presented from urgent care with progressive shortness of breath.  He was recently diagnosed with RSV on 12/8.  Subsequently developed productive cough and returned to urgent care on 12/18, found to be hypoxic and directed to ED for further evaluation. In ED temp 99.4 F, HR 99, RR 26, O2 sats 84% on room air, WBCs 13.9, creatinine 1.2.  Lactic acid 0.8, procalcitonin 0.24.  COVID-19 PCR negative, flu A&B negative, RSV positive Chest x-ray with small patchy infiltrates left lower lung suggestive of interstitial pneumonia.   Assessment and Plan:  Acute respiratory failure with hypoxia ([place]), RSV positive PNA -Presented with progressive shortness of breath, hypoxic in ED, O2 sats 84%, not on O2 at baseline.   -CTA negative for acute PE, venous Dopplers negative for DVT -Much improved, O2 sats 94% on room air - home O2 evaluation prior to discharge completed, does not qualify for home O2    Community-acquired pneumonia COPD exacerbation -Completed IV Rocephin  -Much improved, continue albuterol and respiratory outpatient.  Has completed IV antibiotics and steroids  while inpatient.   Atrial fibrillation with rapid ventricular response (HCC) -BNP 279.8, likely triggered by COPD, RSV pneumonia -2D echo showed EF of 50 to 55%, global hypokinesis, normal RV SF  -TSH low 0.15, free T4 normal, repeat studies in 4 weeks -CHA2DS2-VASc 2 for age, hypertension, recommended anticoagulation -Patient was placed on apixaban however he had melanotic stools hence was held.   -Underwent EGD which showed esophageal candidiasis otherwise no active bleeding, recommended to hold anticoagulation for a week. Patient will follow with Dr. Davina Poke, cardiology, in 1 week and decision for anticoagulation at that time.     Constipation, then melena/GI bleed Esophageal candidiasis -On 12/23, per patient he was constipated for 6 days and received MiraLAX, Senokot and Dulcolax.  Subsequently he started having melanotic stools.  -Eliquis was held, GI was consulted. Underwent EGD on 04/30/2022 which showed esophageal candidiasis. -Started on nystatin 4 times a day for 7 days, Diflucan 400 mg IV x 1 today and start 200 mg daily tomorrow for 1 week. -Outpatient follow-up with GI, Dr. Watt Climes     Hypertension -Stable, continue Cardizem 120 mg daily -Per cardiology, hold all other outpatient antihypertensives     Bilateral lower extremity edema Could be side effect from amlodipine, venous Dopplers negative for DVT   Tobacco use disorder -Smoking cessation        Pain control - [place] Controlled Substance Reporting System database was reviewed. and patient was instructed, not to drive, operate heavy machinery, perform activities at heights, swimming or participation in water activities or provide baby-sitting services while on Pain, Sleep and Anxiety Medications; until their outpatient Physician has advised  to do so again. Also recommended to not to take more than prescribed Pain, Sleep and Anxiety Medications.  Consultants: Cardiology, GI Procedures performed: 2D echo,  EGD Disposition: Home Diet recommendation: Soft diet  DISCHARGE MEDICATION: Allergies as of 05/01/2022       Reactions   Other    Streptogramins    Codeine Nausea And Vomiting   Lisinopril Cough   Losartan Potassium-hctz Other (See Comments)   Erectile Dysfunction        Medication List     STOP taking these medications    amLODipine 10 MG tablet Commonly known as: NORVASC   cloNIDine 0.1 MG tablet Commonly known as: CATAPRES   doxazosin 4 MG tablet Commonly known as: CARDURA       TAKE these medications    albuterol 108 (90 Base) MCG/ACT inhaler Commonly known as: VENTOLIN HFA Up to 2 puffs every 4 hours as needed What changed:  how much to take how to take this when to take this reasons to take this   Breztri Aerosphere 160-9-4.8 MCG/ACT Aero Generic drug: Budeson-Glycopyrrol-Formoterol Take 2 puffs first thing in am and then another 2 puffs about 12 hours later. What changed:  how much to take how to take this when to take this   diltiazem 120 MG 24 hr capsule Commonly known as: CARDIZEM CD Take 1 capsule (120 mg total) by mouth daily. Start taking on: May 02, 2022   Elderberry 500 MG Caps Take 1 tablet by mouth daily.   Fish Oil 1000 MG Caps Take 2 capsules by mouth daily.   fluconazole 200 MG tablet Commonly known as: DIFLUCAN Take 1 tablet (200 mg total) by mouth daily for 7 days. Start taking on: May 02, 2022   latanoprost 0.005 % ophthalmic solution Commonly known as: XALATAN Place 1 drop into both eyes at bedtime.   nystatin 100000 UNIT/ML suspension Commonly known as: MYCOSTATIN Take 5 mLs (500,000 Units total) by mouth 4 (four) times daily for 7 days.   pantoprazole 40 MG tablet Commonly known as: PROTONIX Take 1 tablet (40 mg total) by mouth daily. Start taking on: May 02, 2022   polyethylene glycol 17 g packet Commonly known as: MIRALAX / GLYCOLAX Take 17 g by mouth daily as needed for moderate  constipation.   timolol 0.5 % ophthalmic solution Commonly known as: TIMOPTIC Place 1 drop into both eyes every morning.   vitamin C 100 MG tablet Take 100 mg by mouth daily.        Follow-up Information     [name], MD Follow up.   Specialty: Family Medicine Contact information: [phone] [address] [place] [address] [phone]         Sande Rives, MD. Schedule an appointment as soon as possible for a visit in 1 week(s).   Specialties: Cardiology, Internal Medicine, Radiology Why: for hospital follow-up Contact information: [address] [place] [address] [phone]         Vida Rigger, MD. Schedule an appointment as soon as possible for a visit in 2 week(s).   Specialty: Gastroenterology Why: for hospital follow-up Contact information: [address] [place] [address] [phone]                Discharge Exam: Ceasar Mons Weights   04/22/22 1616 04/28/22 0500 04/30/22 1236  Weight: 84.8 kg 82.3 kg 82.3 kg   S: No acute complaints, O2 sats 94% on room air.  Tolerating diet.  No  acute chest pain, shortness of breath, dizziness.  Heart rate controlled  BP 102/74   Pulse 94   Temp 98.1 F (36.7 C) (Oral)   Resp 20   Ht 6\' 1"  (1.854 m)   Wt 82.3 kg   SpO2 94%   BMI 23.94 kg/m    Physical Exam General: Alert and oriented x 3, NAD Cardiovascular: Irregularly irregular Respiratory: CTAB, no wheezing Gastrointestinal: Soft, nontender, nondistended, NBS Ext: no pedal edema bilaterally Neuro: no new deficits Psych: Normal affect    Condition at discharge: fair  The results of significant diagnostics from this hospitalization (including imaging, microbiology, ancillary and laboratory) are listed below for reference.   Imaging Studies: CT Angio Chest Pulmonary Embolism (PE) W or WO Contrast  Result Date: 04/26/2022 CLINICAL DATA:  Positive D-dimer, clinical suspicion for pulmonary  embolism EXAM: CT ANGIOGRAPHY CHEST WITH CONTRAST TECHNIQUE: Multidetector CT imaging of the chest was performed using the standard protocol during bolus administration of intravenous contrast. Multiplanar CT image reconstructions and MIPs were obtained to evaluate the vascular anatomy. RADIATION DOSE REDUCTION: This exam was performed according to the departmental dose-optimization program which includes automated exposure control, adjustment of the mA and/or kV according to patient size and/or use of iterative reconstruction technique. CONTRAST:  15mL OMNIPAQUE IOHEXOL 350 MG/ML SOLN COMPARISON:  Noncontrast CT done on 04/24/2021, chest radiographs done on 04/22/2022 FINDINGS: Cardiovascular: There are no intraluminal filling defects in pulmonary artery branches. There is homogeneous enhancement in thoracic aorta. Scattered coronary artery calcifications are seen. Heart is enlarged in size. Small pericardial effusion is present. Mediastinum/Nodes: No significant lymphadenopathy is seen. Lungs/Pleura: There are patchy infiltrates in both lower lung fields, more so on the right side suggesting atelectasis/pneumonia. There is minimal right pleural effusion. There is fluid density in the lumen of bronchi in the lower lung fields suggesting secretions in the lumen. There is no pneumothorax. Upper Abdomen: There is nodularity in the adrenals with no significant interval change. There is 2.3 cm smooth marginated fluid density lesion in the anterior upper pole of left kidney suggesting renal cyst. Musculoskeletal: No acute findings are seen. Review of the MIP images confirms the above findings. IMPRESSION: There is no evidence of pulmonary artery embolism. There is no evidence of thoracic aortic dissection. Coronary artery calcifications are seen. Small pericardial effusion is present. There are patchy infiltrates in both lower lung fields, more so on the right side suggesting atelectasis/pneumonia. There is minimal right  pleural effusion. Other findings as described in the body of the report. Electronically Signed   By: Elmer Picker M.D.   On: 04/26/2022 18:21   ECHOCARDIOGRAM COMPLETE  Result Date: 04/26/2022    ECHOCARDIOGRAM REPORT   Patient Name:   Kenneth Weber Date of Exam: 04/26/2022 Medical Rec #:  [id number]      Height:       73.0 in Accession #:    [id number]     Weight:       187.0 lb Date of Birth:  1950/08/29      BSA:          2.091 m Patient Age:    83 years       BP:           117/77 mmHg Patient Gender: M              HR:           105 bpm. Exam Location:  Inpatient Procedure: 2D Echo, Color Doppler and Cardiac Doppler Indications:  I48.91* Unspecified atrial fibrillation  History:        Patient has no prior history of Echocardiogram examinations.                 COPD, Arrythmias:Atrial Fibrillation; Risk Factors:Hypertension                 and Dyslipidemia.  Sonographer:    Irving Burton Senior RDCS Referring Phys: [id number] ERIC J [place]  Sonographer Comments: Suboptimal parasternals due to lung interference, active URI. IMPRESSIONS  1. EF challenging in the setting of atrial fibrillation . Left ventricular ejection fraction, by estimation, is 50 to 55%. The left ventricle has low normal function. The left ventricle demonstrates global hypokinesis. Indeterminate diastolic filling due to E-A fusion.  2. Right ventricular systolic function is normal. The right ventricular size is normal. Tricuspid regurgitation signal is inadequate for assessing PA pressure.  3. Left atrial size was mildly dilated.  4. No evidence of mitral valve regurgitation.  5. The aortic valve was not well visualized. Aortic valve regurgitation is not visualized.  6. The inferior vena cava is normal in size with greater than 50% respiratory variability, suggesting right atrial pressure of 3 mmHg. Comparison(s): No prior Echocardiogram. FINDINGS  Left Ventricle: EF challenging in the setting of atrial fibrillation. Left ventricular  ejection fraction, by estimation, is 50 to 55%. The left ventricle has low normal function. The left ventricle demonstrates global hypokinesis. The left ventricular internal cavity size was normal in size. There is no left ventricular hypertrophy. Indeterminate diastolic filling due to E-A fusion. Right Ventricle: The right ventricular size is normal. Right ventricular systolic function is normal. Tricuspid regurgitation signal is inadequate for assessing PA pressure. Left Atrium: Left atrial size was mildly dilated. Right Atrium: Right atrial size was normal in size. Pericardium: There is no evidence of pericardial effusion. Mitral Valve: No evidence of mitral valve regurgitation. Tricuspid Valve: Tricuspid valve regurgitation is not demonstrated. Aortic Valve: The aortic valve was not well visualized. Aortic valve regurgitation is not visualized. Pulmonic Valve: Pulmonic valve regurgitation is not visualized. Aorta: The aortic root and ascending aorta are structurally normal, with no evidence of dilitation. Venous: The inferior vena cava is normal in size with greater than 50% respiratory variability, suggesting right atrial pressure of 3 mmHg. IAS/Shunts: No atrial level shunt detected by color flow Doppler.  LEFT VENTRICLE PLAX 2D LVIDd:         4.30 cm LVIDs:         3.30 cm LV PW:         0.90 cm LV IVS:        0.90 cm LVOT diam:     2.20 cm LV SV:         59 LV SV Index:   28 LVOT Area:     3.80 cm  RIGHT VENTRICLE RV S prime:     11.00 cm/s TAPSE (M-mode): 2.5 cm LEFT ATRIUM             Index        RIGHT ATRIUM           Index LA diam:        4.00 cm 1.91 cm/m   RA Area:     22.20 cm LA Vol (A2C):   78.7 ml 37.64 ml/m  RA Volume:   65.00 ml  31.08 ml/m LA Vol (A4C):   63.5 ml 30.37 ml/m LA Biplane Vol: 74.5 ml 35.63 ml/m  AORTIC VALVE LVOT Vmax:  74.40 cm/s LVOT Vmean:  50.600 cm/s LVOT VTI:    0.156 m  AORTA Ao Root diam: 3.30 cm Ao Asc diam:  3.70 cm  SHUNTS Systemic VTI:  0.16 m Systemic Diam:  2.20 cm Phineas Inches Electronically signed by Phineas Inches Signature Date/Time: 04/26/2022/10:07:43 AM    Final    VAS [place] LOWER EXTREMITY VENOUS (DVT)  Result Date: 04/24/2022  Lower Venous DVT Study Patient Name:  Kenneth Weber  Date of Exam:   04/24/2022 Medical Rec #: [id number]       Accession #:    [id number] Date of Birth: 1950-07-11       Patient Gender: M Patient Age:   72 years Exam Location:  [facility] Procedure:      VAS [place] LOWER EXTREMITY VENOUS (DVT) Referring Phys: ERIC [place] --------------------------------------------------------------------------------  Indications: Elevated D-dimer 0.97.  Comparison Study: No previous exams Performing Technologist: Jody Hill RVT, RDMS  Examination Guidelines: A complete evaluation includes B-mode imaging, spectral Doppler, color Doppler, and power Doppler as needed of all accessible portions of each vessel. Bilateral testing is considered an integral part of a complete examination. Limited examinations for reoccurring indications may be performed as noted. The reflux portion of the exam is performed with the patient in reverse Trendelenburg.  +---------+---------------+---------+-----------+----------+--------------+ RIGHT    CompressibilityPhasicitySpontaneityPropertiesThrombus Aging +---------+---------------+---------+-----------+----------+--------------+ CFV      Full           No       Yes                  pulsatile      +---------+---------------+---------+-----------+----------+--------------+ SFJ      Full                                                        +---------+---------------+---------+-----------+----------+--------------+ FV Prox  Full           Yes      Yes                                 +---------+---------------+---------+-----------+----------+--------------+ FV Mid   Full           Yes      Yes                                  +---------+---------------+---------+-----------+----------+--------------+ FV DistalFull           Yes      Yes                                 +---------+---------------+---------+-----------+----------+--------------+ PFV      Full                                                        +---------+---------------+---------+-----------+----------+--------------+ POP      Full           Yes      Yes                                 +---------+---------------+---------+-----------+----------+--------------+  PTV      Full                                                        +---------+---------------+---------+-----------+----------+--------------+ PERO     Full                                                        +---------+---------------+---------+-----------+----------+--------------+   +---------+---------------+---------+-----------+----------+--------------+ LEFT     CompressibilityPhasicitySpontaneityPropertiesThrombus Aging +---------+---------------+---------+-----------+----------+--------------+ CFV      Full           Yes      Yes                                 +---------+---------------+---------+-----------+----------+--------------+ SFJ      Full                                                        +---------+---------------+---------+-----------+----------+--------------+ FV Prox  Full           Yes      Yes                                 +---------+---------------+---------+-----------+----------+--------------+ FV Mid   Full           Yes      Yes                                 +---------+---------------+---------+-----------+----------+--------------+ FV DistalFull           Yes      Yes                                 +---------+---------------+---------+-----------+----------+--------------+ PFV      Full                                                         +---------+---------------+---------+-----------+----------+--------------+ POP      Full           Yes      Yes                                 +---------+---------------+---------+-----------+----------+--------------+ PTV      Full                                                        +---------+---------------+---------+-----------+----------+--------------+  PERO     Full                                                        +---------+---------------+---------+-----------+----------+--------------+     Summary: BILATERAL: - No evidence of deep vein thrombosis seen in the lower extremities, bilaterally. -No evidence of popliteal cyst, bilaterally.   *See table(s) above for measurements and observations. Electronically signed by Monica Martinez MD on 04/24/2022 at 12:45:20 PM.    Final    DG Chest 2 View  Result Date: 04/22/2022 CLINICAL DATA:  Shortness of breath, cough EXAM: CHEST - 2 VIEW COMPARISON:  Previous examinations including the study done earlier today FINDINGS: Cardiac size is within normal limits. Increase in AP diameter of chest suggests COPD. Small patchy infiltrate is seen in left lower lung field. This finding has not changed significantly in comparison with the immediate previous study done earlier today. This finding was not distinctly seen in an earlier examination done on 05/29/2021. In the lateral view, the infiltrate appears to be anterior, possibly within lingula. There is peribronchial thickening. There is no pleural effusion or pneumothorax. IMPRESSION: There is small patchy infiltrate in left lower lung field suggesting interstitial pneumonia. There are no new infiltrates or signs of pulmonary edema in comparison with the study done earlier today. There is no pleural effusion. Electronically Signed   By: Elmer Picker M.D.   On: 04/22/2022 17:30    Microbiology: Results for orders placed or performed during the hospital encounter of 04/22/22   Resp panel by RT-PCR (RSV, Flu A&B, Covid) Anterior Nasal Swab     Status: Abnormal   Collection Time: 04/22/22  4:51 PM   Specimen: Anterior Nasal Swab  Result Value Ref Range Status   SARS Coronavirus 2 by RT PCR NEGATIVE NEGATIVE Final    Comment: (NOTE) SARS-CoV-2 target nucleic acids are NOT DETECTED.  The SARS-CoV-2 RNA is generally detectable in upper respiratory specimens during the acute phase of infection. The lowest concentration of SARS-CoV-2 viral copies this assay can detect is 138 copies/mL. A negative result does not preclude SARS-Cov-2 infection and should not be used as the sole basis for treatment or other patient management decisions. A negative result may occur with  improper specimen collection/handling, submission of specimen other than nasopharyngeal swab, presence of viral mutation(s) within the areas targeted by this assay, and inadequate number of viral copies(<138 copies/mL). A negative result must be combined with clinical observations, patient history, and epidemiological information. The expected result is Negative.  Fact Sheet for Patients:  EntrepreneurPulse.com.au  Fact Sheet for Healthcare Providers:  IncredibleEmployment.be  This test is no t yet approved or cleared by the [place] FDA and  has been authorized for detection and/or diagnosis of SARS-CoV-2 by FDA under an Emergency Use Authorization (EUA). This EUA will remain  in effect (meaning this test can be used) for the duration of the COVID-19 declaration under Section 564(b)(1) of the Act, 21 U.S.C.section 360bbb-3(b)(1), unless the authorization is terminated  or revoked sooner.       Influenza A by PCR NEGATIVE NEGATIVE Final   Influenza B by PCR NEGATIVE NEGATIVE Final    Comment: (NOTE) The Xpert Xpress SARS-CoV-2/FLU/RSV plus assay is intended as an aid in the diagnosis of influenza from Nasopharyngeal swab specimens and should not be  used  as a sole basis for treatment. Nasal washings and aspirates are unacceptable for Xpert Xpress SARS-CoV-2/FLU/RSV testing.  Fact Sheet for Patients: EntrepreneurPulse.com.au  Fact Sheet for Healthcare Providers: IncredibleEmployment.be  This test is not yet approved or cleared by the [place] FDA and has been authorized for detection and/or diagnosis of SARS-CoV-2 by FDA under an Emergency Use Authorization (EUA). This EUA will remain in effect (meaning this test can be used) for the duration of the COVID-19 declaration under Section 564(b)(1) of the Act, 21 U.S.C. section 360bbb-3(b)(1), unless the authorization is terminated or revoked.     Resp Syncytial Virus by PCR POSITIVE (A) NEGATIVE Final    Comment: (NOTE) Fact Sheet for Patients: EntrepreneurPulse.com.au  Fact Sheet for Healthcare Providers: IncredibleEmployment.be  This test is not yet approved or cleared by the [place] FDA and has been authorized for detection and/or diagnosis of SARS-CoV-2 by FDA under an Emergency Use Authorization (EUA). This EUA will remain in effect (meaning this test can be used) for the duration of the COVID-19 declaration under Section 564(b)(1) of the Act, 21 U.S.C. section 360bbb-3(b)(1), unless the authorization is terminated or revoked.  Performed at [facility], [place] [address], [place], [place] [address]   Blood culture (routine x 2)     Status: None   Collection Time: 04/22/22 11:45 PM   Specimen: BLOOD  Result Value Ref Range Status   Specimen Description BLOOD LEFT ARM  Final   Special Requests   Final    BOTTLES DRAWN AEROBIC AND ANAEROBIC Blood Culture adequate volume   Culture   Final    NO GROWTH 5 DAYS Performed at [facility], [place] [address] [place], [place] [address]    Report Status 04/28/2022 FINAL  Final  Blood culture (routine x 2)     Status: None   Collection  Time: 04/22/22 11:48 PM   Specimen: BLOOD  Result Value Ref Range Status   Specimen Description BLOOD LEFT FOREARM  Final   Special Requests   Final    BOTTLES DRAWN AEROBIC AND ANAEROBIC Blood Culture results may not be optimal due to an excessive volume of blood received in culture bottles   Culture   Final    NO GROWTH 5 DAYS Performed at [facility], [place] [address] [place], [place] [address]    Report Status 04/28/2022 FINAL  Final  Expectorated Sputum Assessment w Gram Stain, Rflx to Resp Cult     Status: None   Collection Time: 04/23/22  2:04 AM   Specimen: Expectorated Sputum  Result Value Ref Range Status   Specimen Description EXPECTORATED SPUTUM  Final   Special Requests NONE  Final   Sputum evaluation   Final    THIS SPECIMEN IS ACCEPTABLE FOR SPUTUM CULTURE Performed at [facility], [place] [address], [place], [place] [address]    Report Status 04/23/2022 FINAL  Final  Culture, Respiratory w Gram Stain     Status: None   Collection Time: 04/23/22  2:04 AM  Result Value Ref Range Status   Specimen Description EXPECTORATED SPUTUM  Final   Special Requests NONE Reflexed from [id number]  Final   Gram Stain   Final    ABUNDANT WBC PRESENT,BOTH PMN AND MONONUCLEAR MODERATE GRAM POSITIVE COCCI IN PAIRS FEW GRAM NEGATIVE RODS    Culture   Final    MODERATE Normal respiratory flora-no Staph aureus or Pseudomonas seen Performed at [facility], [place],  [place] [address]    Report Status 04/25/2022 FINAL  Final    Labs: CBC: Recent Labs  Lab 04/25/22 0412 04/26/22 0305 04/27/22 1206 04/28/22 1023 04/29/22 0304 05/01/22 0252  WBC 18.5* 15.3*  --  13.5* 15.9* 13.6*  HGB 9.5* 9.5* 11.3* 11.9* 11.0* 10.6*  HCT 28.0* 28.9* 35.3* 35.3* 33.0* 31.3*  MCV 96.9 98.6  --  96.4 97.6 96.9  PLT 284 297  --  379 374 [id number]   Basic Metabolic Panel: Recent Labs  Lab 04/25/22 0412 04/26/22 0305 04/28/22 1023 04/29/22 0304 05/01/22 0252  NA 142  142 137 138 137  K 3.8 4.7 3.9 4.6 4.6  CL 106 107 100 100 104  CO2 28 29 29 28 27   GLUCOSE 107* 100* 153* 103* 99  BUN 46* 35* 28* 30* 27*  CREATININE 1.22 1.27* 1.32* 1.42* 1.23  CALCIUM 8.6* 8.8* 8.8* 8.7* 8.6*  MG  --  2.2  --   --   --    Liver Function Tests: No results for input(s): "AST", "ALT", "ALKPHOS", "BILITOT", "PROT", "ALBUMIN" in the last 168 hours. CBG: No results for input(s): "GLUCAP" in the last 168 hours.  Discharge time spent: greater than 30 minutes.  Signed: Estill Cotta, MD [facility] 05/01/2022

## 2022-05-01 NOTE — Progress Notes (Signed)
Kenneth Weber 9:31 AM  Subjective: Patient without signs of bleeding no GI complaints and we rediscussed his procedure and answered all of his questions and case discussed through epic chat with cardiology and hospital team  Objective: Signs stable afebrile no acute distress abdomen is soft nontender BUN and creatinine slight decrease in globin stable  Assessment: GI bleeding on Eliquis  Plan: Will hold Eliquis for 1 week and follow-up with cardiology in the office and if rebleeding on that happy to complete his workup with probable capsule endoscopy and possible repeat colonoscopy and okay with me to go home in the meantime and the warnings of what to watch for were discussed  [facility]  office [phone] After 5PM or if no answer call [phone]

## 2022-05-01 NOTE — Progress Notes (Signed)
Patient ambulated approximately 122ft on room air, maintained O2 sats at 91-94%. Mild SOB on exertion but recovered on room air.

## 2022-05-02 ENCOUNTER — Encounter (HOSPITAL_COMMUNITY): Payer: Self-pay | Admitting: Gastroenterology

## 2022-05-07 DIAGNOSIS — I10 Essential (primary) hypertension: Secondary | ICD-10-CM | POA: Diagnosis not present

## 2022-05-07 DIAGNOSIS — R946 Abnormal results of thyroid function studies: Secondary | ICD-10-CM | POA: Diagnosis not present

## 2022-05-07 DIAGNOSIS — J449 Chronic obstructive pulmonary disease, unspecified: Secondary | ICD-10-CM | POA: Diagnosis not present

## 2022-05-07 DIAGNOSIS — I4891 Unspecified atrial fibrillation: Secondary | ICD-10-CM | POA: Diagnosis not present

## 2022-05-07 DIAGNOSIS — F17290 Nicotine dependence, other tobacco product, uncomplicated: Secondary | ICD-10-CM | POA: Diagnosis not present

## 2022-05-07 DIAGNOSIS — B3781 Candidal esophagitis: Secondary | ICD-10-CM | POA: Diagnosis not present

## 2022-05-09 DIAGNOSIS — L821 Other seborrheic keratosis: Secondary | ICD-10-CM | POA: Diagnosis not present

## 2022-05-12 NOTE — Progress Notes (Unsigned)
Cardiology Office Note:   Date:  05/13/2022  NAME:  Kenneth Weber    MRN: [id number] DOB:  [birth year]   PCP:  [name], MD  Cardiologist:  None  Electrophysiologist:  None   Referring MD: [name], MD   Chief Complaint  Patient presents with   Atrial Fibrillation    History of Present Illness:   Kenneth Weber is a 72 y.o. male with a hx of persistent Afib, HTN, COPD who is being seen today for the evaluation of atrial fibrillation at the request of [name], MD. Admitted in late December with RSV/COPD/PNA. In Afib that was rate controlled. Had GI bleed due to [facility] and esophageal candidiasis. AC was instructed to be held for 7 days and then follow-up as outpatient to discuss further.   EKG shows he is back in normal rhythm.  He has been treated for RSV pneumonia as well as COPD exacerbation.  It is not surprising he has gone back to normal rhythm.  We discussed starting Eliquis.  This was held due to GI bleed in the setting of esophageal candidiasis.  He will see GI in the next few weeks.  He is without symptoms.  Reports no recurrence of A-fib.  We discussed a formal monitor.  He also had abnormal thyroid studies in the hospital.  These will be checked by his primary care physician in the next few weeks.  He is okay to start Eliquis.  Denies chest pain or trouble breathing.  He is walking.  He is doing activity around the house.  His echo was normal in the hospital.  Overall he seems to be doing well.  Problem List Persistent Afib -Dx 04/28/2022 -> RSV/COPD exacerbation  -CHADSVASC=2 (age, HTN) 2. HTN 3. COPD 4. Esophageal candidiasis 04/30/2022  Past Medical History: Past Medical History:  Diagnosis Date   Allergic rhinitis    Chest pain    COPD (chronic obstructive pulmonary disease) (HCC)    ED (erectile dysfunction)    Hyperlipidemia    Hypertension    SOB (shortness of breath)     Past Surgical History: Past Surgical History:  Procedure Laterality Date    ESOPHAGOGASTRODUODENOSCOPY N/A 04/30/2022   Procedure: ESOPHAGOGASTRODUODENOSCOPY (EGD);  Surgeon: Clarene Essex, MD;  Location: [facility];  Service: Gastroenterology;  Laterality: N/A;    Current Medications: Current Meds  Medication Sig   albuterol (VENTOLIN HFA) 108 (90 Base) MCG/ACT inhaler Up to 2 puffs every 4 hours as needed (Patient taking differently: Inhale 1-2 puffs into the lungs every 6 (six) hours as needed for shortness of breath. Up to 2 puffs every 4 hours as needed)   apixaban (ELIQUIS) 5 MG TABS tablet Take 1 tablet (5 mg total) by mouth 2 (two) times daily.   Ascorbic Acid (VITAMIN C) 100 MG tablet Take 100 mg by mouth daily.   Budeson-Glycopyrrol-Formoterol (BREZTRI AEROSPHERE) 160-9-4.8 MCG/ACT AERO Take 2 puffs first thing in am and then another 2 puffs about 12 hours later. (Patient taking differently: Inhale 2 puffs into the lungs 2 (two) times daily. Take 2 puffs first thing in am and then another 2 puffs about 12 hours later.)   Elderberry 500 MG CAPS Take 1 tablet by mouth daily.   latanoprost (XALATAN) 0.005 % ophthalmic solution Place 1 drop into both eyes at bedtime.   Omega-3 Fatty Acids (FISH OIL) 1000 MG CAPS Take 2 capsules by mouth daily.   pantoprazole (PROTONIX) 40 MG tablet Take 1 tablet (40 mg total) by mouth daily.  timolol (TIMOPTIC) 0.5 % ophthalmic solution Place 1 drop into both eyes every morning.   [DISCONTINUED] diltiazem (CARDIZEM CD) 120 MG 24 hr capsule Take 1 capsule (120 mg total) by mouth daily.     Allergies:    Other, Streptogramins, Codeine, Lisinopril, and Losartan potassium-hctz   Social History: Social History   Socioeconomic History   Marital status: Married    Spouse name: Not on file   Number of children: Not on file   Years of education: Not on file   Highest education level: Not on file  Occupational History   Not on file  Tobacco Use   Smoking status: Former    Packs/day: 1.50    Years: 40.00    Total pack years:  60.00    Types: Cigarettes    Quit date: 09/13/2013    Years since quitting: 8.6   Smokeless tobacco: Never   Tobacco comments:    Patient reports cigars at times but trying to quit, 12/01/2020.   Vaping Use   Vaping Use: Never used  Substance and Sexual Activity   Alcohol use: No   Drug use: No   Sexual activity: Not on file  Other Topics Concern   Not on file  Social History Narrative   Not on file   Social Determinants of Health   Financial Resource Strain: Not on file  Food Insecurity: No Food Insecurity (04/28/2022)   Hunger Vital Sign    Worried About Running Out of Food in the Last Year: Never true    Ran Out of Food in the Last Year: Never true  Transportation Needs: No Transportation Needs (04/28/2022)   PRAPARE - Administrator, Civil Service (Medical): No    Lack of Transportation (Non-Medical): No  Physical Activity: Not on file  Stress: Not on file  Social Connections: Not on file     Family History: The patient's family history is not on file.  ROS:   All other ROS reviewed and negative. Pertinent positives noted in the HPI.     EKGs/Labs/Other Studies Reviewed:   The following studies were personally reviewed by me today:  EKG:  EKG is ordered today.  The ekg ordered today demonstrates NSR 60 bpm, and was personally reviewed by me.   TTE 04/26/2022  1. EF challenging in the setting of atrial fibrillation . Left  ventricular ejection fraction, by estimation, is 50 to 55%. The left  ventricle has low normal function. The left ventricle demonstrates global  hypokinesis. Indeterminate diastolic filling  due to E-A fusion.   2. Right ventricular systolic function is normal. The right ventricular  size is normal. Tricuspid regurgitation signal is inadequate for assessing  PA pressure.   3. Left atrial size was mildly dilated.   4. No evidence of mitral valve regurgitation.   5. The aortic valve was not well visualized. Aortic valve  regurgitation  is not visualized.   6. The inferior vena cava is normal in size with greater than 50%  respiratory variability, suggesting right atrial pressure of 3 mmHg.   Recent Labs: 04/22/2022: ALT 17 04/25/2022: TSH 0.158 04/26/2022: B Natriuretic Peptide 279.8; Magnesium 2.2 05/01/2022: BUN 27; Creatinine, Ser 1.23; Hemoglobin 10.6; Platelets 325; Potassium 4.6; Sodium 137   Recent Lipid Panel No results found for: "CHOL", "TRIG", "HDL", "CHOLHDL", "VLDL", "LDLCALC", "LDLDIRECT"  Physical Exam:   VS:  BP 128/60   Pulse 60   Ht 6\' 1"  (1.854 m)   Wt 181 lb (82.1 kg)  SpO2 97%   BMI 23.88 kg/m    Wt Readings from Last 3 Encounters:  05/13/22 181 lb (82.1 kg)  04/30/22 181 lb 7 oz (82.3 kg)  12/31/21 188 lb (85.3 kg)    General: Well nourished, well developed, in no acute distress Head: Atraumatic, normal size  Eyes: PEERLA, EOMI  Neck: Supple, no JVD Endocrine: No thryomegaly Cardiac: Normal S1, S2; RRR; no murmurs, rubs, or gallops Lungs: Clear to auscultation bilaterally, no wheezing, rhonchi or rales  Abd: Soft, nontender, no hepatomegaly  Ext: No edema, pulses 2+ Musculoskeletal: No deformities, BUE and BLE strength normal and equal Skin: Warm and dry, no rashes   Neuro: Alert and oriented to person, place, time, and situation, CNII-XII grossly intact, no focal deficits  Psych: Normal mood and affect   ASSESSMENT:   Kishaun Erekson is a 72 y.o. male who presents for the following: 1. Persistent atrial fibrillation (HCC)   2. Acquired thrombophilia (HCC)   3. Primary hypertension     PLAN:   1. Persistent atrial fibrillation (HCC) 2. Acquired thrombophilia (HCC) 3. Primary hypertension -Recent diagnosis of atrial fibrillation in the setting of RSV pneumonia as well as COPD exacerbation.  Rate control strategy was pursued.  He is now back in normal rhythm.  We will proceed with a 7-day Zio to exclude any underlying A-fib.  We also discussed the Kardia mobile  device.  I think this will be good for him to monitor his rhythm after his formal monitor.  We will see if he has recurrence of A-fib. -Thyroid studies were abnormal in the hospital.  I encouraged him to follow-up this as an outpatient.  He will see his primary care physician regarding further workup. -His echo was normal in the hospital. -No symptoms of angina.  Overall, I suspect this is secondary to A-fib in the setting of pulmonary illness. -His CHA2DS2-VASc is 2.  Anticoagulation is indicated.  We will start him on Eliquis 5 mg twice daily.  Eliquis was delayed due to esophageal candidiasis and GI bleed.  GI did recommend 7 days off anticoagulation.  He is more than 7 days from this recommendation.  We will start him on this.  He will let us know if he has any bleeding.  He will follow-up with GI regarding further management of esophageal candidiasis. -We will plan to see him back in 6 months pending his monitor and any recurrence of atrial fibrillation.      Disposition: Return in about 6 months (around 11/11/2022).  Medication Adjustments/Labs and Tests Ordered: Current medicines are reviewed at length with the patient today.  Concerns regarding medicines are outlined above.  Orders Placed This Encounter  Procedures   LONG TERM MONITOR (3-14 DAYS)   EKG 12-Lead   Meds ordered this encounter  Medications   apixaban (ELIQUIS) 5 MG TABS tablet    Sig: Take 1 tablet (5 mg total) by mouth 2 (two) times daily.    Dispense:  60 tablet    Refill:  3   diltiazem (CARDIZEM CD) 120 MG 24 hr capsule    Sig: Take 1 capsule (120 mg total) by mouth daily.    Dispense:  30 capsule    Refill:  3    Patient Instructions  Medication Instructions:  START Eliquis 5 mg twice daily   The current medical regimen is effective;  continue present plan and medications.  *If you need a refill on your cardiac medications before your next appointment, please call your  pharmacy*   Testing/Procedures:  [facility] Monitor Instructions  Your physician has requested you wear a ZIO patch monitor for 7 days.  This is a single patch monitor. [employer] supplies one patch monitor per enrollment. Additional stickers are not available. Please do not apply patch if you will be having a Nuclear Stress Test,  Echocardiogram, Cardiac CT, MRI, or Chest Xray during the period you would be wearing the  monitor. The patch cannot be worn during these tests. You cannot remove and re-apply the  ZIO XT patch monitor.  Your ZIO patch monitor will be mailed 3 day USPS to your address on file. It may take 3-5 days  to receive your monitor after you have been enrolled.  Once you have received your monitor, please review the enclosed instructions. Your monitor  has already been registered assigning a specific monitor serial # to you.  Billing and Patient Assistance Program Information  We have supplied [employer] with any of your insurance information on file for billing purposes. [employer] offers a sliding scale Patient Assistance Program for patients that do not have  insurance, or whose insurance does not completely cover the cost of the ZIO monitor.  You must apply for the Patient Assistance Program to qualify for this discounted rate.  To apply, please call [employer] at [phone], select option 4, select option 2, ask to apply for  Patient Assistance Program. Theodore Demark will ask your household income, and how many people  are in your household. They will quote your out-of-pocket cost based on that information.  [employer] will also be able to set up a 11-month, interest-free payment plan if needed.  Applying the monitor   Shave hair from upper left chest.  Hold abrader disc by orange tab. Rub abrader in 40 strokes over the upper left chest as  indicated in your monitor instructions.  Clean area with 4 enclosed alcohol pads. Let dry.  Apply patch as indicated in monitor  instructions. Patch will be placed under collarbone on left  side of chest with arrow pointing upward.  Rub patch adhesive wings for 2 minutes. Remove white label marked "1". Remove the white  label marked "2". Rub patch adhesive wings for 2 additional minutes.  While looking in a mirror, press and release button in center of patch. A small green light will  flash 3-4 times. This will be your only indicator that the monitor has been turned on.  Do not shower for the first 24 hours. You may shower after the first 24 hours.  Press the button if you feel a symptom. You will hear a small click. Record Date, Time and  Symptom in the Patient Logbook.  When you are ready to remove the patch, follow instructions on the last 2 pages of Patient  Logbook. Stick patch monitor onto the last page of Patient Logbook.  Place Patient Logbook in the blue and white box. Use locking tab on box and tape box closed  securely. The blue and white box has prepaid postage on it. Please place it in the mailbox as  soon as possible. Your physician should have your test results approximately 7 days after the  monitor has been mailed back to [facility].  Call [place] at [phone] if you have questions regarding  your ZIO XT patch monitor. Call them immediately if you see an orange light blinking on your  monitor.  If your monitor falls off in less than 4 days, contact our Monitor department  at [phone].  If your monitor becomes loose or falls off after 4 days call [employer] at [phone] for  suggestions on securing your monitor    Follow-Up: At [facility], you and your health needs are our priority.  As part of our continuing mission to provide you with exceptional heart care, we have created designated Provider Care Teams.  These Care Teams include your primary Cardiologist (physician) and Advanced Practice Providers (APPs -  Physician Assistants and Nurse Practitioners)  who all work together to provide you with the care you need, when you need it.  We recommend signing up for the patient portal called "MyChart".  Sign up information is provided on this After Visit Summary.  MyChart is used to connect with patients for Virtual Visits (Telemedicine).  Patients are able to view lab/test results, encounter notes, upcoming appointments, etc.  Non-urgent messages can be sent to your provider as well.   To learn more about what you can do with MyChart, go to [email or web address].    Your next appointment:   6 month(s)  The format for your next appointment:   In Person  Provider:   Eleonore Chiquito, MD    [place] with Patient: I have spent a total of 35 minutes with patient reviewing hospital notes, telemetry, EKGs, labs and examining the patient as well as establishing an assessment and plan that was discussed with the patient.  > 50% of time was spent in direct patient care.  Signed, Addison Naegeli. Audie Box, MD, [facility]  [address] [place], [place] [address] [phone]  05/13/2022 5:54 PM

## 2022-05-13 ENCOUNTER — Ambulatory Visit (INDEPENDENT_AMBULATORY_CARE_PROVIDER_SITE_OTHER): Payer: Medicare Other

## 2022-05-13 ENCOUNTER — Encounter: Payer: Self-pay | Admitting: Cardiovascular Disease

## 2022-05-13 ENCOUNTER — Ambulatory Visit: Payer: Medicare Other | Attending: Cardiovascular Disease | Admitting: Cardiovascular Disease

## 2022-05-13 VITALS — BP 128/60 | HR 60 | Ht 73.0 in | Wt 181.0 lb

## 2022-05-13 DIAGNOSIS — I10 Essential (primary) hypertension: Secondary | ICD-10-CM

## 2022-05-13 DIAGNOSIS — I4819 Other persistent atrial fibrillation: Secondary | ICD-10-CM

## 2022-05-13 DIAGNOSIS — D6869 Other thrombophilia: Secondary | ICD-10-CM | POA: Diagnosis not present

## 2022-05-13 MED ORDER — DILTIAZEM HCL ER COATED BEADS 120 MG PO CP24: 120.0000 mg | ORAL_CAPSULE | Freq: Every day | ORAL | 3 refills | Status: DC

## 2022-05-13 MED ORDER — APIXABAN 5 MG PO TABS: 5.0000 mg | ORAL_TABLET | Freq: Two times a day (BID) | ORAL | 3 refills | Status: DC

## 2022-05-13 NOTE — Patient Instructions (Signed)
Medication Instructions:  START Eliquis 5 mg twice daily   The current medical regimen is effective;  continue present plan and medications.  *If you need a refill on your cardiac medications before your next appointment, please call your pharmacy*   Testing/Procedures:  [place] Monitor Instructions  Your physician has requested you wear a ZIO patch monitor for 7 days.  This is a single patch monitor. [employer] supplies one patch monitor per enrollment. Additional stickers are not available. Please do not apply patch if you will be having a Nuclear Stress Test,  Echocardiogram, Cardiac CT, MRI, or Chest Xray during the period you would be wearing the  monitor. The patch cannot be worn during these tests. You cannot remove and re-apply the  ZIO XT patch monitor.  Your ZIO patch monitor will be mailed 3 day USPS to your address on file. It may take 3-5 days  to receive your monitor after you have been enrolled.  Once you have received your monitor, please review the enclosed instructions. Your monitor  has already been registered assigning a specific monitor serial # to you.  Billing and Patient Assistance Program Information  We have supplied [employer] with any of your insurance information on file for billing purposes. [employer] offers a sliding scale Patient Assistance Program for patients that do not have  insurance, or whose insurance does not completely cover the cost of the ZIO monitor.  You must apply for the Patient Assistance Program to qualify for this discounted rate.  To apply, please call [employer] at [phone], select option 4, select option 2, ask to apply for  Patient Assistance Program. Theodore Demark will ask your household income, and how many people  are in your household. They will quote your out-of-pocket cost based on that information.  [employer] will also be able to set up a 48-month, interest-free payment plan if needed.  Applying the monitor   Shave hair from  upper left chest.  Hold abrader disc by orange tab. Rub abrader in 40 strokes over the upper left chest as  indicated in your monitor instructions.  Clean area with 4 enclosed alcohol pads. Let dry.  Apply patch as indicated in monitor instructions. Patch will be placed under collarbone on left  side of chest with arrow pointing upward.  Rub patch adhesive wings for 2 minutes. Remove white label marked "1". Remove the white  label marked "2". Rub patch adhesive wings for 2 additional minutes.  While looking in a mirror, press and release button in center of patch. A small green light will  flash 3-4 times. This will be your only indicator that the monitor has been turned on.  Do not shower for the first 24 hours. You may shower after the first 24 hours.  Press the button if you feel a symptom. You will hear a small click. Record Date, Time and  Symptom in the Patient Logbook.  When you are ready to remove the patch, follow instructions on the last 2 pages of Patient  Logbook. Stick patch monitor onto the last page of Patient Logbook.  Place Patient Logbook in the blue and white box. Use locking tab on box and tape box closed  securely. The blue and white box has prepaid postage on it. Please place it in the mailbox as  soon as possible. Your physician should have your test results approximately 7 days after the  monitor has been mailed back to [facility].  Call [place] at [phone]  if you have questions regarding  your ZIO XT patch monitor. Call them immediately if you see an orange light blinking on your  monitor.  If your monitor falls off in less than 4 days, contact our Monitor department at [phone].  If your monitor becomes loose or falls off after 4 days call [employer] at [phone] for  suggestions on securing your monitor    Follow-Up: At [facility], you and your health needs are our priority.  As part of our continuing mission to  provide you with exceptional heart care, we have created designated Provider Care Teams.  These Care Teams include your primary Cardiologist (physician) and Advanced Practice Providers (APPs -  Physician Assistants and Nurse Practitioners) who all work together to provide you with the care you need, when you need it.  We recommend signing up for the patient portal called "MyChart".  Sign up information is provided on this After Visit Summary.  MyChart is used to connect with patients for Virtual Visits (Telemedicine).  Patients are able to view lab/test results, encounter notes, upcoming appointments, etc.  Non-urgent messages can be sent to your provider as well.   To learn more about what you can do with MyChart, go to [email or web address].    Your next appointment:   6 month(s)  The format for your next appointment:   In Person  Provider:   Lennie Odor, MD    [facility]*

## 2022-05-13 NOTE — Progress Notes (Unsigned)
Enrolled for [employer] to mail a ZIO XT long term holter monitor to the patients address on file.  

## 2022-05-16 DIAGNOSIS — D500 Iron deficiency anemia secondary to blood loss (chronic): Secondary | ICD-10-CM | POA: Diagnosis not present

## 2022-05-16 DIAGNOSIS — K921 Melena: Secondary | ICD-10-CM | POA: Diagnosis not present

## 2022-05-17 DIAGNOSIS — I4819 Other persistent atrial fibrillation: Secondary | ICD-10-CM

## 2022-05-28 DIAGNOSIS — D500 Iron deficiency anemia secondary to blood loss (chronic): Secondary | ICD-10-CM | POA: Diagnosis not present

## 2022-05-28 DIAGNOSIS — K921 Melena: Secondary | ICD-10-CM | POA: Diagnosis not present

## 2022-05-28 DIAGNOSIS — R946 Abnormal results of thyroid function studies: Secondary | ICD-10-CM | POA: Diagnosis not present

## 2022-05-29 ENCOUNTER — Telehealth: Payer: Self-pay | Admitting: Cardiovascular Disease

## 2022-05-29 DIAGNOSIS — I4819 Other persistent atrial fibrillation: Secondary | ICD-10-CM | POA: Diagnosis not present

## 2022-05-29 NOTE — Telephone Encounter (Signed)
Attempted to contact wife, unable to reach.   LVM to call back.  Left call back number.

## 2022-05-29 NOTE — Telephone Encounter (Signed)
New Message:      Patient's wife wants to know if you have received any paper work from [employer] for patient's Eliquis?

## 2022-05-29 NOTE — Telephone Encounter (Signed)
Wife returned call. Advised that she had their portion of the patient assistance and they would bring them by, I can have Dr.O'Neal sign his part and send it over all at once.   Patient verbalized understanding, will bring their part to the office.

## 2022-06-08 ENCOUNTER — Telehealth: Payer: Self-pay | Admitting: Internal Medicine

## 2022-06-08 ENCOUNTER — Telehealth: Payer: Self-pay | Admitting: Cardiology

## 2022-06-08 NOTE — Telephone Encounter (Signed)
Contacted patient after he called our after hours lines to discuss his elevated blood pressure.  Patient discussed the same matter with the on call NP earlier today.  Patient was informed to take an extra dose of diltiazem for systolic Bps between 233-007 and diastolic Bps between 62-263.  Patient reports  potentially feeling a little light headed.  I informed him that he can wait and allow the extra dose of diltiazem to kick in with follow up in clinic this upcoming week or be seen at his local emergency department.  Patient expressed understanding.

## 2022-06-08 NOTE — Telephone Encounter (Addendum)
Received outpatient call regarding patient concerns with BP. Attempted CB x2, VM left.   Patient called back.  Reports that he has had elevated blood pressure readings for the past 2 days.  Blood pressure readings as follows: 188/104, heart rate 74 163/85, heart rate 85 179/96, heart rate 104  He has already taken his diltiazem 120 mg daily as well as Eliquis 5 mg.  He is asymptomatic.  Reports while he was hospitalized with respiratory virus was when he was initially diagnosed with atrial fibrillation.  He was asymptomatic with his A-fib at that time.  He reports his diet has been poor the past couple of days. Eating wings, increased salt. I advised he take an extra diltiazem tablet today and follow-up with his heart rate and blood pressure.  Did advise he may be back in A-fib given his heart rate variability.  Instructed I would route the message to the clinic to arrange for visit first of the week.  Instructed to remain on his Eliquis without missing any doses.  He voiced understanding and thanked me for call back.

## 2022-06-10 NOTE — Progress Notes (Unsigned)
Cardiology Clinic Note   Patient Name: Kenneth Weber Date of Encounter: 06/11/2022  Primary Care Provider:  [name], MD Primary Cardiologist:  Reatha Harps, MD  Patient Profile    Kenneth Weber is a 72 y.o. male with a past medical history of PAF, bilateral lower extremity edema, hypertension, COPD who presents to the clinic today for elevated blood pressure.  Past Medical History    Past Medical History:  Diagnosis Date   Allergic rhinitis    Chest pain    COPD (chronic obstructive pulmonary disease) (HCC)    ED (erectile dysfunction)    Hyperlipidemia    Hypertension    SOB (shortness of breath)    Past Surgical History:  Procedure Laterality Date   ESOPHAGOGASTRODUODENOSCOPY N/A 04/30/2022   Procedure: ESOPHAGOGASTRODUODENOSCOPY (EGD);  Surgeon: Vida Rigger, MD;  Location: [facility] ENDOSCOPY;  Service: Gastroenterology;  Laterality: N/A;    Allergies  Allergies  Allergen Reactions   Other    Streptogramins    Codeine Nausea And Vomiting   Lisinopril Cough   Losartan Potassium-Hctz Other (See Comments)    Erectile Dysfunction    History of Present Illness    Kenneth Weber has a past medical history of: PAF. Echo 04/26/2022: EF 50 to 55%.  Global hypokinesis.  Mild LAE. Bilateral lower extremity edema. Hypertension. COPD.  Kenneth Weber was first evaluated by Dr. Flora Lipps for A-fib on 04/28/2022 during hospital admission for acute hypoxic respiratory failure secondary to pneumonia.  Patient presented to the emergency department from urgent care for increased shortness of breath and diagnosis of pneumonia.  Patient had been diagnosed with RSV 9 days prior to that.  Patient was noted to be in A-fib with RVR on 04/26/2022.  He was started on p.o. Cardizem and Eliquis.  Patient was evaluated by Dr. Flora Lipps on 04/28/2022 his rate was controlled with diltiazem and metoprolol.  Secondary to BP concerns.  Patient was also found to have dark tarry stools and positive  occult blood testing so Eliquis was held.  Patient was awaiting GI evaluation.  Patient was discharged on 05/01/2022 and instructed to continue holding Eliquis until outpatient cardiology appointment. Patient was found to have esophageal candidiasis per GI workup.  Patient was seen in the office by Dr. Flora Lipps on 05/13/2022.  He was back in sinus rhythm at that time.  Given CHA2DS2-VASc score of 2 he was restarted on Eliquis.  7-day ZIO was ordered.  Patient called the office on 06/08/2022 to report elevated blood pressure readings x 2 days. He reported eating wings and other high sodium items. Last 3 help BP readings:  188/104, heart rate 74. 163/85, heart rate 85. 179/96, heart rate 104. Patient was instructed to take an extra diltiazem and appointment was made for follow-up.   Today, patient comes in alone. He has his home BP cuff with him to calibrate. He has continued to have high BP readings. He did have a headache twice over the weekend that is described as mild and resolved with Tylenol.  No dizziness or vision changes.  He reports he felt like something was "off" and he was more tired than usual.  He is very active and continues to work full-time.  He admits dietary indiscretion.  He typically eats foods high in sodium such as ham, [place], canned soups and beans, and a lot of candy.  He states he has always had a hard time regulating his blood pressure.  His PCP was following his his blood pressure prior to his  hospitalization and it was very labile.  He states he went out of medication and he would have episodes of hypotension so that the medicine would be taken away and he would have episodes of hypertension.  When he was in the hospital he reports they stopped all medications and discharged him on only diltiazem.  He is otherwise doing well.  He denies shortness of breath or dyspnea on exertion. No chest pain, pressure, or tightness. Denies lower extremity edema, orthopnea, or PND. No palpitations.   No further dark stool or other bleeding issues.    Home Medications    Current Meds  Medication Sig   albuterol (VENTOLIN HFA) 108 (90 Base) MCG/ACT inhaler Up to 2 puffs every 4 hours as needed (Patient taking differently: Inhale 1-2 puffs into the lungs every 6 (six) hours as needed for shortness of breath. Up to 2 puffs every 4 hours as needed)   apixaban (ELIQUIS) 5 MG TABS tablet Take 1 tablet (5 mg total) by mouth 2 (two) times daily.   Ascorbic Acid (VITAMIN C) 100 MG tablet Take 100 mg by mouth daily.   Budeson-Glycopyrrol-Formoterol (BREZTRI AEROSPHERE) 160-9-4.8 MCG/ACT AERO Take 2 puffs first thing in am and then another 2 puffs about 12 hours later. (Patient taking differently: Inhale 2 puffs into the lungs 2 (two) times daily. Take 2 puffs first thing in am and then another 2 puffs about 12 hours later.)   diltiazem (CARDIZEM CD) 120 MG 24 hr capsule Take 1 capsule (120 mg total) by mouth daily.   Elderberry 500 MG CAPS Take 1 tablet by mouth daily.   latanoprost (XALATAN) 0.005 % ophthalmic solution Place 1 drop into both eyes at bedtime.   olmesartan (BENICAR) 40 MG tablet Take 1 tablet (40 mg total) by mouth daily.   Omega-3 Fatty Acids (FISH OIL) 1000 MG CAPS Take 2 capsules by mouth daily.   pantoprazole (PROTONIX) 40 MG tablet Take 1 tablet (40 mg total) by mouth daily.   timolol (TIMOPTIC) 0.5 % ophthalmic solution Place 1 drop into both eyes every morning.    Family History    History reviewed. No pertinent family history. has no family status information on file.    Social History    Social History   Socioeconomic History   Marital status: Married    Spouse name: Not on file   Number of children: Not on file   Years of education: Not on file   Highest education level: Not on file  Occupational History   Not on file  Tobacco Use   Smoking status: Former    Packs/day: 1.50    Years: 40.00    Total pack years: 60.00    Types: Cigarettes    Quit date:  09/13/2013    Years since quitting: 8.7   Smokeless tobacco: Never   Tobacco comments:    Patient reports cigars at times but trying to quit, 12/01/2020.   Vaping Use   Vaping Use: Never used  Substance and Sexual Activity   Alcohol use: No   Drug use: No   Sexual activity: Not on file  Other Topics Concern   Not on file  Social History Narrative   Not on file   Social Determinants of Health   Financial Resource Strain: Not on file  Food Insecurity: No Food Insecurity (04/28/2022)   Hunger Vital Sign    Worried About Running Out of Food in the Last Year: Never true    Ran Out of Food in  the Last Year: Never true  Transportation Needs: No Transportation Needs (04/28/2022)   PRAPARE - Hydrologist (Medical): No    Lack of Transportation (Non-Medical): No  Physical Activity: Not on file  Stress: Not on file  Social Connections: Not on file  Intimate Partner Violence: Not At Risk (04/28/2022)   Humiliation, Afraid, Rape, and Kick questionnaire    Fear of Current or Ex-Partner: No    Emotionally Abused: No    Physically Abused: No    Sexually Abused: No     Review of Systems    General:  No chills, fever, night sweats or weight changes.  Elevated BP. Cardiovascular:  No chest pain, dyspnea on exertion, edema, orthopnea, palpitations, paroxysmal nocturnal dyspnea. Dermatological: No rash, lesions/masses Respiratory: No cough, dyspnea Urologic: No hematuria, dysuria Abdominal:   No nausea, vomiting, diarrhea, bright red blood per rectum, melena, or hematemesis Neurologic:  No visual changes, weakness, changes in mental status. All other systems reviewed and are otherwise negative except as noted above.  Physical Exam    VS:  BP (!) 186/90 (BP Location: Left Arm, Patient Position: Sitting, Cuff Size: Normal)   Pulse (!) 50   Ht 6\' 1"  (1.854 m)   Wt 187 lb (84.8 kg)   SpO2 96%   BMI 24.67 kg/m  , BMI Body mass index is 24.67 kg/m. GEN:   Well nourished, well developed, in no acute distress. HEENT: Normal. Neck: Supple, no JVD, carotid bruits, or masses. Cardiac: RRR, no murmurs, rubs, or gallops. No clubbing, cyanosis, edema.  Radials/DP/PT 2+ and equal bilaterally.  Respiratory:  Respirations regular and unlabored, clear to auscultation bilaterally. GI: Soft, nontender, nondistended. MS: No deformity or atrophy. Skin: Warm and dry, no rash. Neuro: Strength and sensation are intact. Psych: Normal affect.  Accessory Clinical Findings    Recent Labs: 04/22/2022: ALT 17 04/25/2022: TSH 0.158 04/26/2022: B Natriuretic Peptide 279.8; Magnesium 2.2 05/01/2022: BUN 27; Creatinine, Ser 1.23; Hemoglobin 10.6; Platelets 325; Potassium 4.6; Sodium 137   Recent Lipid Panel Lipid panel per [facility] 09/04/2021: LDL 74, HDL 58, TG 108, total 151. HYPERTENSION CONTROL Vitals:   06/11/22 0914 06/11/22 1104 06/11/22 1105  BP: (!) 142/88 (!) 180/80 (!) 186/90    The patient's blood pressure is elevated above target today.  In order to address the patient's elevated BP: A new medication was prescribed today.; A referral to the Advanced Hypertension Clinic will be placed.    ECG personally reviewed by me today: Sinus bradycardia, rate 50 bpm.  No significant changes from 05/13/2022.   CHA2DS2-VASc Score = 2   This indicates a 2.2% annual risk of stroke. The patient's score is based upon: CHF History: 0 HTN History: 1 Diabetes History: 0 Stroke History: 0 Vascular Disease History: 0 Age Score: 1 Gender Score: 0      Assessment & Plan   Hypertension.  Patient called the office on 06/08/2022 to report elevated blood pressure readings x 2 days.  SBP 160s to 180s, DBP 80s to 100s.  BP has continued to be high.  BP today 180/80 and sometime later 186/90.  His BP cuff showed a reading of 179/89 at the same time. Patient denies dizziness or vision changes.  He had a mild headache over the weekend that resolved with Tylenol.  He is  provided with ED precautions.  Add olmesartan 40 mg daily.  Refer to advance hypertension clinic.  Educated about the importance of sodium restriction.  Provided salty 6 information  sheet.  Continue diltiazem.  BMP in 1 week.  Will request PCP draw as patient has an appointment next week. PAF/chronic anticoagulation.  Onset in the setting of RSV and community-acquired pneumonia.  Patient was started on Eliquis during hospitalization however developed dark tarry heme positive stool so Eliquis was held.  GI evaluation revealed esophageal candidiasis.  Patient was restarted on Eliquis by Dr. Audie Box on 05/13/2022.  He denies any further episodes of dark stool or other bleeding issues.  He has no cardiac awareness of A-fib.  Today he is maintaining sinus with a rate of 50 bpm.  History of GI bleed/esophageal candidiasis.  Patient was found to have dark tarry stools and positive occult blood testing while in the hospital.  His Eliquis was held for period of time until GI could evaluate him.  Upon his return to the clinic to see Dr. Chriss Czar was restarted.  Today he reports no continued episodes of dark stool.  Continue Eliquis.  Disposition: Add olmesartan 40 mg daily.  Continue to log BP.  BMP in 1 week.  Refer to advanced hypertension clinic.  Continue with plan to follow-up with Dr. Audie Box in 6 months.   Justice [place]. Sakiya Stepka, DNP, NP-C     06/11/2022, 11:05 AM [address] Office [phone] Fax [phone]

## 2022-06-11 ENCOUNTER — Encounter: Payer: Self-pay | Admitting: Physician Assistant

## 2022-06-11 ENCOUNTER — Ambulatory Visit: Payer: Medicare Other | Attending: Physician Assistant | Admitting: Student

## 2022-06-11 VITALS — BP 186/90 | HR 50 | Ht 73.0 in | Wt 187.0 lb

## 2022-06-11 DIAGNOSIS — I480 Paroxysmal atrial fibrillation: Secondary | ICD-10-CM

## 2022-06-11 DIAGNOSIS — Z7901 Long term (current) use of anticoagulants: Secondary | ICD-10-CM

## 2022-06-11 DIAGNOSIS — I10 Essential (primary) hypertension: Secondary | ICD-10-CM

## 2022-06-11 DIAGNOSIS — B3781 Candidal esophagitis: Secondary | ICD-10-CM | POA: Diagnosis not present

## 2022-06-11 DIAGNOSIS — K921 Melena: Secondary | ICD-10-CM

## 2022-06-11 MED ORDER — OLMESARTAN MEDOXOMIL 40 MG PO TABS: 40.0000 mg | ORAL_TABLET | Freq: Every day | ORAL | 3 refills | Status: DC

## 2022-06-11 NOTE — Patient Instructions (Addendum)
Medication Instructions:  Olmesartan  40 mg daily.  *If you need a refill on your cardiac medications before your next appointment, please call your pharmacy*   Lab Work: NONE ordered at this time of appointment   If you have labs (blood work) drawn today and your tests are completely normal, you will receive your results only by: [place] (if you have MyChart) OR A paper copy in the mail If you have any lab test that is abnormal or we need to change your treatment, we will call you to review the results.   Testing/Procedures: NONE ordered at this time of appointment     Follow-Up: At [facility], you and your health needs are our priority.  As part of our continuing mission to provide you with exceptional heart care, we have created designated Provider Care Teams.  These Care Teams include your primary Cardiologist (physician) and Advanced Practice Providers (APPs -  Physician Assistants and Nurse Practitioners) who all work together to provide you with the care you need, when you need it.  We recommend signing up for the patient portal called "MyChart".  Sign up information is provided on this After Visit Summary.  MyChart is used to connect with patients for Virtual Visits (Telemedicine).  Patients are able to view lab/test results, encounter notes, upcoming appointments, etc.  Non-urgent messages can be sent to your provider as well.   To learn more about what you can do with MyChart, go to [email or web address].    Your next appointment:   6 month(s)  Provider:   Dr. Marisue Ivan    Other Instructions Monitor Blood pressure. Referral sent to the Hypertension Clinic.

## 2022-06-18 DIAGNOSIS — I10 Essential (primary) hypertension: Secondary | ICD-10-CM | POA: Diagnosis not present

## 2022-06-18 DIAGNOSIS — J449 Chronic obstructive pulmonary disease, unspecified: Secondary | ICD-10-CM | POA: Diagnosis not present

## 2022-06-18 DIAGNOSIS — I4891 Unspecified atrial fibrillation: Secondary | ICD-10-CM | POA: Diagnosis not present

## 2022-06-18 DIAGNOSIS — D6869 Other thrombophilia: Secondary | ICD-10-CM | POA: Diagnosis not present

## 2022-06-19 ENCOUNTER — Encounter (HOSPITAL_BASED_OUTPATIENT_CLINIC_OR_DEPARTMENT_OTHER): Payer: Self-pay | Admitting: Cardiovascular Disease

## 2022-06-19 ENCOUNTER — Ambulatory Visit (HOSPITAL_BASED_OUTPATIENT_CLINIC_OR_DEPARTMENT_OTHER): Payer: Medicare Other | Admitting: Cardiovascular Disease

## 2022-06-19 VITALS — BP 148/78 | HR 58 | Ht 73.0 in | Wt 187.2 lb

## 2022-06-19 DIAGNOSIS — Z006 Encounter for examination for normal comparison and control in clinical research program: Secondary | ICD-10-CM

## 2022-06-19 DIAGNOSIS — I10 Essential (primary) hypertension: Secondary | ICD-10-CM | POA: Diagnosis not present

## 2022-06-19 DIAGNOSIS — I4891 Unspecified atrial fibrillation: Secondary | ICD-10-CM

## 2022-06-19 DIAGNOSIS — N529 Male erectile dysfunction, unspecified: Secondary | ICD-10-CM | POA: Diagnosis not present

## 2022-06-19 HISTORY — DX: Male erectile dysfunction, unspecified: N52.9

## 2022-06-19 MED ORDER — AMLODIPINE BESYLATE 5 MG PO TABS: 5.0000 mg | ORAL_TABLET | Freq: Every day | ORAL | 3 refills | Status: DC

## 2022-06-19 NOTE — Assessment & Plan Note (Signed)
Blood pressure has been uncontrolled since he left the hospital.  In the hospital he was started on diltiazem for A-fib and his home amlodipine and olmesartan were discontinued.  Olmesartan has been restarted but his blood pressures have continued to be very elevated.  We will resume amlodipine 5 mg daily in addition to his current regimen.  He thinks that olmesartan is exacerbating his erectile dysfunction.  Ultimately we would like to get this off of his regimen but given that his blood pressure has been in the 200s at home, we will wait to discuss that at follow-up.  He was given an advance hypertension clinic booklet and asked to check his blood pressures twice a day and the back.  Continue to limit sodium intake and encouraged him to exercise at least 150 minutes weekly.  Check renal and, aldosterone, catecholamines, and metanephrines.  Will also get renal artery Dopplers.  Given that his blood pressure is increased so quickly we will pursue a secondary hypertension screening.

## 2022-06-19 NOTE — Assessment & Plan Note (Signed)
He was asymptomatic when in atrial fibrillation.  He is now tolerating Eliquis and has not had any recurrent bleeding.  Continue diltiazem.

## 2022-06-19 NOTE — Progress Notes (Signed)
Advanced [facility]pert[facility] Initial Assessment:    Date:  06/19/2022   ID:  Kenneth Weber, DOB 1950-09-08, MRN [id number]  PCP:  [name], MD  Cardiologist:  Evalina Field, MD  Nephrologist:  Referring MD: Ledora Bottcher, PA   CC: Hypertension  History of Present Illness:    Kenneth Weber is a 72 y.o. male with a hx of hypertension, atrial fibrillation, COPD, here to establish care in the [facility]. He saw Mayra Reel, NP 06/2022 due to elevated blood pressures at home as high as 188/104. He reported a long history of difficult to control blood pressure and reported having a high sodium diet. In the office his BP was 186/90. He was already taking diltiazem, and olmesartan was added. He was referred to [facility]. He was recently diagnosed with atrila fibrillation 04/2022 in the setting of RSV pneumonia. He was started on diltiazem and Eliquis at that time but developed GI bleeding, so Eliquis was held. GI found esophageal candidiasis. Eliquis was restarted 05/2022. He had an echo that revealed LVEF 50-55%.  Today, he states he has been struggling with hypertension for a while, worse since having COVID 3 years ago. He presents a BP log with readings since his hospitalization, personally reviewed and shows generally higher readings at home than in the office. Yesterday he saw his PCP and his blood pressure was in the [id number] systolic. He has an omron cuff that was tested at his PCP's office and was within 5-6 points. On 06/14/22 he had started olmesartan. Since then he complains of new intermittent headaches not improved with tylenol. He has COPD which he attributes to years of smoking. Lately he complains of discomfort in his throat with feeling the need to cough up sputum but unable to do so. He has also noticed that his voice seems to change/become hoarse more frequently. 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 Sometimes he may become mildly short-winded so he uses his inhaler and continues after a short rest. No chest pain or pressure. Recently he discovered that his diet contains more sodium than he thought. He doesn't add any salt to his meals. More salads, and celery with peanut butter instead of lunch meats. He drinks one cup of coffee a day. No alcohol consumption. A year ago he quit smoking. Current supplements include elderberry and vitamin C. His wife has reported that he snores only when sleeping on his back, but no apneic episodes. He may or may not feel rested in the mornings. Additionally, in the mornings he has noticed that his urine flow has been weaker. Previously losartan/HCTZ was discontinued due to ED but he notes this is an ongoing symptom. He complains of persistently feeling cold, even in the summer time. He denies any palpitations, chest pain, or peripheral edema. No lightheadedness, syncope, orthopnea, or PND.  Previous antihypertensives: Lisinopril - cough Losartan/HCTZ - ED Amlodipine  Past Medical History:  Diagnosis Date   Allergic rhinitis    Chest pain    COPD (chronic obstructive pulmonary disease) (HCC)    ED (erectile dysfunction)    ED (erectile dysfunction) 06/19/2022   Hyperlipidemia    Hypertension    SOB (shortness of breath)     Past Surgical History:  Procedure Laterality Date   ESOPHAGOGASTRODUODENOSCOPY N/A 04/30/2022   Procedure: ESOPHAGOGASTRODUODENOSCOPY (EGD);  Surgeon: Clarene Essex, MD;  Location: [place];  Service: Gastroenterology;  Laterality: N/A;    Current Medications: Current Meds  Medication Sig   albuterol (VENTOLIN HFA) 108 (90 Base) MCG/ACT inhaler Up to 2 puffs every 4 hours as needed (Patient taking differently: Inhale 1-2 puffs into the lungs every 6 (six) hours as needed for shortness of breath. Up to 2 puffs every 4 hours as needed)   amLODipine (NORVASC) 5 MG tablet Take  1 tablet (5 mg total) by mouth daily.   apixaban (ELIQUIS) 5 MG TABS tablet Take 1 tablet (5 mg total) by mouth 2 (two) times daily.   Ascorbic Acid (VITAMIN C) 100 MG tablet Take 100 mg by mouth daily.   Budeson-Glycopyrrol-Formoterol (BREZTRI AEROSPHERE) 160-9-4.8 MCG/ACT AERO Take 2 puffs first thing in am and then another 2 puffs about 12 hours later. (Patient taking differently: Inhale 2 puffs into the lungs 2 (two) times daily. Take 2 puffs first thing in am and then another 2 puffs about 12 hours later.)   diltiazem (CARDIZEM CD) 120 MG 24 hr capsule Take 1 capsule (120 mg total) by mouth daily.   Elderberry 500 MG CAPS Take 1 tablet by mouth daily.   latanoprost (XALATAN) 0.005 % ophthalmic solution Place 1 drop into both eyes at bedtime.   olmesartan (BENICAR) 40 MG tablet Take 1 tablet (40 mg total) by mouth daily.   Omega-3 Fatty Acids (FISH OIL) 1000 MG CAPS Take 2 capsules by mouth daily.   timolol (TIMOPTIC) 0.5 % ophthalmic solution Place 1 drop into both eyes every morning.     Allergies:   Other, Streptogramins, Codeine, Lisinopril, and Losartan potassium-hctz   Social History   Socioeconomic History   Marital status: Married    Spouse name: Not on file   Number of children: Not on file   Years of education: Not on file   Highest education level: Not on file  Occupational History   Not on file  Tobacco Use   Smoking status: Former    Packs/day: 1.50    Years: 40.00    Total pack years: 60.00    Types: Cigarettes    Quit date: 09/13/2013    Years since quitting: 8.7   Smokeless tobacco: Never   Tobacco comments:    Patient reports cigars at times but trying to quit, 12/01/2020.   Vaping Use   Vaping Use: Never used  Substance and Sexual Activity   Alcohol use: No   Drug use: No   Sexual activity: Not on file  Other Topics Concern   Not on file  Social History Narrative   Not on file   Social Determinants of Health   Financial Resource Strain: Not on file   Food Insecurity: No Food Insecurity (04/28/2022)   Hunger Vital Sign    Worried About Running Out of Food in the Last Year: Never true    Ran Out of Food in the Last Year: Never true  Transportation Needs: No Transportation Needs (04/28/2022)   PRAPARE - Hydrologist (Medical): No    Lack of Transportation (Non-Medical): No  Physical Activity: Inactive (06/19/2022)   Exercise Vital Sign    Days of Exercise per Week: 0 days    Minutes of Exercise per Session: 0 min  Stress: Not on file  Social Connections: Not on file     Family History: The patient's family history includes CAD in his brother and father; Colon cancer in his father; Hypertension in his father and mother.  ROS:   Please see the  history of present illness.    (+) Headaches (+) Shortness of breath (+) Easy bruising (+) Positional snoring (+) Weak urinary flow (+) ED (+) Hoarseness (+) Coldness All other systems reviewed and are negative.  EKGs/Labs/Other Studies Reviewed:    Monitor 05/2022: Patch Wear Time:  7 days and 1 hours (2024-01-12T19:56:02-499 to 2024-01-19T21:13:39-0500)   Patient had a min HR of 43 bpm (sinus bradycardia), max HR of 141 bpm (atrial fibrillation), and avg HR of 62 bpm (normal sinus rhythm). Predominant underlying rhythm was Sinus Rhythm. Atrial Fibrillation occurred (<1% burden), ranging from 80-141 bpm (avg of 98 bpm), the longest lasting 35 mins 17 secs with an avg rate of 98 bpm. Isolated SVEs were rare (<1.0%), SVE Couplets were rare (<1.0%), and SVE Triplets were rare (<1.0%). Isolated VEs were rare (<1.0%), and no VE Couplets or VE Triplets were present.    Impression: Paroxysmal atrial fibrillation detected (burden <1%).  Rare ectopy.   Echo  04/26/2022: Sonographer Comments: Suboptimal parasternals due to lung interference,  active URI.   IMPRESSIONS   1. EF challenging in the setting of atrial fibrillation . Left  ventricular ejection  fraction, by estimation, is 50 to 55%. The left  ventricle has low normal function. The left ventricle demonstrates global  hypokinesis. Indeterminate diastolic filling  due to E-A fusion.   2. Right ventricular systolic function is normal. The right ventricular  size is normal. Tricuspid regurgitation signal is inadequate for assessing  PA pressure.   3. Left atrial size was mildly dilated.   4. No evidence of mitral valve regurgitation.   5. The aortic valve was not well visualized. Aortic valve regurgitation  is not visualized.   6. The inferior vena cava is normal in size with greater than 50%  respiratory variability, suggesting right atrial pressure of 3 mmHg.   Comparison(s): No prior Echocardiogram.   CTA Chest  04/26/2022: IMPRESSION: There is no evidence of pulmonary artery embolism. There is no evidence of thoracic aortic dissection. Coronary artery calcifications are seen. Small pericardial effusion is present.   There are patchy infiltrates in both lower lung fields, more so on the right side suggesting atelectasis/pneumonia. There is minimal right pleural effusion.   Other findings as described in the body of the report.  ETT  09/22/2014: There was no ST segment deviation noted during stress.   Excellent exercise capacity. No chest pain. Normal BP response to exercise. No ST changes to suggest ischemia. There were occasional PVCs during exercise and recovery. FU with PCP as planned.  EKG:  EKG is personally reviewed. 06/19/2022: EKG was not ordered.  Recent Labs: 04/22/2022: ALT 17 04/25/2022: TSH 0.158 04/26/2022: B Natriuretic Peptide 279.8; Magnesium 2.2 05/01/2022: BUN 27; Creatinine, Ser 1.23; Hemoglobin 10.6; Platelets 325; Potassium 4.6; Sodium 137   Recent Lipid Panel No results found for: "CHOL", "TRIG", "HDL", "CHOLHDL", "VLDL", "LDLCALC", "LDLDIRECT"  Physical Exam:    VS:  BP (!) 148/78 (BP Location: Left Arm, Patient Position: Sitting, Cuff  Size: Normal)   Pulse (!) 58   Ht 6' 1"$  (1.854 m)   Wt 187 lb 3.2 oz (84.9 kg)   SpO2 97%   BMI 24.70 kg/m  , BMI Body mass index is 24.7 kg/m. GENERAL:  Well appearing HEENT: Pupils equal round and reactive, fundi not visualized, oral mucosa unremarkable NECK:  No jugular venous distention, waveform within normal limits, carotid upstroke brisk and symmetric, no bruits, no thyromegaly LUNGS:  Clear to auscultation bilaterally HEART:  RRR.  PMI not  displaced or sustained,S1 and S2 within normal limits, no S3, no S4, no clicks, no rubs, no murmurs ABD:  Flat, positive bowel sounds normal in frequency in pitch, no bruits, no rebound, no guarding, no midline pulsatile mass, no hepatomegaly, no splenomegaly EXT:  2 plus pulses throughout, no edema, no cyanosis, no clubbing SKIN:  No rashes, no nodules NEURO:  Cranial nerves II through XII grossly intact, motor grossly intact throughout PSYCH:  Cognitively intact, oriented to person place and time   ASSESSMENT/PLAN:    Hypertension Blood pressure has been uncontrolled since he left the hospital.  In the hospital he was started on diltiazem for A-fib and his home amlodipine and olmesartan were discontinued.  Olmesartan has been restarted but his blood pressures have continued to be very elevated.  We will resume amlodipine 5 mg daily in addition to his current regimen.  He thinks that olmesartan is exacerbating his erectile dysfunction.  Ultimately we would like to get this off of his regimen but given that his blood pressure has been in the 200s at home, we will wait to discuss that at follow-up.  He was given an advance hypertension clinic booklet and asked to check his blood pressures twice a day and the back.  Continue to limit sodium intake and encouraged him to exercise at least 150 minutes weekly.  Check renal and, aldosterone, catecholamines, and metanephrines.  Will also get renal artery Dopplers.  Given that his blood pressure is increased  so quickly we will pursue a secondary hypertension screening.  Atrial fibrillation with rapid ventricular response ([place]) He was asymptomatic when in atrial fibrillation.  He is now tolerating Eliquis and has not had any recurrent bleeding.  Continue diltiazem.  ED (erectile dysfunction) Mr. Freid is struggled with erectile dysfunction.  He also has hesitancy with his urination.  Recommend that he be seen by urology.  He does think that olmesartan has contributed.  We will work on getting this ARB off of his list in the future as above.  Screening for Secondary Hypertension:     06/19/2022   10:14 AM  Causes  Drugs/Herbals Screened     - Comments no added salt.  1 coffee.  No EtOh,  no illicits  Sleep Apnea Screened     - Comments snores only on back.  no other symptoms  Thyroid Disease Screened  Hyperaldosteronism Screened     - Comments check renin and aldosterone  Pheochromocytoma Screened  Cushing's Syndrome N/A  Hyperparathyroidism Screened    Relevant Labs/Studies:    Latest Ref Rng & Units 05/01/2022    2:52 AM 04/29/2022    3:04 AM 04/28/2022   10:23 AM  Basic Labs  Sodium 135 - 145 mmol/L 137  138  137   Potassium 3.5 - 5.1 mmol/L 4.6  4.6  3.9   Creatinine 0.61 - 1.24 mg/dL 1.23  1.42  1.32        Latest Ref Rng & Units 04/25/2022    4:12 AM  Thyroid   TSH 0.350 - 4.500 uIU/mL 0.158                 06/19/2022   10:51 AM  Renovascular   Renal Artery [place] Completed Yes     Disposition:    FU with APP/PharmD in 1 month for the next 3 months.   FU with [place] C. Oval Linsey, MD, [facility] in 4 months.  Medication Adjustments/Labs and Tests Ordered: Current medicines are reviewed at length with the patient  today.  Concerns regarding medicines are outlined above.   Orders Placed This Encounter  Procedures   Catecholamines, fractionated, plasma   Metanephrines, plasma   Aldosterone + renin activity w/ ratio   Ambulatory referral to Urology   VAS US RENAL ARTERY  DUPLEX   Meds ordered this encounter  Medications   amLODipine (NORVASC) 5 MG tablet    Sig: Take 1 tablet (5 mg total) by mouth daily.    Dispense:  90 tablet    Refill:  3    I,Mathew Stumpf,acting as a scribe for Skeet Latch, MD.,have documented all relevant documentation on the behalf of Skeet Latch, MD,as directed by  Skeet Latch, MD while in the presence of Skeet Latch, MD.  I, [place] Oval Linsey, MD have reviewed all documentation for this visit.  The documentation of the exam, diagnosis, procedures, and orders on 06/19/2022 are all accurate and complete.   Signed, Skeet Latch, MD  06/19/2022 3:44 PM    [place]

## 2022-06-19 NOTE — Assessment & Plan Note (Signed)
Kenneth Weber is struggled with erectile dysfunction.  He also has hesitancy with his urination.  Recommend that he be seen by urology.  He does think that olmesartan has contributed.  We will work on getting this ARB off of his list in the future as above.

## 2022-06-19 NOTE — Research (Signed)
  Subject Name: Kenneth Weber met inclusion and exclusion criteria for the Virtual Care and Social Determinant Interventions for the management of hypertension trial.  The informed consent form, study requirements and expectations were reviewed with the subject by Dr. Oval Linsey and myself. The subject was given the opportunity to read the consent and ask questions. The subject verbalized understanding of the trial requirements.  All questions were addressed prior to the signing of the consent form. The subject agreed to participate in the trial and signed the informed consent. The informed consent was obtained prior to performance of any protocol-specific procedures for the subject.  A copy of the signed informed consent was given to the subject and a copy was placed in the subject's medical record.  Kenneth Weber was randomized to Group 1.

## 2022-06-19 NOTE — Patient Instructions (Addendum)
Medication Instructions:  START AMLODIPINE 5 MG DAILY    Labwork: RENIN/ALDOSTERONE/CATACHOLAMINES/METANEPHRINES TODAY    Testing/Procedures: Your physician has requested that you have a renal artery duplex. During this test, an ultrasound is used to evaluate blood flow to the kidneys. Allow one hour for this exam. Do not eat after midnight the day before and avoid carbonated beverages. Take your medications as you usually do.   Follow-Up: 07/23/2022 10:00 AM WITH PHARM D   Referrals:  YOU HAVE BEEN REFERRED TO UROLOGY   Special Instructions:  MONITOR YOUR BLOOD PRESSURE TWICE A DAY, LOG IN THE BOOK PROVIDED. BRING THE BOOK AND YOUR BLOOD PRESSURE MACHINE TO YOUR FOLLOW UP IN 1 MONTH    DASH Eating Plan DASH stands for "Dietary Approaches to Stop Hypertension." The DASH eating plan is a healthy eating plan that has been shown to reduce high blood pressure (hypertension). It may also reduce your risk for type 2 diabetes, heart disease, and stroke. The DASH eating plan may also help with weight loss. What are tips for following this plan?  General guidelines Avoid eating more than 2,300 mg (milligrams) of salt (sodium) a day. If you have hypertension, you may need to reduce your sodium intake to 1,500 mg a day. Limit alcohol intake to no more than 1 drink a day for nonpregnant women and 2 drinks a day for men. One drink equals 12 oz of beer, 5 oz of wine, or 1 oz of hard liquor. Work with your health care provider to maintain a healthy body weight or to lose weight. Ask what an ideal weight is for you. Get at least 30 minutes of exercise that causes your heart to beat faster (aerobic exercise) most days of the week. Activities may include walking, swimming, or biking. Work with your health care provider or diet and nutrition specialist (dietitian) to adjust your eating plan to your individual calorie needs. Reading food labels  Check food labels for the amount of sodium per serving.  Choose foods with less than 5 percent of the Daily Value of sodium. Generally, foods with less than 300 mg of sodium per serving fit into this eating plan. To find whole grains, look for the word "whole" as the first word in the ingredient list. Shopping Buy products labeled as "low-sodium" or "no salt added." Buy fresh foods. Avoid canned foods and premade or frozen meals. Cooking Avoid adding salt when cooking. Use salt-free seasonings or herbs instead of table salt or sea salt. Check with your health care provider or pharmacist before using salt substitutes. Do not fry foods. Cook foods using healthy methods such as baking, boiling, grilling, and broiling instead. Cook with heart-healthy oils, such as olive, canola, soybean, or sunflower oil. Meal planning Eat a balanced diet that includes: 5 or more servings of fruits and vegetables each day. At each meal, try to fill half of your plate with fruits and vegetables. Up to 6-8 servings of whole grains each day. Less than 6 oz of lean meat, poultry, or fish each day. A 3-oz serving of meat is about the same size as a deck of cards. One egg equals 1 oz. 2 servings of low-fat dairy each day. A serving of nuts, seeds, or beans 5 times each week. Heart-healthy fats. Healthy fats called Omega-3 fatty acids are found in foods such as flaxseeds and coldwater fish, like sardines, salmon, and mackerel. Limit how much you eat of the following: Canned or prepackaged foods. Food that is high in trans  fat, such as fried foods. Food that is high in saturated fat, such as fatty meat. Sweets, desserts, sugary drinks, and other foods with added sugar. Full-fat dairy products. Do not salt foods before eating. Try to eat at least 2 vegetarian meals each week. Eat more home-cooked food and less restaurant, buffet, and fast food. When eating at a restaurant, ask that your food be prepared with less salt or no salt, if possible. What foods are recommended? The  items listed may not be a complete list. Talk with your dietitian about what dietary choices are best for you. Grains Whole-grain or whole-wheat bread. Whole-grain or whole-wheat pasta. Brown rice. [place]. Bulgur. Whole-grain and low-sodium cereals. Pita bread. Low-fat, low-sodium crackers. Whole-wheat flour tortillas. Vegetables Fresh or frozen vegetables (raw, steamed, roasted, or grilled). Low-sodium or reduced-sodium tomato and vegetable juice. Low-sodium or reduced-sodium tomato sauce and tomato paste. Low-sodium or reduced-sodium canned vegetables. Fruits All fresh, dried, or frozen fruit. Canned fruit in natural juice (without added sugar). Meat and other protein foods Skinless chicken or [place]. Ground chicken or [place]. Pork with fat trimmed off. Fish and seafood. Egg whites. Dried beans, peas, or lentils. Unsalted nuts, nut butters, and seeds. Unsalted canned beans. Lean cuts of beef with fat trimmed off. Low-sodium, lean deli meat. Dairy Low-fat (1%) or fat-free (skim) milk. Fat-free, low-fat, or reduced-fat cheeses. Nonfat, low-sodium ricotta or cottage cheese. Low-fat or nonfat yogurt. Low-fat, low-sodium cheese. Fats and oils Soft margarine without trans fats. Vegetable oil. Low-fat, reduced-fat, or light mayonnaise and salad dressings (reduced-sodium). Canola, safflower, olive, soybean, and sunflower oils. Avocado. Seasoning and other foods Herbs. Spices. Seasoning mixes without salt. Unsalted popcorn and pretzels. Fat-free sweets. What foods are not recommended? The items listed may not be a complete list. Talk with your dietitian about what dietary choices are best for you. Grains Baked goods made with fat, such as croissants, muffins, or some breads. Dry pasta or rice meal packs. Vegetables Creamed or fried vegetables. Vegetables in a cheese sauce. Regular canned vegetables (not low-sodium or reduced-sodium). Regular canned tomato sauce and paste (not low-sodium or  reduced-sodium). Regular tomato and vegetable juice (not low-sodium or reduced-sodium). Angie Fava. Olives. Fruits Canned fruit in a light or heavy syrup. Fried fruit. Fruit in cream or butter sauce. Meat and other protein foods Fatty cuts of meat. Ribs. Fried meat. Berniece Salines. Sausage. Bologna and other processed lunch meats. Salami. Fatback. Hotdogs. Bratwurst. Salted nuts and seeds. Canned beans with added salt. Canned or smoked fish. Whole eggs or egg yolks. Chicken or [place] with skin. Dairy Whole or 2% milk, cream, and half-and-half. Whole or full-fat cream cheese. Whole-fat or sweetened yogurt. Full-fat cheese. Nondairy creamers. Whipped toppings. Processed cheese and cheese spreads. Fats and oils Butter. Stick margarine. Lard. Shortening. Ghee. Bacon fat. Tropical oils, such as coconut, palm kernel, or palm oil. Seasoning and other foods Salted popcorn and pretzels. Onion salt, garlic salt, seasoned salt, table salt, and sea salt. Worcestershire sauce. Tartar sauce. Barbecue sauce. Teriyaki sauce. Soy sauce, including reduced-sodium. Steak sauce. Canned and packaged gravies. Fish sauce. Oyster sauce. Cocktail sauce. Horseradish that you find on the shelf. Ketchup. Mustard. Meat flavorings and tenderizers. Bouillon cubes. Hot sauce and Tabasco sauce. Premade or packaged marinades. Premade or packaged taco seasonings. Relishes. Regular salad dressings. Where to find more information: National Heart, Lung, and [place]: https://wilson-eaton.com/ American Heart Association: www.heart.org Summary The DASH eating plan is a healthy eating plan that has been shown to reduce high blood pressure (hypertension). It may  also reduce your risk for type 2 diabetes, heart disease, and stroke. With the DASH eating plan, you should limit salt (sodium) intake to 2,300 mg a day. If you have hypertension, you may need to reduce your sodium intake to 1,500 mg a day. When on the DASH eating plan, aim to eat more fresh  fruits and vegetables, whole grains, lean proteins, low-fat dairy, and heart-healthy fats. Work with your health care provider or diet and nutrition specialist (dietitian) to adjust your eating plan to your individual calorie needs. This information is not intended to replace advice given to you by your health care provider. Make sure you discuss any questions you have with your health care provider. Document Released: 04/11/2011 Document Revised: 04/04/2017 Document Reviewed: 04/15/2016 Elsevier Patient Education  2020 [employer].

## 2022-06-21 NOTE — Telephone Encounter (Signed)
Received forms for [employer] Eliquis 5 mg. Faxed over application and POI today 02/16.

## 2022-06-24 ENCOUNTER — Telehealth: Payer: Self-pay | Admitting: Cardiovascular Disease

## 2022-06-24 ENCOUNTER — Other Ambulatory Visit: Payer: Self-pay

## 2022-06-24 DIAGNOSIS — D500 Iron deficiency anemia secondary to blood loss (chronic): Secondary | ICD-10-CM | POA: Diagnosis not present

## 2022-06-24 NOTE — Telephone Encounter (Signed)
Pt c/o medication issue:  1. Name of Medication: amLODipine (NORVASC) 5 MG tablet   diltiazem (CARDIZEM CD) 120 MG 24 hr capsule   2. How are you currently taking this medication (dosage and times per day)?    3. Are you having a reaction (difficulty breathing--STAT)? no  4. What is your medication issue? Wife calling because the pharmacy told them that they are taking medication of the same thing. And also states the bp hasn't come down. Please advise

## 2022-06-24 NOTE — Telephone Encounter (Signed)
Discussed with Dr Oval Linsey and patient is to be taking both Left detailed message at pharmacy

## 2022-06-24 NOTE — Telephone Encounter (Signed)
Pt c/o medication issue:  1. Name of Medication: amLODipine (NORVASC) 5 MG tablet  diltiazem (CARDIZEM CD) 120 MG 24 hr capsule   2. How are you currently taking this medication (dosage and times per day)?   3. Are you having a reaction (difficulty breathing--STAT)?   4. What is your medication issue? [place] wanting to know if patient is to be on both of these medication. Please advise.

## 2022-06-24 NOTE — Telephone Encounter (Signed)
Returned call. Spoke with pt's wife. She states "the drug store said he was taking two medications that are the same." After reviewing the medications she says he is still taking Olmesartan. She states they were not told he needed to d/c that medication. I informed her of what Dr. Blenda Mounts notes said. She verbalized understanding and thanked me for calling them back.

## 2022-06-27 LAB — ALDOSTERONE + RENIN ACTIVITY W/ RATIO
Aldos/Renin Ratio: 2 (ref 0.0–30.0)
Aldosterone: 2.9 ng/dL (ref 0.0–30.0)
Renin Activity, Plasma: 1.446 ng/mL/hr (ref 0.167–5.380)

## 2022-06-27 LAB — METANEPHRINES, PLASMA
Metanephrine, Free: <25 pg/mL (ref 0.0–88.0)
Normetanephrine, Free: 87.2 pg/mL (ref 0.0–285.2)

## 2022-06-27 LAB — CATECHOLAMINES, FRACTIONATED, PLASMA
Dopamine: <30 pg/mL (ref 0–48)
Epinephrine: 39 pg/mL (ref 0–62)
Norepinephrine: 887 pg/mL — ABNORMAL HIGH (ref 0–874)

## 2022-06-28 DIAGNOSIS — D500 Iron deficiency anemia secondary to blood loss (chronic): Secondary | ICD-10-CM | POA: Diagnosis not present

## 2022-07-09 ENCOUNTER — Encounter (HOSPITAL_BASED_OUTPATIENT_CLINIC_OR_DEPARTMENT_OTHER): Payer: Medicare Other

## 2022-07-10 ENCOUNTER — Telehealth (HOSPITAL_BASED_OUTPATIENT_CLINIC_OR_DEPARTMENT_OTHER): Payer: Self-pay | Admitting: Cardiovascular Disease

## 2022-07-10 NOTE — Telephone Encounter (Signed)
Left message for patient to call and discuss scheduling the follow up renal doppler ordered by Dr. Oval Linsey

## 2022-07-12 ENCOUNTER — Telehealth: Payer: Self-pay | Admitting: *Deleted

## 2022-07-12 ENCOUNTER — Other Ambulatory Visit: Payer: Self-pay | Admitting: Gastroenterology

## 2022-07-12 NOTE — Telephone Encounter (Signed)
Dr. Oval Linsey, you recently saw this pt in clinic on 06/19/2022. Please advise on surgical clearance for upcoming colonoscopy. Please route your response to P CV DIV PREOP.   Thank you, Sharyn Lull

## 2022-07-12 NOTE — Telephone Encounter (Signed)
Patient with diagnosis of afib on Eliquis for anticoagulation.    Procedure: COLONOSCOPY; ANEMIA  Date of procedure: 07/22/22   CHA2DS2-VASc Score = 2   This indicates a 2.2% annual risk of stroke. The patient's score is based upon: CHF History: 0 HTN History: 1 Diabetes History: 0 Stroke History: 0 Vascular Disease History: 0 Age Score: 1 Gender Score: 0      CrCl 67 ml/min  Per office protocol, patient can hold Eliquis for 2 days prior to procedure.    **This guidance is not considered finalized until pre-operative APP has relayed final recommendations.**

## 2022-07-12 NOTE — Telephone Encounter (Signed)
   Pre-operative Risk Assessment    Patient Name: Kenneth Weber  DOB: 10/05/1950 MRN: [id number]      Request for Surgical Clearance    Procedure:   COLONOSCOPY; ANEMIA  Date of Surgery:  Clearance 07/22/22                                 Surgeon:  DR. [facility] Surgeon's Group or Practice Name:  [facility] Phone number:  [phone] Fax number:  [phone]   Type of Clearance Requested:   - Medical  - Pharmacy:  Hold Apixaban (Eliquis)     Type of Anesthesia:   PROPOFOL   Additional requests/questions:    Jiles Prows   07/12/2022, 2:55 PM

## 2022-07-15 ENCOUNTER — Encounter (HOSPITAL_COMMUNITY): Payer: Self-pay | Admitting: Gastroenterology

## 2022-07-17 NOTE — Telephone Encounter (Signed)
Looks like this was sent to pre op 07/12/22. I will send back to pre op as the surgeon office has sent 2nd request.

## 2022-07-17 NOTE — Telephone Encounter (Signed)
Patient was contacted and cleared by phone please see note in chart.

## 2022-07-17 NOTE — Telephone Encounter (Addendum)
   Name: Kenneth Weber  DOB: [birth year]  MRN: [id number]   Primary Cardiologist: Evalina Field, MD  Chart reviewed as part of pre-operative protocol coverage. Patient was contacted 07/17/2022 in reference to pre-operative risk assessment for pending surgery as outlined below.  Kartel Wolbert was last seen on 06/19/22 by Dr. Oval Linsey.  Since that day, Chirag Krueger has done well and has not had any cardiac complaints.  He is able to complete greater than 4 METS of activity without any difficulty and his RCRI score is 0.4  Therefore, based on ACC/AHA guidelines, the patient would be at acceptable risk for the planned procedure without further cardiovascular testing.   Per office protocol, patient can hold Eliquis for 2 days prior to procedure.   The patient was advised that if he develops new symptoms prior to surgery to contact our office to arrange for a follow-up visit, and he verbalized understanding.  I will route this recommendation to the requesting party via Epic fax function and remove from pre-op pool. Please call with questions.  Mable Fill, Marissa Nestle, NP 07/17/2022, 4:06 PM

## 2022-07-22 ENCOUNTER — Encounter (HOSPITAL_COMMUNITY): Payer: Self-pay | Admitting: Gastroenterology

## 2022-07-22 NOTE — Progress Notes (Signed)
Office Visit    Patient Name: Kenneth Weber Date of Encounter: 07/23/2022  Primary Care Provider:  [name], MD Primary Cardiologist:  Evalina Field, MD  Chief Complaint    Hypertension - Advanced hypertension clinic  Past Medical History   AFib CHADS2-VASc = 2 (age, htn)  COPD On Breztri bid    Allergies  Allergen Reactions   Other    Streptogramins    Codeine Nausea And Vomiting   Lisinopril Cough   Losartan Potassium-Hctz Other (See Comments)    Erectile Dysfunction    History of Present Illness    Lyrik Kenneth Weber is a 72 y.o. male patient who was referred to the Advanced Hypertension Clinic by Doreene Adas PA.  He was seen in the [facility] office on Feb 6 with a BP of 186/90 and olmesartan 40 mg daily was started.  He was able to get in with Dr. Oval Linsey just a week later.  At the time he was reporting home BP readings up to 188/104.  She had him re-start amlodipine 5 gm (had been held on hospital discharge) and start home monitoring.  He was agreeable to join the [employer] and randomized to group 1.    Today he is in the office for follow up.  There was some confusion after his last appointment.  He was told at the pharmacy to double check because he was prescribed both amlodipine and diltiazem.  When he called the office he thought he was told to hold the olmesartan.  He has not had any since mid February.  He is also unsure if he is taking amlodipine.  Tomorrow he is scheduled for a colonoscopy and has renal dopplers on April 1.    Blood Pressure Goal:  130/80  Current Medications: amlodipine 5 mg qd (??), diltiazem 120 mg qd, olmesartan 40 mg qd (??)  Adherence Assessment  Do you ever forget to take your medication? [] Yes [x] No  Do you ever skip doses due to side effects? [] Yes [x] No  Do you have trouble affording your medicines? [] Yes [x] No  Are you ever unable to pick up your medication due to transportation difficulties? [] Yes [x] No  Do  you ever stop taking your medications because you don't believe they are helping? [] Yes [x] No  Do you check your weight daily? [] Yes [] No   Adherence strategy: 7 day pill minder  Previously tried:   Lisinopril - cough        Losartan/HCTZ - ED  Family Hx:   both parents - CABG x 3 in dad at 21 died last year at 42 dementia, colon cancer - died cancer free; mother with DM, died at 18; sister has anxieties; brother with SABG x 2 at 62, doing well now; 1 son no heart issues - fireman  Social Hx:      Tobacco: former smoker - 60 pack year history, quit 2015  Alcohol: no  Caffeine:  1 coffee per day,   4-5 bottles of water daily  Diet: eats out regularly - doesn't add salt, doesn't do fast food excpet rarely; lots of chicken and salads     Exercise: walk at work - [employer] 15-20,000  Home BP readings:       Accessory Clinical Findings    Lab Results  Component Value Date   CREATININE 1.23 05/01/2022   BUN 27 (H) 05/01/2022   NA 137 05/01/2022   K 4.6 05/01/2022   CL 104 05/01/2022   CO2 27  05/01/2022   Lab Results  Component Value Date   ALT 17 04/22/2022   AST 19 04/22/2022   ALKPHOS 76 04/22/2022   BILITOT 1.0 04/22/2022   No results found for: "HGBA1C"  Screening for Secondary Hypertension:      06/19/2022   10:14 AM  Causes  Drugs/Herbals Screened     - Comments no added salt.  1 coffee.  No EtOh,  no illicits  Sleep Apnea Screened     - Comments snores only on back.  no other symptoms  Thyroid Disease Screened  Hyperaldosteronism Screened     - Comments check renin and aldosterone  Pheochromocytoma Screened  Cushing's Syndrome N/A  Hyperparathyroidism Screened    Relevant Labs/Studies:    Latest Ref Rng & Units 05/01/2022    2:52 AM 04/29/2022    3:04 AM 04/28/2022   10:23 AM  Basic Labs  Sodium 135 - 145 mmol/L 137  138  137   Potassium 3.5 - 5.1 mmol/L 4.6  4.6  3.9   Creatinine 0.61 - 1.24 mg/dL 1.23  1.42  1.32         Latest Ref Rng & Units 04/25/2022    4:12 AM  Thyroid   TSH 0.350 - 4.500 uIU/mL 0.158        Latest Ref Rng & Units 06/19/2022   11:19 AM  Renin/Aldosterone   Aldosterone 0.0 - 30.0 ng/dL 2.9   Aldos/Renin Ratio 0.0 - 30.0 2.0        Latest Ref Rng & Units 06/19/2022   11:19 AM  Metanephrines/Catecholamines   Epinephrine 0 - 62 pg/mL 39   Norepinephrine 0 - 874 pg/mL 887   Dopamine 0 - 48 pg/mL <30   Metanephrines 0.0 - 88.0 pg/mL <25.0   Normetanephrines  0.0 - 285.2 pg/mL 87.2           06/19/2022   10:51 AM  Renovascular   Renal Artery [place] Completed Yes      Home Medications    Current Outpatient Medications  Medication Sig Dispense Refill   acetaminophen (TYLENOL) 500 MG tablet Take 1,000 mg by mouth every 6 (six) hours as needed for headache.     albuterol (VENTOLIN HFA) 108 (90 Base) MCG/ACT inhaler Up to 2 puffs every 4 hours as needed (Patient taking differently: Inhale 1-2 puffs into the lungs every 6 (six) hours as needed for shortness of breath. Up to 2 puffs every 4 hours as needed) 1 each 11   amLODipine (NORVASC) 5 MG tablet Take 1 tablet (5 mg total) by mouth daily. (Patient not taking: Reported on 07/22/2022) 90 tablet 3   apixaban (ELIQUIS) 5 MG TABS tablet Take 1 tablet (5 mg total) by mouth 2 (two) times daily. 60 tablet 3   ascorbic acid (VITAMIN C) 500 MG tablet Take 500 mg by mouth daily.     Budeson-Glycopyrrol-Formoterol (BREZTRI AEROSPHERE) 160-9-4.8 MCG/ACT AERO Take 2 puffs first thing in am and then another 2 puffs about 12 hours later. (Patient taking differently: Inhale 2 puffs into the lungs 2 (two) times daily. Take 2 puffs first thing in am and then another 2 puffs about 12 hours later.) 10.7 g 11   diltiazem (CARDIZEM CD) 120 MG 24 hr capsule Take 1 capsule (120 mg total) by mouth daily. 30 capsule 3   doxylamine, Sleep, (UNISOM) 25 MG tablet Take 25 mg by mouth at bedtime as needed for sleep.     Elderberry 500 MG CAPS Take 500 mg by  mouth daily.     latanoprost (XALATAN) 0.005 % ophthalmic solution Place 1 drop into both eyes at bedtime.     olmesartan (BENICAR) 40 MG tablet Take 1 tablet (40 mg total) by mouth daily. 90 tablet 3   Omega-3 Fatty Acids (FISH OIL) 1000 MG CAPS Take 2 capsules by mouth daily.     timolol (TIMOPTIC) 0.5 % ophthalmic solution Place 1 drop into both eyes every morning.     No current facility-administered medications for this visit.     Assessment & Plan   HYPERTENSION CONTROL Vitals:   07/23/22 1032 07/23/22 1041  BP: (!) 152/80 (!) 153/81    The patient's blood pressure is elevated above target today.  In order to address the patient's elevated BP:       Hypertension Assessment: BP is uncontrolled in office BP 153/81 mmHg;  above the goal (<130/80). Patient not taking olmesartan and maybe amlodipine 2/2 confusion about orders Denies SOB, palpitation, chest pain, headaches,or swelling Reiterated the importance of regular exercise and low salt diet   Plan:  Patient should be taking taking olmesartan, amlodipine AND diltiazem Explained differences in CCB and why okay to take both.  Patient to keep record of BP readings with heart rate and report to [place] at the next visit Patient to follow up with PharmD in 6 weeks for follow up  Labs ordered today:  none   Tommy Medal PharmD CPP [place]  [address] [place], [place] [address] [phone]

## 2022-07-23 ENCOUNTER — Telehealth (HOSPITAL_BASED_OUTPATIENT_CLINIC_OR_DEPARTMENT_OTHER): Payer: Self-pay | Admitting: Cardiovascular Disease

## 2022-07-23 ENCOUNTER — Ambulatory Visit (HOSPITAL_BASED_OUTPATIENT_CLINIC_OR_DEPARTMENT_OTHER): Payer: Medicare Other | Admitting: Pharmacist Clinician (PhC)/ Clinical Pharmacy Specialist

## 2022-07-23 ENCOUNTER — Encounter (HOSPITAL_BASED_OUTPATIENT_CLINIC_OR_DEPARTMENT_OTHER): Payer: Self-pay | Admitting: Pharmacist Clinician (PhC)/ Clinical Pharmacy Specialist

## 2022-07-23 VITALS — BP 153/81 | HR 56 | Ht 73.0 in | Wt 188.6 lb

## 2022-07-23 DIAGNOSIS — I10 Essential (primary) hypertension: Secondary | ICD-10-CM | POA: Diagnosis not present

## 2022-07-23 DIAGNOSIS — I4891 Unspecified atrial fibrillation: Secondary | ICD-10-CM | POA: Diagnosis not present

## 2022-07-23 MED ORDER — OLMESARTAN MEDOXOMIL 40 MG PO TABS: 40.0000 mg | ORAL_TABLET | Freq: Every day | ORAL | 3 refills | Status: DC

## 2022-07-23 NOTE — Patient Instructions (Signed)
Follow up appointment: Thursday April 25 at 11:30 am  Take your BP meds as follows: Restart olmesartan 40 mg; continue with amlodipine and diltiazem  Check with the GI MD tomorrow to determine when to re-start the Eliquis (Wednesday evening or Thursday am)   Check your blood pressure at home daily (if able) and keep record of the readings.  Hypertension "High blood pressure"  Hypertension is often called "The Silent Killer." It rarely causes symptoms until it is extremely  high or has done damage to other organs in the body. For this reason, you should have your  blood pressure checked regularly by your physician. We will check your blood pressure  every time you see a provider at one of our offices.   Your blood pressure reading consists of two numbers. Ideally, blood pressure should be  below 120/80. The first ("top") number is called the systolic pressure. It measures the  pressure in your arteries as your heart beats. The second ("bottom") number is called the diastolic pressure. It measures the pressure in your arteries as the heart relaxes between beats.  The benefits of getting your blood pressure under control are enormous. A 10-point  reduction in systolic blood pressure can reduce your risk of stroke by 27% and heart failure by 28%  Your blood pressure goal is <130/80  To check your pressure at home you will need to:  1. Sit up in a chair, with feet flat on the floor and back supported. Do not cross your ankles or legs. 2. Rest your left arm so that the cuff is about heart level. If the cuff goes on your upper arm,  then just relax the arm on the table, arm of the chair or your lap. If you have a wrist cuff, we  suggest relaxing your wrist against your chest (think of it as Pledging the Flag with the  wrong arm).  3. Place the cuff snugly around your arm, about 1 inch above the crook of your elbow. The  cords should be inside the groove of your elbow.  4. Sit quietly,  with the cuff in place, for about 5 minutes. After that 5 minutes press the power  button to start a reading. 5. Do not talk or move while the reading is taking place.  6. Record your readings on a sheet of paper. Although most cuffs have a memory, it is often  easier to see a pattern developing when the numbers are all in front of you.  7. You can repeat the reading after 1-3 minutes if it is recommended  Make sure your bladder is empty and you have not had caffeine or tobacco within the last 30 min  Always bring your blood pressure log with you to your appointments. If you have not brought your monitor in to be double checked for accuracy, please bring it to your next appointment.  You can find a list of quality blood pressure cuffs at validatebp.org

## 2022-07-23 NOTE — Assessment & Plan Note (Signed)
Assessment: BP is uncontrolled in office BP 153/81 mmHg;  above the goal (<130/80). Patient not taking olmesartan and maybe amlodipine 2/2 confusion about orders Denies SOB, palpitation, chest pain, headaches,or swelling Reiterated the importance of regular exercise and low salt diet   Plan:  Patient should be taking taking olmesartan, amlodipine AND diltiazem Explained differences in CCB and why okay to take both.  Patient to keep record of BP readings with heart rate and report to [place] at the next visit Patient to follow up with PharmD in 6 weeks for follow up  Labs ordered today:  none

## 2022-07-23 NOTE — Assessment & Plan Note (Deleted)
Assessment: BP is un/controlled in office BP *** mmHg;  above the goal (<130/80).  Tolerates *** well without any side effects  Denies SOB, palpitation, chest pain, headaches,or swelling  Reiterated the importance of regular exercise and low salt diet   Plan:  Stop taking Start taking Continue taking  Patient to keep record of BP readings with heart rate and report to [place] at the next visit Patient to follow up with *** in *** for follow up  Labs ordered today:

## 2022-07-23 NOTE — Telephone Encounter (Signed)
*  STAT* If patient is at the pharmacy, call can be transferred to refill team.   1. Which medications need to be refilled? (please list name of each medication and dose if known)   olmesartan (BENICAR) 40 MG tablet   2. Which pharmacy/location (including street and city if local pharmacy) is medication to be sent to?  [facility] ([place] [place] - [address]   3. Do they need a 30 day or 90 day supply?   90 day  Wife stated pharmacy told her they did not have the patient's new prescription for this medication.  Wife wants new prescription re-sent to pharmacy.

## 2022-07-24 ENCOUNTER — Other Ambulatory Visit: Payer: Self-pay

## 2022-07-24 ENCOUNTER — Encounter (HOSPITAL_COMMUNITY): Payer: Self-pay | Admitting: Gastroenterology

## 2022-07-24 ENCOUNTER — Ambulatory Visit (HOSPITAL_COMMUNITY)
Admission: RE | Admit: 2022-07-24 | Discharge: 2022-07-24 | Disposition: A | Discharge status: Home / Self Care | Payer: Medicare Other | Source: Ambulatory Visit | Attending: Gastroenterology | Admitting: Gastroenterology

## 2022-07-24 ENCOUNTER — Encounter (HOSPITAL_COMMUNITY)
Admission: RE | Disposition: A | Discharge status: Home / Self Care | Payer: Self-pay | Source: Ambulatory Visit | Attending: Gastroenterology

## 2022-07-24 ENCOUNTER — Ambulatory Visit (HOSPITAL_COMMUNITY): Payer: Medicare Other | Admitting: Anesthesiology

## 2022-07-24 ENCOUNTER — Ambulatory Visit (HOSPITAL_BASED_OUTPATIENT_CLINIC_OR_DEPARTMENT_OTHER): Payer: Medicare Other | Admitting: Anesthesiology

## 2022-07-24 DIAGNOSIS — K648 Other hemorrhoids: Secondary | ICD-10-CM | POA: Diagnosis not present

## 2022-07-24 DIAGNOSIS — Z09 Encounter for follow-up examination after completed treatment for conditions other than malignant neoplasm: Secondary | ICD-10-CM | POA: Insufficient documentation

## 2022-07-24 DIAGNOSIS — D122 Benign neoplasm of ascending colon: Secondary | ICD-10-CM | POA: Diagnosis not present

## 2022-07-24 DIAGNOSIS — Z87891 Personal history of nicotine dependence: Secondary | ICD-10-CM

## 2022-07-24 DIAGNOSIS — D500 Iron deficiency anemia secondary to blood loss (chronic): Secondary | ICD-10-CM | POA: Insufficient documentation

## 2022-07-24 DIAGNOSIS — D123 Benign neoplasm of transverse colon: Secondary | ICD-10-CM | POA: Insufficient documentation

## 2022-07-24 DIAGNOSIS — D120 Benign neoplasm of cecum: Secondary | ICD-10-CM

## 2022-07-24 DIAGNOSIS — I10 Essential (primary) hypertension: Secondary | ICD-10-CM | POA: Insufficient documentation

## 2022-07-24 DIAGNOSIS — D509 Iron deficiency anemia, unspecified: Secondary | ICD-10-CM | POA: Diagnosis not present

## 2022-07-24 DIAGNOSIS — J449 Chronic obstructive pulmonary disease, unspecified: Secondary | ICD-10-CM | POA: Insufficient documentation

## 2022-07-24 DIAGNOSIS — K635 Polyp of colon: Secondary | ICD-10-CM | POA: Diagnosis not present

## 2022-07-24 HISTORY — PX: COLONOSCOPY WITH PROPOFOL: SHX5780

## 2022-07-24 HISTORY — PX: POLYPECTOMY: SHX5525

## 2022-07-24 SURGERY — COLONOSCOPY WITH PROPOFOL
Anesthesia: Monitor Anesthesia Care

## 2022-07-24 MED ORDER — SODIUM CHLORIDE 0.9 % IV SOLN: INTRAVENOUS | Status: DC

## 2022-07-24 MED ORDER — LIDOCAINE 2% (20 MG/ML) 5 ML SYRINGE
INTRAMUSCULAR | Status: DC | PRN
  Administered 2022-07-24: 100 mg via INTRAVENOUS

## 2022-07-24 MED ORDER — PROPOFOL 10 MG/ML IV BOLUS
INTRAVENOUS | Status: DC | PRN
  Administered 2022-07-24 (×2): 25 mg via INTRAVENOUS
  Administered 2022-07-24: 50 mg via INTRAVENOUS

## 2022-07-24 MED ORDER — LACTATED RINGERS IV SOLN: INTRAVENOUS | Status: DC | PRN

## 2022-07-24 MED ORDER — PROPOFOL 500 MG/50ML IV EMUL
INTRAVENOUS | Status: DC | PRN
  Administered 2022-07-24: 150 ug/kg/min via INTRAVENOUS

## 2022-07-24 SURGICAL SUPPLY — 22 items

## 2022-07-24 NOTE — Anesthesia Preprocedure Evaluation (Signed)
Anesthesia Evaluation  Patient identified by MRN, date of birth, ID band Patient awake    Reviewed: Allergy & Precautions, H&P , NPO status , Patient's Chart, lab work & pertinent test results  Airway Mallampati: II  TM Distance: >3 FB Neck ROM: Full    Dental no notable dental hx.    Pulmonary COPD,  COPD inhaler, former smoker   Pulmonary exam normal breath sounds clear to auscultation       Cardiovascular hypertension, Normal cardiovascular exam+ dysrhythmias Atrial Fibrillation  Rhythm:Regular Rate:Normal     Neuro/Psych negative neurological ROS  negative psych ROS   GI/Hepatic negative GI ROS, Neg liver ROS,,,  Endo/Other  negative endocrine ROS    Renal/GU negative Renal ROS  negative genitourinary   Musculoskeletal negative musculoskeletal ROS (+)    Abdominal   Peds negative pediatric ROS (+)  Hematology  (+) Blood dyscrasia, anemia   Anesthesia Other Findings   Reproductive/Obstetrics negative OB ROS                             Anesthesia Physical Anesthesia Plan  ASA: 3  Anesthesia Plan: MAC   Post-op Pain Management: Minimal or no pain anticipated   Induction: Intravenous  PONV Risk Score and Plan: 1 and Propofol infusion and Treatment may vary due to age or medical condition  Airway Management Planned: Simple Face Mask  Additional Equipment:   Intra-op Plan:   Post-operative Plan:   Informed Consent: I have reviewed the patients History and Physical, chart, labs and discussed the procedure including the risks, benefits and alternatives for the proposed anesthesia with the patient or authorized representative who has indicated his/her understanding and acceptance.     Dental advisory given  Plan Discussed with: CRNA and Surgeon  Anesthesia Plan Comments:        Anesthesia Quick Evaluation

## 2022-07-24 NOTE — Anesthesia Procedure Notes (Signed)
Procedure Name: MAC Date/Time: 07/24/2022 12:35 PM  Performed by: Renato Shin, CRNAPre-anesthesia Checklist: Patient identified, Emergency Drugs available, Suction available and Patient being monitored Patient Re-evaluated:Patient Re-evaluated prior to induction Oxygen Delivery Method: Nasal cannula Preoxygenation: Pre-oxygenation with 100% oxygen Induction Type: IV induction Placement Confirmation: positive ETCO2 and breath sounds checked- equal and bilateral Dental Injury: Teeth and Oropharynx as per pre-operative assessment

## 2022-07-24 NOTE — H&P (Signed)
HPI: 61 male with persistent IDA. Capsule endoscopy from 06/24/2022 showed normal SB, probable AVM in cecum with oozing.  Vital Signs Wt: 177.8, Wt change: 3.6 lbs, Ht: 72, BMI: 24.11, Temp: 97.9, Pulse sitting: 57, BP sitting: 129/66 1st BP 142/70 P: 56 11: 23 AM 2nd BP 129/66 P: 57 11: 25 AM. Examination Gastroenterology:: GENERAL APPEARANCE: well-developed well-nourished in no acute distress .  EYES: sclera nonicteric .  ABDOMEN soft nontender without guarding or rebound .  EXTREMITIES: no pedal edema.    Current Medications Taking Eliquis(Apixaban) 5 MG Tablet 1 tablet Orally Twice a day dilTIAZem HCl ER 120 MG Capsule Extended Release 12 Hour 1 capsule Orally once a day Breztri Aerosphere(Budeson-Glycopyrrol-Formoterol) 160-9-4.8 MCG/ACT Aerosol 2 puffs Inhalation Twice a day , Notes to Pharmacist: verify directions Albuterol Sulfate HFA 108 (90 Base) MCG/ACT Aerosol Solution 2 puff as needed Inhalation every 6 hrs, as needed , Notes to Pharmacist: Please do not fill ,verify directionstoday. Put on file and fill when due or upon request Timolol Hemihydrate 0.25 % Solution 1 drop into affected eye Ophthalmic Once a day Elderberry 500 MG Capsule 1 capsule by mouth once a day Fish Oil 1000 MG Capsule 2 capsules with a meal Orally Once a day Latanoprost 0.005 % Solution 1 drop into affected eye in the evening Ophthalmic Once a day Vitamin C 500 MG Capsule 1 tablet Orally Once a day Sildenafil Citrate 100 MG Tablet 1 table by mouth as needed , Notes to Pharmacist: Please do not fill today. Put on file and fill when due or upon request Pantoprazole Sodium 40 MG Tablet Delayed Release 1 tablet Orally Once a day Tylenol Extra Strength 500 MG Tablet 2 tablets Orally as needed Unisom Sleepgels(diphenhydrAMINE HCl (Sleep)) 50 MG Capsule 1 Capsule Orally Once a day Discontinued amLODIPine Besylate 10 MG Tablet 1 tablet Orally Once a day , Notes to Pharmacist: Please do not fill today. Put on  file and fill when due or upon request Doxazosin Mesylate 4 MG Tablet 1/2 tablet Orally at bedtime Benzonatate 200 MG Capsule 1 capsule Orally Three times a day Ipratropium Bromide 0.06 % Solution 2 sprays in each nostril Nasally four times a day Olmesartan Medoxomil 40 MG Tablet 1 tablet Orally Once a day , Notes to Pharmacist: Please do not fill today. Put on file and fill when due or upon request Medication List reviewed and reconciled with the patient  Past Medical History      hypertension-[facility].      ED.      Allergic rhinitis.      Current smoker.      Pain in joint, shoulder region.      Essential hypertension.      Mixed hyperlipidemia.      Tobacco dependence.      Erectile dysfunction.  Surgical History       knee surgery (L)       oral       cysto       colonoscopy 2014,2022       bilateral cataract       glaucoma  Family History Father: deceased 19 yrs, Alzheimer's dementia 06/2021 passed, diagnosed with Prostate CA Mother: deceased 53 yrs, cause of death - kidney, diagnosed with Diabetes, Hypertension Paternal [place] Father: deceased Paternal [place] Mother: deceased Maternal Grand Father: deceased Maternal Grand Mother: deceased Brother 1: alive Sister 1: alive, diagnosed with Hypertension 1 brother(s) , 1 sister(s) . 2 son(s) . no hx of liver diease or polyps.  Social History General:  Tobacco use  cigarettes:  Uses tobacco in other forms 1-2 cigars per week, Tobacco history last updated  05/16/2022, Vaping  No. Alcohol: none. Recreational drug use: no. Exercise: Walking daily. Marital Status: married. Children: 2, son (s), Thurmond Butts and Nicklaus. 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 Religion: Baptist. Seat belt use: always. Pt has been trying to quit smoking.  Allergies  Lisinopril: Cough - Side Effects Losartan Potassium-HCTZ: erectile dysfunction - Side Effects Codeine Sulfate: pale skin, nausea,  vomiting - Side Effects Streptomycin Sulfate: Childhood - Allergy Hospitalization/Major Diagnostic Procedure see surgeries not in the past year 11/2021 [facility] - Pneumonia + AFIB 04/22/22 -05/01/22  Review of Systems GI PROCEDURE:         Pacemaker/ AICD no.  Artificial heart valves no.  MI/heart attack no.  Abnormal heart rhythm YES, Atrial Fibrillation.  Angina no.  CVA no.  Hypertension YES.  Hypotension no.  Asthma, COPD no.  Sleep apnea no.  Seizure disorders no.  Artificial joints no.  Diabetes no.  Significant headaches no.  Vertigo no.  Depression/anxiety no.  Abnormal bleeding no.  Kidney Disease no.  Liver disease no.  Blood transfusion no  Assessments 1. Iron deficiency anemia due to chronic blood loss - D50.0 (Primary)  2. Abnormal SB capsule endoscopy, ?cecal AVM  Plan to proceed with a Colonoscopy at the hospital, may need APC.

## 2022-07-24 NOTE — Op Note (Signed)
[facility] Patient Name: Kenneth Weber Procedure Date: 07/24/2022 MRN: [id number] Attending MD: Ronnette Juniper , MD, [id number] Date of Birth: [birth year] CSN: [id number] Age: 72 Admit Type: Inpatient Procedure:                Colonoscopy Indications:              Last colonoscopy: 2022, Unexplained iron deficiency                            anemia, abnormal capusle endoscopy, ?possible Cecal                            AVM?oozing in right colon Providers:                Ronnette Juniper, MD, William Dalton, Technician,                            Katina Degree, RN Referring MD:             Carola Frost Medicines:                Monitored Anesthesia Care Complications:            No immediate complications. Estimated blood loss:                            Minimal. Estimated Blood Loss:     Estimated blood loss was minimal. Procedure:                Pre-Anesthesia Assessment:                           - Prior to the procedure, a History and Physical                            was performed, and patient medications and                            allergies were reviewed. The patient's tolerance of                            previous anesthesia was also reviewed. The risks                            and benefits of the procedure and the sedation                            options and risks were discussed with the patient.                            All questions were answered, and informed consent                            was obtained. Prior Anticoagulants: The patient has                            taken Eliquis (apixaban), last  dose was 3 days                            prior to procedure. ASA Grade Assessment: III - A                            patient with severe systemic disease. After                            reviewing the risks and benefits, the patient was                            deemed in satisfactory condition to undergo the                             procedure.                           After obtaining informed consent, the colonoscope                            was passed under direct vision. Throughout the                            procedure, the patient's blood pressure, pulse, and                            oxygen saturations were monitored continuously. The                            [id number]) Olympus colonoscope was                            introduced through the anus and advanced to the the                            terminal ileum. The colonoscopy was performed                            without difficulty. The patient tolerated the                            procedure well. The quality of the bowel                            preparation was fair. The terminal ileum, ileocecal                            valve, appendiceal orifice, and rectum were                            photographed. Scope In: 12:36:22 PM Scope Out: 1:00:21 PM Scope Withdrawal Time: 0 hours 19 minutes 43 seconds  Total Procedure Duration: 0 hours 23 minutes 59 seconds  Findings:  The perianal and digital rectal examinations were normal.      The terminal ileum appeared normal.      Three sessile polyps were found in the cecum. The polyps were 3 to 6 mm       in size. These polyps were removed with a piecemeal technique using a       cold biopsy forceps. Resection and retrieval were complete.      Two sessile polyps were found in the ascending colon. The polyps were 4       to 12 mm in size. These polyps were removed with a hot snare. Resection       and retrieval were complete.      A 5 mm polyp was found in the transverse colon. The polyp was sessile.       The polyp was removed with a piecemeal technique using a cold biopsy       forceps. Resection and retrieval were complete.      A moderate amount of liquid stool was found in the descending colon, at       the splenic flexure, in the transverse colon, at the hepatic flexure, in        the ascending colon and in the cecum, making visualization difficult.       Lavage of the area was performed, resulting in clearance with fair       visualization.      Non-bleeding internal hemorrhoids were found during retroflexion. Impression:               - Preparation of the colon was fair.                           - The examined portion of the ileum was normal.                           - Three 3 to 6 mm polyps in the cecum, removed                            piecemeal using a cold biopsy forceps. Resected and                            retrieved.                           - Two 4 to 12 mm polyps in the ascending colon,                            removed with a hot snare. Resected and retrieved.                           - One 5 mm polyp in the transverse colon, removed                            piecemeal using a cold biopsy forceps. Resected and                            retrieved.                           -  Stool in the descending colon, at the splenic                            flexure, in the transverse colon, at the hepatic                            flexure, in the ascending colon and in the cecum.                           - Non-bleeding internal hemorrhoids. Moderate Sedation:      Patient did not receive moderate sedation for this procedure, but       instead received monitored anesthesia care. Recommendation:           - Patient has a contact number available for                            emergencies. The signs and symptoms of potential                            delayed complications were discussed with the                            patient. Return to normal activities tomorrow.                            Written discharge instructions were provided to the                            patient.                           - Resume regular diet.                           - Continue present medications.                           - Await pathology results.                            - Repeat colonoscopy for surveillance based on                            pathology results.                           - Resume Eliquis (apixaban) at prior dose tomorrow. Procedure Code(s):        --- Professional ---                           [phone], Colonoscopy, flexible; with removal of                            tumor(s), polyp(s), or other lesion(s) by snare  technique                           [id number], 59, Colonoscopy, flexible; with biopsy,                            single or multiple Diagnosis Code(s):        --- Professional ---                           K64.8, Other hemorrhoids                           D12.0, Benign neoplasm of cecum                           D12.2, Benign neoplasm of ascending colon                           D12.3, Benign neoplasm of transverse colon (hepatic                            flexure or splenic flexure)                           D50.9, Iron deficiency anemia, unspecified CPT copyright 2022 American [employer]. All rights reserved. The codes documented in this report are preliminary and upon coder review may  be revised to meet current compliance requirements. Ronnette Juniper, MD 07/24/2022 1:19:28 PM This report has been signed electronically. Number of Addenda: 0

## 2022-07-24 NOTE — Transfer of Care (Signed)
Immediate Anesthesia Transfer of Care Note  Patient: Kenneth Weber  Procedure(s) Performed: COLONOSCOPY WITH PROPOFOL POLYPECTOMY  Patient Location: PACU and Endoscopy Unit  Anesthesia Type:MAC  Level of Consciousness: drowsy and patient cooperative  Airway & Oxygen Therapy: Patient Spontanous Breathing and Patient connected to nasal cannula oxygen  Post-op Assessment: Report given to RN and Post -op Vital signs reviewed and stable  Post vital signs: Reviewed and stable  Last Vitals:  Vitals Value Taken Time  BP    Temp    Pulse 55 07/24/22 1306  Resp 20 07/24/22 1306  SpO2 100 % 07/24/22 1306  Vitals shown include unvalidated device data.  Last Pain:  Vitals:   07/24/22 1020  TempSrc: Temporal  PainSc: 0-No pain         Complications: No notable events documented.

## 2022-07-24 NOTE — Discharge Instructions (Signed)

## 2022-07-24 NOTE — Anesthesia Postprocedure Evaluation (Signed)
Anesthesia Post Note  Patient: [name]  Procedure(s) Performed: COLONOSCOPY WITH PROPOFOL POLYPECTOMY     Patient location during evaluation: PACU Anesthesia Type: MAC Level of consciousness: awake and alert Pain management: pain level controlled Vital Signs Assessment: post-procedure vital signs reviewed and stable Respiratory status: spontaneous breathing, nonlabored ventilation, respiratory function stable and patient connected to nasal cannula oxygen Cardiovascular status: stable and blood pressure returned to baseline Postop Assessment: no apparent nausea or vomiting Anesthetic complications: no  No notable events documented.  Last Vitals:  Vitals:   07/24/22 1020 07/24/22 1305  BP: (!) 151/83 (!) 97/52  Pulse: (!) 55 (!) 56  Resp: 15   Temp: (!) 36.2 C (!) 36.1 C  SpO2: 99% 100%    Last Pain:  Vitals:   07/24/22 1305  TempSrc: Temporal  PainSc: 0-No pain                 Tegh Franek S

## 2022-07-25 ENCOUNTER — Encounter (HOSPITAL_COMMUNITY): Payer: Self-pay | Admitting: Gastroenterology

## 2022-07-26 LAB — SURGICAL PATHOLOGY

## 2022-08-05 ENCOUNTER — Encounter (HOSPITAL_BASED_OUTPATIENT_CLINIC_OR_DEPARTMENT_OTHER): Payer: Medicare Other

## 2022-08-07 ENCOUNTER — Ambulatory Visit (INDEPENDENT_AMBULATORY_CARE_PROVIDER_SITE_OTHER): Payer: Medicare Other | Admitting: Orthopaedic Surgery

## 2022-08-07 ENCOUNTER — Encounter: Payer: Self-pay | Admitting: Orthopaedic Surgery

## 2022-08-07 DIAGNOSIS — M7062 Trochanteric bursitis, left hip: Secondary | ICD-10-CM | POA: Diagnosis not present

## 2022-08-07 DIAGNOSIS — M7061 Trochanteric bursitis, right hip: Secondary | ICD-10-CM | POA: Diagnosis not present

## 2022-08-07 DIAGNOSIS — M25552 Pain in left hip: Secondary | ICD-10-CM

## 2022-08-07 DIAGNOSIS — M25551 Pain in right hip: Secondary | ICD-10-CM | POA: Diagnosis not present

## 2022-08-07 MED ORDER — METHYLPREDNISOLONE ACETATE 40 MG/ML IJ SUSP
40.0000 mg | INTRAMUSCULAR | Status: AC | PRN
  Administered 2022-08-07: 40 mg via INTRA_ARTICULAR

## 2022-08-07 MED ORDER — LIDOCAINE HCL 1 % IJ SOLN
3.0000 mL | INTRAMUSCULAR | Status: AC | PRN
  Administered 2022-08-07: 3 mL

## 2022-08-07 NOTE — Progress Notes (Signed)
The patient is someone I saw a year ago for left hip trochanteric bursitis.  He is 72 years old.  A steroid injection over the trochanteric area helped him quite a bit.  He is really started having some pain just in the last month and would like to have a steroid injection on his left side and even his right side today.  He denies any groin pain but he does feel like he is much older given the pain that he has.  On exam both hips move smoothly and fluidly.  He has pain over the trochanteric area of both hips.  I did provide a steroid injection over both hip trochanteric areas which he tolerated well.  Follow-up is as needed.  If it is not getting better he knows to come let us know.      Procedure Note  Patient: Kenneth Weber             Date of Birth: 1950-07-15           MRN: [id number]             Visit Date: 08/07/2022  Procedures: Visit Diagnoses:  1. Pain in left hip   2. Trochanteric bursitis, left hip   3. Pain of right hip   4. Trochanteric bursitis, right hip     Large Joint Inj: R greater trochanter on 08/07/2022 3:10 PM Indications: pain and diagnostic evaluation Details: 22 G 1.5 in needle, lateral approach  Arthrogram: No  Medications: 3 mL lidocaine 1 %; 40 mg methylPREDNISolone acetate 40 MG/ML Outcome: tolerated well, no immediate complications Procedure, treatment alternatives, risks and benefits explained, specific risks discussed. Consent was given by the patient. Immediately prior to procedure a time out was called to verify the correct patient, procedure, equipment, support staff and site/side marked as required. Patient was prepped and draped in the usual sterile fashion.    Large Joint Inj: L greater trochanter on 08/07/2022 3:10 PM Indications: pain and diagnostic evaluation Details: 22 G 1.5 in needle, lateral approach  Arthrogram: No  Medications: 3 mL lidocaine 1 %; 40 mg methylPREDNISolone acetate 40 MG/ML Outcome: tolerated well, no immediate  complications Procedure, treatment alternatives, risks and benefits explained, specific risks discussed. Consent was given by the patient. Immediately prior to procedure a time out was called to verify the correct patient, procedure, equipment, support staff and site/side marked as required. Patient was prepped and draped in the usual sterile fashion.

## 2022-08-14 ENCOUNTER — Other Ambulatory Visit: Payer: Self-pay | Admitting: *Deleted

## 2022-08-14 DIAGNOSIS — Z122 Encounter for screening for malignant neoplasm of respiratory organs: Secondary | ICD-10-CM

## 2022-08-14 DIAGNOSIS — Z87891 Personal history of nicotine dependence: Secondary | ICD-10-CM

## 2022-08-27 ENCOUNTER — Ambulatory Visit
Payer: Medicare Other | Attending: Internal Medicine | Admitting: Pharmacist Clinician (PhC)/ Clinical Pharmacy Specialist

## 2022-08-27 ENCOUNTER — Encounter: Payer: Self-pay | Admitting: Pharmacist Clinician (PhC)/ Clinical Pharmacy Specialist

## 2022-08-27 VITALS — BP 124/71 | HR 58

## 2022-08-27 DIAGNOSIS — I10 Essential (primary) hypertension: Secondary | ICD-10-CM

## 2022-08-27 NOTE — Progress Notes (Signed)
Office Visit    Patient Name: Kenneth Weber Date of Encounter: 08/27/2022  Primary Care Provider:  [name], MD Primary Cardiologist:  Reatha Harps, MD  Chief Complaint    Hypertension - Advanced hypertension clinic  Past Medical History   AFib CHADS2-VASc = 2 (age, htn)  COPD On Breztri bid    Allergies  Allergen Reactions   Other    Streptogramins    Codeine Nausea And Vomiting   Lisinopril Cough   Losartan Potassium-Hctz Other (See Comments)    Erectile Dysfunction    History of Present Illness    Kenneth Weber is a 72 y.o. male patient who was referred to the Advanced Hypertension Clinic by Bettina Gavia PA.  He was seen in the [facility] office on Feb 6 with a BP of 186/90 and olmesartan 40 mg daily was started.  He was able to get in with Dr. Duke Salvia just a week later.  At the time he was reporting home BP readings up to 188/104.  She had him re-start amlodipine 5 gm (had been held on hospital discharge) and start home monitoring.  He was agreeable to join the [employer] and randomized to group 1.  At his first follow up visit there was confusion about what medications he was taking.  He thought he had been told to stop olmesartan and was told not to take both amlodipine and diltiazem.  Explained that he should be on all 3, discussed differences in the CCB's and scheduled follow up in 1 month.   Today he is in the office for follow up.  No complaints today, states feeling well and is rather pleased with the drop he has seen in home BP readings.      Blood Pressure Goal:  130/80  Current Medications: amlodipine 5 mg qd (??), diltiazem 120 mg qd, olmesartan 40 mg qd (??)  Adherence Assessment  Do you ever forget to take your medication? Yes No  Do you ever skip doses due to side effects? Yes No  Do you have trouble affording your medicines? Yes No  Are you ever unable to pick up your medication due to transportation difficulties?  Yes No  Do you ever stop taking your medications because you don't believe they are helping? Yes No  Do you check your weight daily? Yes No   Adherence strategy: 7 day pill minder  Previously tried:   Lisinopril - cough        Losartan/HCTZ - ED  Family Hx:   CABG x 3 in dad at 108 died last year at 55 dementia, colon cancer - died cancer free; mother with DM, died at 86; sister has anxieties; brother with CABG x 2 at 11, doing well now; 1 son no heart issues - fireman  Social Hx:      Tobacco: former smoker - 60 pack year history, quit 2015  Alcohol: no  Caffeine:  1 coffee per day,   4-5 bottles of water daily  Diet: eats out regularly - doesn't add salt, doesn't do fast food excpet rarely; lots of chicken and salads     Exercise: 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,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  Home BP readings:    19 AM readings:  average 124/70 HR 59  (range 107-161/WNL) 18 PM readings:  average 131/70 HR 63  (range 101-161/65-86)   Accessory Clinical Findings    Lab Results  Component Value Date   CREATININE 1.23 05/01/2022   BUN  27 (H) 05/01/2022   NA 137 05/01/2022   K 4.6 05/01/2022   CL 104 05/01/2022   CO2 27 05/01/2022   Lab Results  Component Value Date   ALT 17 04/22/2022   AST 19 04/22/2022   ALKPHOS 76 04/22/2022   BILITOT 1.0 04/22/2022   No results found for: "HGBA1C"  Screening for Secondary Hypertension:      06/19/2022   10:14 AM  Causes  Drugs/Herbals Screened     - Comments no added salt.  1 coffee.  No EtOh,  no illicits  Sleep Apnea Screened     - Comments snores only on back.  no other symptoms  Thyroid Disease Screened  Hyperaldosteronism Screened     - Comments check renin and aldosterone  Pheochromocytoma Screened  Cushing's Syndrome N/A  Hyperparathyroidism Screened    Relevant Labs/Studies:    Latest Ref Rng & Units 05/01/2022    2:52 AM 04/29/2022    3:04 AM 04/28/2022   10:23 AM  Basic Labs   Sodium 135 - 145 mmol/L 137  138  137   Potassium 3.5 - 5.1 mmol/L 4.6  4.6  3.9   Creatinine 0.61 - 1.24 mg/dL 1.61  0.96  0.45        Latest Ref Rng & Units 04/25/2022    4:12 AM  Thyroid   TSH 0.350 - 4.500 uIU/mL 0.158        Latest Ref Rng & Units 06/19/2022   11:19 AM  Renin/Aldosterone   Aldosterone 0.0 - 30.0 ng/dL 2.9   Aldos/Renin Ratio 0.0 - 30.0 2.0        Latest Ref Rng & Units 06/19/2022   11:19 AM  Metanephrines/Catecholamines   Epinephrine 0 - 62 pg/mL 39   Norepinephrine 0 - 874 pg/mL 887   Dopamine 0 - 48 pg/mL <30   Metanephrines 0.0 - 88.0 pg/mL <25.0   Normetanephrines  0.0 - 285.2 pg/mL 87.2           06/19/2022   10:51 AM  Renovascular   Renal Artery [place] Completed Yes      Home Medications    Current Outpatient Medications  Medication Sig Dispense Refill   acetaminophen (TYLENOL) 500 MG tablet Take 1,000 mg by mouth every 6 (six) hours as needed for headache.     albuterol (VENTOLIN HFA) 108 (90 Base) MCG/ACT inhaler Up to 2 puffs every 4 hours as needed (Patient taking differently: Inhale 1-2 puffs into the lungs every 6 (six) hours as needed for shortness of breath. Up to 2 puffs every 4 hours as needed) 1 each 11   amLODipine (NORVASC) 5 MG tablet Take 1 tablet (5 mg total) by mouth daily. 90 tablet 3   apixaban (ELIQUIS) 5 MG TABS tablet Take 1 tablet (5 mg total) by mouth 2 (two) times daily. 60 tablet 3   ascorbic acid (VITAMIN C) 500 MG tablet Take 500 mg by mouth daily.     Budeson-Glycopyrrol-Formoterol (BREZTRI AEROSPHERE) 160-9-4.8 MCG/ACT AERO Take 2 puffs first thing in am and then another 2 puffs about 12 hours later. (Patient taking differently: Inhale 2 puffs into the lungs 2 (two) times daily. Take 2 puffs first thing in am and then another 2 puffs about 12 hours later.) 10.7 g 11   diltiazem (CARDIZEM CD) 120 MG 24 hr capsule Take 1 capsule (120 mg total) by mouth daily. 30 capsule 3   doxylamine, Sleep, (UNISOM) 25 MG tablet Take  25 mg by mouth at bedtime  as needed for sleep.     Elderberry 500 MG CAPS Take 500 mg by mouth daily.     latanoprost (XALATAN) 0.005 % ophthalmic solution Place 1 drop into both eyes at bedtime.     olmesartan (BENICAR) 40 MG tablet Take 1 tablet (40 mg total) by mouth daily. 90 tablet 3   Omega-3 Fatty Acids (FISH OIL) 1000 MG CAPS Take 2 capsules by mouth daily.     timolol (TIMOPTIC) 0.5 % ophthalmic solution Place 1 drop into both eyes every morning.     No current facility-administered medications for this visit.     Assessment & Plan       Hypertension Assessment: BP is controlled in office BP 124/71 mmHg;   Tolerates current medications well without any side effects Denies SOB, palpitation, chest pain, headaches,or swelling Reiterated the importance of regular exercise and low salt diet   Plan:  Continue taking current medications Patient to keep record of BP readings with heart rate and report to [place] at the next visit Patient to follow up with Belenda Cruise in 1 month - [facility] - 3rd visit.  Labs ordered today:  none   Phillips Hay PharmD CPP [facility]  [address] [place], [place] [address] [phone]

## 2022-08-27 NOTE — Assessment & Plan Note (Signed)
Assessment: BP is controlled in office BP 124/71 mmHg;   Tolerates current medications well without any side effects Denies SOB, palpitation, chest pain, headaches,or swelling Reiterated the importance of regular exercise and low salt diet   Plan:  Continue taking current medications Patient to keep record of BP readings with heart rate and report to [place] at the next visit Patient to follow up with Belenda Cruise in 1 month - [facility] - 3rd visit.  Labs ordered today:  none

## 2022-08-27 NOTE — Patient Instructions (Signed)
   Take your BP meds as follows: NO CHANGES TO YOUR MEDICATION  Check your blood pressure at home daily (if able) and keep record of the readings.  Hypertension "High blood pressure"  Hypertension is often called "The Silent Killer." It rarely causes symptoms until it is extremely  high or has done damage to other organs in the body. For this reason, you should have your  blood pressure checked regularly by your physician. We will check your blood pressure  every time you see a provider at one of our offices.   Your blood pressure reading consists of two numbers. Ideally, blood pressure should be  below 120/80. The first ("top") number is called the systolic pressure. It measures the  pressure in your arteries as your heart beats. The second ("bottom") number is called the diastolic pressure. It measures the pressure in your arteries as the heart relaxes between beats.  The benefits of getting your blood pressure under control are enormous. A 10-point  reduction in systolic blood pressure can reduce your risk of stroke by 27% and heart failure by 28%  Your blood pressure goal is < 130/80  To check your pressure at home you will need to:  1. Sit up in a chair, with feet flat on the floor and back supported. Do not cross your ankles or legs. 2. Rest your left arm so that the cuff is about heart level. If the cuff goes on your upper arm,  then just relax the arm on the table, arm of the chair or your lap. If you have a wrist cuff, we  suggest relaxing your wrist against your chest (think of it as Pledging the Flag with the  wrong arm).  3. Place the cuff snugly around your arm, about 1 inch above the crook of your elbow. The  cords should be inside the groove of your elbow.  4. Sit quietly, with the cuff in place, for about 5 minutes. After that 5 minutes press the power  button to start a reading. 5. Do not talk or move while the reading is taking place.  6. Record your readings on  a sheet of paper. Although most cuffs have a memory, it is often  easier to see a pattern developing when the numbers are all in front of you.  7. You can repeat the reading after 1-3 minutes if it is recommended  Make sure your bladder is empty and you have not had caffeine or tobacco within the last 30 min  Always bring your blood pressure log with you to your appointments. If you have not brought your monitor in to be double checked for accuracy, please bring it to your next appointment.  You can find a list of quality blood pressure cuffs at validatebp.org

## 2022-08-28 ENCOUNTER — Telehealth (HOSPITAL_BASED_OUTPATIENT_CLINIC_OR_DEPARTMENT_OTHER): Payer: Self-pay | Admitting: Cardiovascular Disease

## 2022-08-28 NOTE — Telephone Encounter (Signed)
Spoke with patient regarding schedule change for the 09/02/22 renal artery duplex  ---rescheduled to 09/25/22 at 11:00 am.  Will mail new information to patient and he voiced his understanding.

## 2022-08-29 ENCOUNTER — Encounter (HOSPITAL_BASED_OUTPATIENT_CLINIC_OR_DEPARTMENT_OTHER): Payer: Medicare Other

## 2022-08-29 ENCOUNTER — Ambulatory Visit (HOSPITAL_BASED_OUTPATIENT_CLINIC_OR_DEPARTMENT_OTHER): Payer: Medicare Other

## 2022-09-02 ENCOUNTER — Other Ambulatory Visit: Payer: Self-pay | Admitting: Cardiovascular Disease

## 2022-09-02 ENCOUNTER — Encounter (HOSPITAL_BASED_OUTPATIENT_CLINIC_OR_DEPARTMENT_OTHER): Payer: Medicare Other

## 2022-09-02 NOTE — Telephone Encounter (Signed)
Pt last saw Dr Duke Salvia 06/19/22, last labs 05/01/22 Creat 1.23, age 72, weight 85.3kg, based on specified criteria pt is on appropriate dosage of Eliquis 5mg  BID for afib.  Will refill rx.

## 2022-09-23 DIAGNOSIS — E349 Endocrine disorder, unspecified: Secondary | ICD-10-CM | POA: Diagnosis not present

## 2022-09-24 ENCOUNTER — Other Ambulatory Visit: Payer: Self-pay | Admitting: Family Medicine

## 2022-09-24 DIAGNOSIS — D500 Iron deficiency anemia secondary to blood loss (chronic): Secondary | ICD-10-CM | POA: Diagnosis not present

## 2022-09-24 DIAGNOSIS — J439 Emphysema, unspecified: Secondary | ICD-10-CM | POA: Diagnosis not present

## 2022-09-24 DIAGNOSIS — E7800 Pure hypercholesterolemia, unspecified: Secondary | ICD-10-CM | POA: Diagnosis not present

## 2022-09-24 DIAGNOSIS — I700 Atherosclerosis of aorta: Secondary | ICD-10-CM | POA: Diagnosis not present

## 2022-09-24 DIAGNOSIS — D6869 Other thrombophilia: Secondary | ICD-10-CM | POA: Diagnosis not present

## 2022-09-24 DIAGNOSIS — Z0000 Encounter for general adult medical examination without abnormal findings: Secondary | ICD-10-CM | POA: Diagnosis not present

## 2022-09-24 DIAGNOSIS — R7303 Prediabetes: Secondary | ICD-10-CM | POA: Diagnosis not present

## 2022-09-24 DIAGNOSIS — Z136 Encounter for screening for cardiovascular disorders: Secondary | ICD-10-CM

## 2022-09-24 DIAGNOSIS — I4891 Unspecified atrial fibrillation: Secondary | ICD-10-CM | POA: Diagnosis not present

## 2022-09-25 ENCOUNTER — Ambulatory Visit (INDEPENDENT_AMBULATORY_CARE_PROVIDER_SITE_OTHER): Payer: Medicare Other

## 2022-09-25 DIAGNOSIS — I10 Essential (primary) hypertension: Secondary | ICD-10-CM | POA: Diagnosis not present

## 2022-10-01 ENCOUNTER — Ambulatory Visit: Payer: Medicare Other

## 2022-10-07 ENCOUNTER — Other Ambulatory Visit: Payer: Self-pay | Admitting: Cardiovascular Disease

## 2022-10-08 ENCOUNTER — Telehealth: Payer: Self-pay | Admitting: *Deleted

## 2022-10-08 NOTE — Telephone Encounter (Signed)
Pt called in and left VM regarding scheduling lung screening CT. Attempted to contact pt but had to leave another message for him to call us back.

## 2022-10-09 ENCOUNTER — Other Ambulatory Visit: Payer: Self-pay | Admitting: Emergency Medicine

## 2022-10-23 ENCOUNTER — Ambulatory Visit
Admission: RE | Admit: 2022-10-23 | Discharge: 2022-10-23 | Disposition: A | Discharge status: Home / Self Care | Payer: Medicare Other | Source: Ambulatory Visit | Attending: Family Medicine | Admitting: Family Medicine

## 2022-10-23 DIAGNOSIS — Z136 Encounter for screening for cardiovascular disorders: Secondary | ICD-10-CM

## 2022-10-23 DIAGNOSIS — Z87891 Personal history of nicotine dependence: Secondary | ICD-10-CM | POA: Diagnosis not present

## 2022-10-25 ENCOUNTER — Ambulatory Visit: Payer: Medicare Other | Attending: Cardiovascular Disease | Admitting: Pharmacist

## 2022-10-25 ENCOUNTER — Telehealth (HOSPITAL_BASED_OUTPATIENT_CLINIC_OR_DEPARTMENT_OTHER): Payer: Self-pay | Admitting: *Deleted

## 2022-10-25 ENCOUNTER — Encounter: Payer: Self-pay | Admitting: Pharmacist

## 2022-10-25 VITALS — BP 107/66 | HR 62

## 2022-10-25 DIAGNOSIS — I10 Essential (primary) hypertension: Secondary | ICD-10-CM | POA: Diagnosis not present

## 2022-10-25 NOTE — Progress Notes (Signed)
Patient ID: Kenneth Weber                 DOB: 1950/12/10                      MRN: [id number]     HPI: Kenneth Weber is a 72 y.o. male referred by Dr. Duke Salvia to HTN clinic. PMH is significant for A Fib, COPD, HTN, and smoking history. This is patient's third PharmD HTN clinic visit.  Patient presents today in good spirits. Reports he has basically stopped checking his BP since it has been well controlled. Thinks the last time he checked it may have been about a month ago.   Biggest complaint is that he reports he feels cold all the time. Currently being evaluated at urology for ED and low testosterone levels.  Current HTN meds:  Amlodipine 5mg  daily Diltiazem 120mg  daily Olmesartan 40mg  daily  BP goal: <130/80   Wt Readings from Last 3 Encounters:  07/24/22 188 lb (85.3 kg)  07/23/22 188 lb 9.6 oz (85.5 kg)  06/19/22 187 lb 3.2 oz (84.9 kg)   BP Readings from Last 3 Encounters:  08/27/22 124/71  07/24/22 134/60  07/23/22 (!) 153/81   Pulse Readings from Last 3 Encounters:  08/27/22 (!) 58  07/24/22 (!) 44  07/23/22 (!) 56    Renal function: CrCl cannot be calculated (Patient's most recent lab result is older than the maximum 21 days allowed.).  Past Medical History:  Diagnosis Date   Allergic rhinitis    Chest pain    COPD (chronic obstructive pulmonary disease) (HCC)    ED (erectile dysfunction)    ED (erectile dysfunction) 06/19/2022   Hyperlipidemia    Hypertension    SOB (shortness of breath)     Current Outpatient Medications on File Prior to Visit  Medication Sig Dispense Refill   acetaminophen (TYLENOL) 500 MG tablet Take 1,000 mg by mouth every 6 (six) hours as needed for headache.     albuterol (VENTOLIN HFA) 108 (90 Base) MCG/ACT inhaler Up to 2 puffs every 4 hours as needed (Patient taking differently: Inhale 1-2 puffs into the lungs every 6 (six) hours as needed for shortness of breath. Up to 2 puffs every 4 hours as needed) 1 each 11   amLODipine  (NORVASC) 5 MG tablet Take 1 tablet (5 mg total) by mouth daily. 90 tablet 3   apixaban (ELIQUIS) 5 MG TABS tablet Take 1 tablet by mouth twice daily 60 tablet 6   ascorbic acid (VITAMIN C) 500 MG tablet Take 500 mg by mouth daily.     Budeson-Glycopyrrol-Formoterol (BREZTRI AEROSPHERE) 160-9-4.8 MCG/ACT AERO Take 2 puffs first thing in am and then another 2 puffs about 12 hours later. (Patient taking differently: Inhale 2 puffs into the lungs 2 (two) times daily. Take 2 puffs first thing in am and then another 2 puffs about 12 hours later.) 10.7 g 11   diltiazem (CARDIZEM CD) 120 MG 24 hr capsule Take 1 capsule by mouth once daily 90 capsule 3   doxylamine, Sleep, (UNISOM) 25 MG tablet Take 25 mg by mouth at bedtime as needed for sleep.     Elderberry 500 MG CAPS Take 500 mg by mouth daily.     latanoprost (XALATAN) 0.005 % ophthalmic solution Place 1 drop into both eyes at bedtime.     olmesartan (BENICAR) 40 MG tablet Take 1 tablet (40 mg total) by mouth daily. 90 tablet 3   Omega-3 Fatty Acids (  FISH OIL) 1000 MG CAPS Take 2 capsules by mouth daily.     timolol (TIMOPTIC) 0.5 % ophthalmic solution Place 1 drop into both eyes every morning.     No current facility-administered medications on file prior to visit.    Allergies  Allergen Reactions   Other    Streptogramins    Codeine Nausea And Vomiting   Lisinopril Cough   Losartan Potassium-Hctz Other (See Comments)    Erectile Dysfunction     Assessment/Plan:  1. Hypertension -  Patient BP in room today 107/66 which is at goal of <130/80. Patient pleased with results. No med changes needed at this time. Next appointment with Dr Duke Salvia.  Recommended patient f/u with urology for further ED and low testosterone workup and PCP for feeling cold. Patient voiced understanding.  Continue amlodipine 5mg  daily Continue olmesartan 40mg  daily Continue diltiazem 120mg  daily F/u with Dr Sanda Linger, PharmD, BCACP, CDCES,  CPP [address] [place], [place], [address] Phone: [phone], Fax: [phone]

## 2022-10-25 NOTE — Telephone Encounter (Signed)
Patient was seen by Pharm D today for ADV HTN f/u  He has appointment to follow up with Dr Duke Salvia Monday however needs to be 1 month out Rescheduled and left message to call back

## 2022-10-25 NOTE — Patient Instructions (Signed)
It was nice meeting you today  We would like your blood pressure to be less than 130/80  Please continue your amlodipine, olmesartan, and diltiazem  I will message Dr Leonides Sake nurse about your last appointment with her  Please let us know if you have any questions  Laural Golden, PharmD, BCACP, CDCES, CPP [address], [address] [place], [place], [address] Phone: [phone], Fax: [phone]

## 2022-10-28 ENCOUNTER — Ambulatory Visit (HOSPITAL_BASED_OUTPATIENT_CLINIC_OR_DEPARTMENT_OTHER): Payer: Medicare Other | Admitting: Cardiovascular Disease

## 2022-10-30 NOTE — Telephone Encounter (Signed)
Patient called in to reschedule on 6/21

## 2022-11-08 ENCOUNTER — Ambulatory Visit
Admission: RE | Admit: 2022-11-08 | Discharge: 2022-11-08 | Disposition: A | Discharge status: Home / Self Care | Payer: Medicare Other | Source: Ambulatory Visit | Attending: Acute Care | Admitting: Acute Care

## 2022-11-08 DIAGNOSIS — Z87891 Personal history of nicotine dependence: Secondary | ICD-10-CM

## 2022-11-08 DIAGNOSIS — R918 Other nonspecific abnormal finding of lung field: Secondary | ICD-10-CM | POA: Diagnosis not present

## 2022-11-08 DIAGNOSIS — Z122 Encounter for screening for malignant neoplasm of respiratory organs: Secondary | ICD-10-CM

## 2022-11-11 ENCOUNTER — Telehealth: Payer: Self-pay | Admitting: Acute Care

## 2022-11-11 DIAGNOSIS — Z87891 Personal history of nicotine dependence: Secondary | ICD-10-CM

## 2022-11-11 DIAGNOSIS — Z122 Encounter for screening for malignant neoplasm of respiratory organs: Secondary | ICD-10-CM

## 2022-11-11 NOTE — Telephone Encounter (Signed)
Called and spoke to patient regarding his LDCT, impression below. Results were given to patient with the recommendation to have a 3 month follow up scan. Patient states he was sick with pneumonia and RSV in December. Patient is agreeable to repeat scan. Order placed. Results and plan sent to PCP. Nothing further needed at this time.     IMPRESSION: 1. New area of apparent post infectious or inflammatory scarring in the left upper lobe, categorized as Lung-RADS 0, incomplete. Short-term follow-up in 3 months is recommended with repeat low-dose chest CT without contrast (please use the following order, "CT CHEST LCS NODULE FOLLOW-UP W/O CM"). 2. The "S" modifier above refers to potentially clinically significant non lung cancer related findings. Specifically, there is aortic atherosclerosis, in addition to left main and three-vessel coronary artery disease. Please note that although the presence of coronary artery calcium documents the presence of coronary artery disease, the severity of this disease and any potential stenosis cannot be assessed on this non-gated CT examination. Assessment for potential risk factor modification, dietary therapy or pharmacologic therapy may be warranted, if clinically indicated. 3. Mild diffuse bronchial wall thickening with mild centrilobular and paraseptal emphysema; imaging findings suggestive of underlying COPD. 4. Cholelithiasis without evidence of acute cholecystitis at this time.

## 2022-11-21 ENCOUNTER — Ambulatory Visit (HOSPITAL_BASED_OUTPATIENT_CLINIC_OR_DEPARTMENT_OTHER): Payer: Medicare Other | Admitting: Cardiovascular Disease

## 2022-12-09 NOTE — Progress Notes (Signed)
Cardiology Office Note:   Date:  12/10/2022  NAME:  Kenneth Weber    MRN: [id number] DOB:  [birth year]   PCP:  [name], MD  Cardiologist:  Reatha Harps, MD  Electrophysiologist:  None   Referring MD: [name], MD   Chief Complaint  Patient presents with   Follow-up         History of Present Illness:   Kenneth Weber is a 72 y.o. male with a hx of persistent Afib, HTN, COPD, who presents for follow-up.  He reports he is doing well.  Blood pressure is well-controlled on amlodipine 5 mg daily, diltiazem 120 mg daily, Benicar 40 mg daily.  Blood pressure is a bit too low.  He reports he feels cold.  He tells me his blood pressure values are fairly low.  Much better than they were.  We did discuss reducing amlodipine to 2.5 mg daily.  He is okay to do this.  We also discussed recent lung cancer screening.  Does show coronary calcium.  No chest pains or trouble breathing.  He does have severe COPD.  He can get a little winded but no symptoms suggestive of angina.  He tells me he feels good.  Kidney function is stable.  Does notice some bruising on Eliquis.  We did discuss watchman.  For now he would like to keep an eye on things.  He is also amendable to taking the cholesterol medication.  Does not need aspirin.  Overall doing quite well.  A-fib burden rather low.  No further A-fib.  Problem List Persistent Afib -Dx 04/28/2022 -> RSV/COPD exacerbation  -CHADSVASC=2 (age, HTN) -<1% burden  2. HTN 3. COPD -severe  4. Esophageal candidiasis 04/30/2022 5. Coronary calcifications  6. HLD -T chol 195, HDL 55, LDL 119, TG 122  Past Medical History: Past Medical History:  Diagnosis Date   Allergic rhinitis    Chest pain    COPD (chronic obstructive pulmonary disease) (HCC)    ED (erectile dysfunction)    ED (erectile dysfunction) 06/19/2022   Hyperlipidemia    Hypertension    SOB (shortness of breath)     Past Surgical History: Past Surgical History:  Procedure  Laterality Date   COLONOSCOPY WITH PROPOFOL N/A 07/24/2022   Procedure: COLONOSCOPY WITH PROPOFOL;  Surgeon: Kerin Salen, MD;  Location: [facility] ENDOSCOPY;  Service: Gastroenterology;  Laterality: N/A;   ESOPHAGOGASTRODUODENOSCOPY N/A 04/30/2022   Procedure: ESOPHAGOGASTRODUODENOSCOPY (EGD);  Surgeon: Vida Rigger, MD;  Location: [facility] ENDOSCOPY;  Service: Gastroenterology;  Laterality: N/A;   POLYPECTOMY  07/24/2022   Procedure: POLYPECTOMY;  Surgeon: Kerin Salen, MD;  Location: [facility] ENDOSCOPY;  Service: Gastroenterology;;    Current Medications: Current Meds  Medication Sig   acetaminophen (TYLENOL) 500 MG tablet Take 1,000 mg by mouth every 6 (six) hours as needed for headache.   albuterol (VENTOLIN HFA) 108 (90 Base) MCG/ACT inhaler Up to 2 puffs every 4 hours as needed (Patient taking differently: Inhale 1-2 puffs into the lungs every 6 (six) hours as needed for shortness of breath. Up to 2 puffs every 4 hours as needed)   amLODipine (NORVASC) 5 MG tablet Take 1 tablet (5 mg total) by mouth daily.   apixaban (ELIQUIS) 5 MG TABS tablet Take 1 tablet by mouth twice daily   ascorbic acid (VITAMIN C) 500 MG tablet Take 500 mg by mouth daily.   Budeson-Glycopyrrol-Formoterol (BREZTRI AEROSPHERE) 160-9-4.8 MCG/ACT AERO Take 2 puffs first thing in am and then another 2 puffs about 12 hours later. (  Patient taking differently: Inhale 2 puffs into the lungs 2 (two) times daily. Take 2 puffs first thing in am and then another 2 puffs about 12 hours later.)   diltiazem (CARDIZEM CD) 120 MG 24 hr capsule Take 1 capsule by mouth once daily   doxylamine, Sleep, (UNISOM) 25 MG tablet Take 25 mg by mouth at bedtime as needed for sleep.   Elderberry 500 MG CAPS Take 500 mg by mouth daily.   latanoprost (XALATAN) 0.005 % ophthalmic solution Place 1 drop into both eyes at bedtime.   olmesartan (BENICAR) 40 MG tablet Take 1 tablet (40 mg total) by mouth daily.   Omega-3 Fatty Acids (FISH OIL) 1000 MG CAPS Take 2 capsules  by mouth daily.   timolol (TIMOPTIC) 0.5 % ophthalmic solution Place 1 drop into both eyes every morning.     Allergies:    Other, Streptogramins, Codeine, Lisinopril, and Losartan potassium-hctz   Social History: Social History   Socioeconomic History   Marital status: Married    Spouse name: Not on file   Number of children: Not on file   Years of education: Not on file   Highest education level: Not on file  Occupational History   Not on file  Tobacco Use   Smoking status: Former    Current packs/day: 0.00    Average packs/day: 1.5 packs/day for 40.0 years (60.0 ttl pk-yrs)    Types: Cigarettes    Start date: 09/13/1973    Quit date: 09/13/2013    Years since quitting: 9.2   Smokeless tobacco: Never   Tobacco comments:    Patient reports cigars at times but trying to quit, 12/01/2020.   Vaping Use   Vaping status: Never Used  Substance and Sexual Activity   Alcohol use: No   Drug use: No   Sexual activity: Not on file  Other Topics Concern   Not on file  Social History Narrative   Not on file   Social Determinants of Health   Financial Resource Strain: Not on file  Food Insecurity: No Food Insecurity (04/28/2022)   Hunger Vital Sign    Worried About Running Out of Food in the Last Year: Never true    Ran Out of Food in the Last Year: Never true  Transportation Needs: No Transportation Needs (04/28/2022)   PRAPARE - Administrator, Civil Service (Medical): No    Lack of Transportation (Non-Medical): No  Physical Activity: Inactive (06/19/2022)   Exercise Vital Sign    Days of Exercise per Week: 0 days    Minutes of Exercise per Session: 0 min  Stress: Not on file  Social Connections: Not on file     Family History: The patient's family history includes CAD in his brother and father; Colon cancer in his father; Hypertension in his father and mother.  ROS:   All other ROS reviewed and negative. Pertinent positives noted in the HPI.      EKGs/Labs/Other Studies Reviewed:   The following studies were personally reviewed by me today:  EKG:  EKG is not ordered today.        TTE 04/26/2022  1. EF challenging in the setting of atrial fibrillation . Left  ventricular ejection fraction, by estimation, is 50 to 55%. The left  ventricle has low normal function. The left ventricle demonstrates global  hypokinesis. Indeterminate diastolic filling  due to E-A fusion.   2. Right ventricular systolic function is normal. The right ventricular  size is normal. Tricuspid  regurgitation signal is inadequate for assessing  PA pressure.   3. Left atrial size was mildly dilated.   4. No evidence of mitral valve regurgitation.   5. The aortic valve was not well visualized. Aortic valve regurgitation  is not visualized.   6. The inferior vena cava is normal in size with greater than 50%  respiratory variability, suggesting right atrial pressure of 3 mmHg.    Recent Labs: 04/22/2022: ALT 17 04/25/2022: TSH 0.158 04/26/2022: B Natriuretic Peptide 279.8; Magnesium 2.2 05/01/2022: BUN 27; Creatinine, Ser 1.23; Hemoglobin 10.6; Platelets 325; Potassium 4.6; Sodium 137   Recent Lipid Panel No results found for: "CHOL", "TRIG", "HDL", "CHOLHDL", "VLDL", "LDLCALC", "LDLDIRECT"  Physical Exam:   VS:  BP 108/64   Pulse (!) 52   Ht 6' (1.829 m)   Wt 192 lb (87.1 kg)   SpO2 100%   BMI 26.04 kg/m    Wt Readings from Last 3 Encounters:  12/10/22 192 lb (87.1 kg)  07/24/22 188 lb (85.3 kg)  07/23/22 188 lb 9.6 oz (85.5 kg)    General: Well nourished, well developed, in no acute distress Head: Atraumatic, normal size  Eyes: PEERLA, EOMI  Neck: Supple, no JVD Endocrine: No thryomegaly Cardiac: Normal S1, S2; RRR; no murmurs, rubs, or gallops Lungs: Clear to auscultation bilaterally, no wheezing, rhonchi or rales  Abd: Soft, nontender, no hepatomegaly  Ext: No edema, pulses 2+ Musculoskeletal: No deformities, BUE and BLE strength  normal and equal Skin: Warm and dry, no rashes   Neuro: Alert and oriented to person, place, time, and situation, CNII-XII grossly intact, no focal deficits  Psych: Normal mood and affect   ASSESSMENT:   Saurabh Carmona is a 72 y.o. male who presents for the following: 1. Primary hypertension   2. Paroxysmal atrial fibrillation (HCC)   3. Coronary artery calcification seen on computed tomography   4. Mixed hyperlipidemia     PLAN:   1. Primary hypertension -BP a bit low.  Reduce amlodipine to 2.5 mg daily.  Continue diltiazem 120 mg daily as well as olmesartan 40 mg daily.  Negative secondary workup for hypertension.  This seems to be essential in nature.  2. Paroxysmal atrial fibrillation (HCC) -Less than 1% burden of A-fib.  Continue diltiazem 120 mg daily.  Continue Eliquis 5 mg twice daily.  3. Coronary artery calcification seen on computed tomography 4. Mixed hyperlipidemia -Coronary calcium seen on lung cancer screening chest CT.  He is okay to start Lipitor 20 mg daily.  LDL cholesterol goal is less than 70.  Most recent value 119.  Suspect he will get to goal on 20 mg of Lipitor.  Disposition: Return in about 1 year (around 12/10/2023).  Medication Adjustments/Labs and Tests Ordered: Current medicines are reviewed at length with the patient today.  Concerns regarding medicines are outlined above.  No orders of the defined types were placed in this encounter.  No orders of the defined types were placed in this encounter.  There are no Patient Instructions on file for this visit.   Time Spent with Patient: I have spent a total of 35 minutes with patient reviewing hospital notes, telemetry, EKGs, labs and examining the patient as well as establishing an assessment and plan that was discussed with the patient.  > 50% of time was spent in direct patient care.  Signed, Lenna Gilford. Flora Lipps, MD, [facility]  [facility]  [address], [address] [place], [place]  [address] [phone]  12/10/2022 9:02 AM

## 2022-12-10 ENCOUNTER — Encounter (HOSPITAL_BASED_OUTPATIENT_CLINIC_OR_DEPARTMENT_OTHER): Payer: Self-pay

## 2022-12-10 ENCOUNTER — Encounter: Payer: Self-pay | Admitting: Cardiovascular Disease

## 2022-12-10 ENCOUNTER — Ambulatory Visit (HOSPITAL_BASED_OUTPATIENT_CLINIC_OR_DEPARTMENT_OTHER): Payer: Medicare Other | Admitting: Family

## 2022-12-10 ENCOUNTER — Ambulatory Visit: Payer: Medicare Other | Admitting: Cardiovascular Disease

## 2022-12-10 VITALS — BP 108/64 | HR 52 | Ht 72.0 in | Wt 192.0 lb

## 2022-12-10 DIAGNOSIS — I480 Paroxysmal atrial fibrillation: Secondary | ICD-10-CM | POA: Diagnosis not present

## 2022-12-10 DIAGNOSIS — I10 Essential (primary) hypertension: Secondary | ICD-10-CM

## 2022-12-10 DIAGNOSIS — E782 Mixed hyperlipidemia: Secondary | ICD-10-CM

## 2022-12-10 DIAGNOSIS — I2510 Atherosclerotic heart disease of native coronary artery without angina pectoris: Secondary | ICD-10-CM

## 2022-12-10 MED ORDER — ATORVASTATIN CALCIUM 20 MG PO TABS: 20.0000 mg | ORAL_TABLET | Freq: Every day | ORAL | 3 refills | Status: DC

## 2022-12-10 MED ORDER — AMLODIPINE BESYLATE 2.5 MG PO TABS: 2.5000 mg | ORAL_TABLET | Freq: Every day | ORAL | 3 refills | Status: DC

## 2022-12-10 NOTE — Patient Instructions (Addendum)
Medication Instructions:    Decrease Amlodipine to 2.5 mg  daily    _ you may take half tablet of 5 mg current Amlodipine until bottle is complete if you like.    Start taking Atorvastatin 20 mg one tablet at bedtime   *If you need a refill on your cardiac medications before your next appointment, please call your pharmacy*   Lab Work: Not needed    Testing/Procedures: Not needed   Follow-Up: At [facility], you and your health needs are our priority.  As part of our continuing mission to provide you with exceptional heart care, we have created designated Provider Care Teams.  These Care Teams include your primary Cardiologist (physician) and Advanced Practice Providers (APPs -  Physician Assistants and Nurse Practitioners) who all work together to provide you with the care you need, when you need it.     Your next appointment:   12 month(s)  The format for your next appointment:   In Person  Provider:   Reatha Harps, MD    Other Instructions   You have been referred to Hypertension clinic at [facility]   01/02/23 at 2:45 pm     bring his blood pressure cuff with him to that visit

## 2023-01-02 ENCOUNTER — Ambulatory Visit (HOSPITAL_BASED_OUTPATIENT_CLINIC_OR_DEPARTMENT_OTHER): Payer: Medicare Other | Admitting: Family

## 2023-01-02 ENCOUNTER — Encounter (HOSPITAL_BASED_OUTPATIENT_CLINIC_OR_DEPARTMENT_OTHER): Payer: Self-pay | Admitting: Family

## 2023-01-02 VITALS — BP 123/62 | HR 68 | Ht 72.0 in | Wt 196.4 lb

## 2023-01-02 DIAGNOSIS — D6859 Other primary thrombophilia: Secondary | ICD-10-CM | POA: Diagnosis not present

## 2023-01-02 DIAGNOSIS — I2510 Atherosclerotic heart disease of native coronary artery without angina pectoris: Secondary | ICD-10-CM

## 2023-01-02 DIAGNOSIS — I10 Essential (primary) hypertension: Secondary | ICD-10-CM

## 2023-01-02 DIAGNOSIS — E785 Hyperlipidemia, unspecified: Secondary | ICD-10-CM | POA: Diagnosis not present

## 2023-01-02 DIAGNOSIS — I480 Paroxysmal atrial fibrillation: Secondary | ICD-10-CM | POA: Diagnosis not present

## 2023-01-02 DIAGNOSIS — Z006 Encounter for examination for normal comparison and control in clinical research program: Secondary | ICD-10-CM

## 2023-01-02 MED ORDER — APIXABAN 5 MG PO TABS: 5.0000 mg | ORAL_TABLET | Freq: Two times a day (BID) | ORAL | 0 refills | Status: DC

## 2023-01-02 NOTE — Patient Instructions (Addendum)
Medication Instructions:   Continue your current medications.   If you want to switch to Xarelto for $89 per month instead of Eliquis, simply call and let us know.   Follow-Up: As needed with the Advanced Hypertension Clinic

## 2023-01-02 NOTE — Progress Notes (Signed)
Advanced Hypertension Clinic Assessment:    Date:  01/02/2023   ID:  Kenneth Weber, DOB 1951-05-05, MRN [id number]  PCP:  [name], MD  Cardiologist:  Reatha Harps, MD  Nephrologist:  Referring MD: [name], MD   CC: Hypertension  History of Present Illness:    Kenneth Weber is a 72 y.o. male with a hx of persistent atrial fibrillation, HTN, COPD here to follow up in the Advanced Hypertension Clinic.   Saw Dr. Flora Lipps 12/10/22 and due to hypotension Amlodipine reduced from 5mg  to 2.5mg  daily. Diltaizem 120mg , Benicar 40mg  daily continued.   Presents today for follow up. He inquires about his Eliquis. States his insurance quit paying for it and is now having to pay $150 per month. Discussed "donut hole" pricing. Walking for exercise often 2 miles at a time. Enjoys spending time with his grandchildren. Reports no shortness of breath nor dyspnea on exertion. Reports no chest pain, pressure, or tightness. No edema, orthopnea, PND. Reports no palpitations.    Creatinine clearance 58 based on labs 09/2022  Previous antihypertensives: Amlodipine 5mg  - hypotension  Past Medical History:  Diagnosis Date   Allergic rhinitis    Chest pain    COPD (chronic obstructive pulmonary disease) (HCC)    ED (erectile dysfunction)    ED (erectile dysfunction) 06/19/2022   Hyperlipidemia    Hypertension    SOB (shortness of breath)     Past Surgical History:  Procedure Laterality Date   COLONOSCOPY WITH PROPOFOL N/A 07/24/2022   Procedure: COLONOSCOPY WITH PROPOFOL;  Surgeon: Kerin Salen, MD;  Location: [facility]COPY;  Service: Gastroenterology;  Laterality: N/A;   ESOPHAGOGASTRODUODENOSCOPY N/A 04/30/2022   Procedure: ESOPHAGOGASTRODUODENOSCOPY (EGD);  Surgeon: Vida Rigger, MD;  Location: [facility] ENDOSCOPY;  Service: Gastroenterology;  Laterality: N/A;   POLYPECTOMY  07/24/2022   Procedure: POLYPECTOMY;  Surgeon: Kerin Salen, MD;  Location: [facility] ENDOSCOPY;  Service: Gastroenterology;;     Current Medications: Current Meds  Medication Sig   acetaminophen (TYLENOL) 500 MG tablet Take 1,000 mg by mouth every 6 (six) hours as needed for headache.   albuterol (VENTOLIN HFA) 108 (90 Base) MCG/ACT inhaler Up to 2 puffs every 4 hours as needed (Patient taking differently: Inhale 1-2 puffs into the lungs every 6 (six) hours as needed for shortness of breath. Up to 2 puffs every 4 hours as needed)   amLODipine (NORVASC) 2.5 MG tablet Take 1 tablet (2.5 mg total) by mouth daily.   apixaban (ELIQUIS) 5 MG TABS tablet Take 1 tablet by mouth twice daily   ascorbic acid (VITAMIN C) 500 MG tablet Take 500 mg by mouth daily.   atorvastatin (LIPITOR) 20 MG tablet Take 1 tablet (20 mg total) by mouth at bedtime.   Budeson-Glycopyrrol-Formoterol (BREZTRI AEROSPHERE) 160-9-4.8 MCG/ACT AERO Take 2 puffs first thing in am and then another 2 puffs about 12 hours later. (Patient taking differently: Inhale 2 puffs into the lungs 2 (two) times daily. Take 2 puffs first thing in am and then another 2 puffs about 12 hours later.)   diltiazem (CARDIZEM CD) 120 MG 24 hr capsule Take 1 capsule by mouth once daily   doxylamine, Sleep, (UNISOM) 25 MG tablet Take 25 mg by mouth at bedtime as needed for sleep.   Elderberry 500 MG CAPS Take 500 mg by mouth daily.   latanoprost (XALATAN) 0.005 % ophthalmic solution Place 1 drop into both eyes at bedtime.   olmesartan (BENICAR) 40 MG tablet Take 1 tablet (40 mg total) by mouth daily.  Omega-3 Fatty Acids (FISH OIL) 1000 MG CAPS Take 2 capsules by mouth daily.   timolol (TIMOPTIC) 0.5 % ophthalmic solution Place 1 drop into both eyes every morning.     Allergies:   Other, Streptogramins, Codeine, Lisinopril, and Losartan potassium-hctz   Social History   Socioeconomic History   Marital status: Married    Spouse name: Not on file   Number of children: Not on file   Years of education: Not on file   Highest education level: Not on file  Occupational  History   Not on file  Tobacco Use   Smoking status: Former    Current packs/day: 0.00    Average packs/day: 1.5 packs/day for 40.0 years (60.0 ttl pk-yrs)    Types: Cigarettes    Start date: 09/13/1973    Quit date: 09/13/2013    Years since quitting: 9.3   Smokeless tobacco: Never   Tobacco comments:    Patient reports cigars at times but trying to quit, 12/01/2020.   Vaping Use   Vaping status: Never Used  Substance and Sexual Activity   Alcohol use: No   Drug use: No   Sexual activity: Not on file  Other Topics Concern   Not on file  Social History Narrative   Not on file   Social Determinants of Health   Financial Resource Strain: Not on file  Food Insecurity: No Food Insecurity (04/28/2022)   Hunger Vital Sign    Worried About Running Out of Food in the Last Year: Never true    Ran Out of Food in the Last Year: Never true  Transportation Needs: No Transportation Needs (04/28/2022)   PRAPARE - Administrator, Civil Service (Medical): No    Lack of Transportation (Non-Medical): No  Physical Activity: Inactive (06/19/2022)   Exercise Vital Sign    Days of Exercise per Week: 0 days    Minutes of Exercise per Session: 0 min  Stress: Not on file  Social Connections: Not on file     Family History: The patient's family history includes CAD in his brother and father; Colon cancer in his father; Hypertension in his father and mother.  ROS:   Please see the history of present illness.     All other systems reviewed and are negative.  EKGs/Labs/Other Studies Reviewed:        Recent Labs: 04/22/2022: ALT 17 04/25/2022: TSH 0.158 04/26/2022: B Natriuretic Peptide 279.8; Magnesium 2.2 05/01/2022: BUN 27; Creatinine, Ser 1.23; Hemoglobin 10.6; Platelets 325; Potassium 4.6; Sodium 137   Recent Lipid Panel No results found for: "CHOL", "TRIG", "HDL", "CHOLHDL", "VLDL", "LDLCALC", "LDLDIRECT"  Physical Exam:   VS:  BP 123/62 (BP Location: Left Arm, Patient  Position: Sitting, Cuff Size: Normal)   Pulse 68   Ht 6' (1.829 m)   Wt 196 lb 6.4 oz (89.1 kg)   SpO2 92%   BMI 26.64 kg/m  , BMI Body mass index is 26.64 kg/m. GENERAL:  Well appearing HEENT: Pupils equal round and reactive, fundi not visualized, oral mucosa unremarkable NECK:  No jugular venous distention, waveform within normal limits, carotid upstroke brisk and symmetric, no bruits, no thyromegaly LYMPHATICS:  No cervical adenopathy LUNGS:  Clear to auscultation bilaterally HEART: IRIR.  PMI not displaced or sustained,S1 and S2 within normal limits, no S3, no S4, no clicks, no rubs, no murmurs ABD:  Flat, positive bowel sounds normal in frequency in pitch, no bruits, no rebound, no guarding, no midline pulsatile mass, no hepatomegaly, no  splenomegaly EXT:  2 plus pulses throughout, no edema, no cyanosis no clubbing SKIN:  No rashes no nodules NEURO:  Cranial nerves II through XII grossly intact, motor grossly intact throughout PSYCH:  Cognitively intact, oriented to person place and time   ASSESSMENT/PLAN:    HTN - BP now at goal. Hypotension resolved with reduced dose of Amlodipine. Secondary workup was unremarkable. Continue present regimen Amlodipine 2.5mg  daily, Diltiazem 120mg  daily, Olmesartan 40mg  daily. He may follow up with ADV HTN clinic on an as-needed basis.   Atrial fib / Hypercoagulable state - presently in donut hole but can afford. Discussed transition to Xarelto to use [place] program for lower price ($89 per month) but he prefers to continue Eliquis at this time.   CAD - Stable with no anginal symptoms. No indication for ischemic evaluation.  GDMT Atorvastatin. No ASA due to [facility]. No BB due to bradycardia.   Screening for Secondary Hypertension:     06/19/2022   10:14 AM  Causes  Drugs/Herbals Screened     - Comments no added salt.  1 coffee.  No EtOh,  no illicits  Sleep Apnea Screened     - Comments snores only on back.  no other symptoms  Thyroid  Disease Screened  Hyperaldosteronism Screened     - Comments check renin and aldosterone  Pheochromocytoma Screened  Cushing's Syndrome N/A  Hyperparathyroidism Screened    Relevant Labs/Studies:    Latest Ref Rng & Units 05/01/2022    2:52 AM 04/29/2022    3:04 AM 04/28/2022   10:23 AM  Basic Labs  Sodium 135 - 145 mmol/L 137  138  137   Potassium 3.5 - 5.1 mmol/L 4.6  4.6  3.9   Creatinine 0.61 - 1.24 mg/dL 5.28  4.13  2.44        Latest Ref Rng & Units 04/25/2022    4:12 AM  Thyroid   TSH 0.350 - 4.500 uIU/mL 0.158        Latest Ref Rng & Units 06/19/2022   11:19 AM  Renin/Aldosterone   Aldosterone 0.0 - 30.0 ng/dL 2.9   Aldos/Renin Ratio 0.0 - 30.0 2.0        Latest Ref Rng & Units 06/19/2022   11:19 AM  Metanephrines/Catecholamines   Epinephrine 0 - 62 pg/mL 39   Norepinephrine 0 - 874 pg/mL 887   Dopamine 0 - 48 pg/mL <30   Metanephrines 0.0 - 88.0 pg/mL <25.0   Normetanephrines  0.0 - 285.2 pg/mL 87.2           09/25/2022    3:42 PM  Renovascular   Renal Artery [place] Completed Yes         Disposition:    follow up with Hypertension Clinic as needed   Medication Adjustments/Labs and Tests Ordered: Current medicines are reviewed at length with the patient today.  Concerns regarding medicines are outlined above.  No orders of the defined types were placed in this encounter.  No orders of the defined types were placed in this encounter.    Signed, Alver Sorrow, NP  01/02/2023 3:00 PM    [facility]

## 2023-01-02 NOTE — Research (Signed)
I saw pt today after Dr. Leonides Sake follow up visit. Pt is in Dr. Leonides Sake Virtual Care HTN Study. Pt filled out research survey. Pt was enrolled in Group 2. Pt has successfully reached him blood pressure goal and completed the Virtual Care HTN Study.

## 2023-01-07 ENCOUNTER — Telehealth: Payer: Self-pay | Admitting: Internal Medicine

## 2023-01-07 NOTE — Telephone Encounter (Signed)
See prev encounter signed encounter accidentally.

## 2023-01-07 NOTE — Telephone Encounter (Signed)
Pt needs a refill for Budeson-Glycopyrrol-Formoterol (BREZTRI AEROSPHERE) 160-9-4.8 MCG/ACT AERO    Pharmacy : [facility] [phone]

## 2023-01-09 NOTE — Telephone Encounter (Signed)
Pt needs a refill for Budeson-Glycopyrrol-Formoterol (BREZTRI AEROSPHERE) 160-9-4.8 MCG/ACT AERO    Pharmacy : [facility] [phone]

## 2023-01-09 NOTE — Telephone Encounter (Signed)
LVM patient needs appointment Per [facility] needed to be seen 1 year. Request can be processed at appointment.

## 2023-01-09 NOTE — Telephone Encounter (Signed)
  Pt scheduled f/u 10/31 with MW He is requesting 1 year script be sent to [facility] [phone]

## 2023-01-09 NOTE — Telephone Encounter (Signed)
That's fine

## 2023-01-10 MED ORDER — BREZTRI AEROSPHERE 160-9-4.8 MCG/ACT IN AERO: INHALATION_SPRAY | RESPIRATORY_TRACT | 11 refills | Status: DC

## 2023-01-10 NOTE — Telephone Encounter (Signed)
Breztri script sent to [facility].

## 2023-01-10 NOTE — Addendum Note (Signed)
Addended by: Judd Gaudier on: 01/10/2023 02:44 PM   Modules accepted: Orders

## 2023-02-03 DIAGNOSIS — U071 COVID-19: Secondary | ICD-10-CM | POA: Diagnosis not present

## 2023-02-03 DIAGNOSIS — R062 Wheezing: Secondary | ICD-10-CM | POA: Diagnosis not present

## 2023-02-03 DIAGNOSIS — R9389 Abnormal findings on diagnostic imaging of other specified body structures: Secondary | ICD-10-CM | POA: Diagnosis not present

## 2023-02-03 DIAGNOSIS — R051 Acute cough: Secondary | ICD-10-CM | POA: Diagnosis not present

## 2023-02-03 DIAGNOSIS — J40 Bronchitis, not specified as acute or chronic: Secondary | ICD-10-CM | POA: Diagnosis not present

## 2023-02-03 DIAGNOSIS — R918 Other nonspecific abnormal finding of lung field: Secondary | ICD-10-CM | POA: Diagnosis not present

## 2023-02-10 ENCOUNTER — Other Ambulatory Visit: Payer: Medicare Other

## 2023-02-17 DIAGNOSIS — Z8709 Personal history of other diseases of the respiratory system: Secondary | ICD-10-CM | POA: Diagnosis not present

## 2023-02-17 DIAGNOSIS — J40 Bronchitis, not specified as acute or chronic: Secondary | ICD-10-CM | POA: Diagnosis not present

## 2023-03-05 NOTE — Progress Notes (Unsigned)
Kenneth Weber, male    DOB: Aug 19, 1950   MRN: [id number]   Brief patient profile:  65 yow male quit smoking 09/2013/MM  with doe  Only sob uphill s need for inhalers vax x 2 then omicron Jan 2022 cough/sob and sick x 10 days other added x albuterol ? Helped but not back to baseline ex tol more due to fatigue than sob referred to pulmonary clinic in [place]  10/10/2020 by Dr   [name]   History of Present Illness  10/10/2020  Pulmonary/ 1st office eval/[name]  Chief Complaint  Patient presents with   Pulmonary Consult    Referred by Dr [name]. Pt states had covid 19 in Jan 2022-having SOB since then. He gets SOB when lifting things.   Dyspnea:  MMRC1 = can walk nl pace, flat grade, can't hurry or go uphills or steps s sob   Cough: none  Sleep: wheeze at night sometimes  SABA use: twice a week esp noct   Rec I would prefer you not use Timoptic eyedrops and instead Betoptic  Plan A = Automatic = Always=   Breztri Take 2 puffs first thing in am and then another 2 puffs about 12 hours later - ok to reduce to one twice daily  Work on inhaler technique: Plan B = Backup (to supplement plan A, not to replace it) Only use your albuterol inhaler as rescue Please schedule a follow up office visit in 6 weeks, call sooner if needed with pfts on return     12/10/2021  :    alpha one AT phenotype  MM level 135    03/06/2023  f/u ov/Ludwin Flahive re: GOLD 3 maint on Breztri one bid  > no change symptoms vs 2 bid  Chief Complaint  Patient presents with   Follow-up    Bronchitis, COVID and pneumonia 3 weeks ago.  Rx steriods and abt.  Dyspnea:  MMRC1 = can walk nl pace, flat grade, can't hurry or go uphills or steps s sob  / walking with wife at 1.5 mile x 15-20 min  Cough: nne  Sleeping: flat bd 2 pillows under head  resp cc  SABA use: rarely  02: none   Lung cancer screening :  in program    No obvious day to day or daytime variability or assoc excess/ purulent sputum or mucus plugs or hemoptysis  or cp or chest tightness, subjective wheeze or overt sinus or hb symptoms.    Also denies any obvious fluctuation of symptoms with weather or environmental changes or other aggravating or alleviating factors except as outlined above   No unusual exposure hx or h/o childhood pna/ asthma or knowledge of premature birth.  Current Allergies, Complete Past Medical History, Past Surgical History, Family History, and Social History were reviewed in [employer] record.  ROS  The following are not active complaints unless bolded Hoarseness, sore throat, dysphagia, dental problems, itching, sneezing,  nasal congestion or discharge of excess mucus or purulent secretions, ear ache,   fever, chills, sweats, unintended wt loss or wt gain, classically pleuritic or exertional cp,  orthopnea pnd or arm/hand swelling  or leg swelling, presyncope, palpitations, abdominal pain, anorexia, nausea, vomiting, diarrhea  or change in bowel habits or change in bladder habits, change in stools or change in urine, dysuria, hematuria,  rash, arthralgias, visual complaints, headache, numbness, weakness or ataxia or problems with walking or coordination,  change in mood or  memory.  Current Meds  Medication Sig   acetaminophen (TYLENOL) 500 MG tablet Take 1,000 mg by mouth every 6 (six) hours as needed for headache.   albuterol (VENTOLIN HFA) 108 (90 Base) MCG/ACT inhaler Up to 2 puffs every 4 hours as needed (Patient taking differently: Inhale 1-2 puffs into the lungs every 6 (six) hours as needed for shortness of breath. Up to 2 puffs every 4 hours as needed)   amLODipine (NORVASC) 2.5 MG tablet Take 1 tablet (2.5 mg total) by mouth daily.   amoxicillin-clavulanate (AUGMENTIN) 875-125 MG tablet Take 1 tablet by mouth 2 (two) times daily.   apixaban (ELIQUIS) 5 MG TABS tablet Take 1 tablet by mouth twice daily   apixaban (ELIQUIS) 5 MG TABS tablet Take 1 tablet (5 mg total) by mouth 2 (two) times  daily.   ascorbic acid (VITAMIN C) 500 MG tablet Take 500 mg by mouth daily.   atorvastatin (LIPITOR) 20 MG tablet Take 1 tablet (20 mg total) by mouth at bedtime.   Budeson-Glycopyrrol-Formoterol (BREZTRI AEROSPHERE) 160-9-4.8 MCG/ACT AERO Take 2 puffs first thing in am and then another 2 puffs about 12 hours later.   diltiazem (CARDIZEM CD) 120 MG 24 hr capsule Take 1 capsule by mouth once daily   doxycycline (VIBRA-TABS) 100 MG tablet Take 100 mg by mouth 2 (two) times daily.   doxylamine, Sleep, (UNISOM) 25 MG tablet Take 25 mg by mouth at bedtime as needed for sleep.   Elderberry 500 MG CAPS Take 500 mg by mouth daily.   latanoprost (XALATAN) 0.005 % ophthalmic solution Place 1 drop into both eyes at bedtime.   olmesartan (BENICAR) 40 MG tablet Take 1 tablet (40 mg total) by mouth daily.   Omega-3 Fatty Acids (FISH OIL) 1000 MG CAPS Take 2 capsules by mouth daily.   predniSONE (DELTASONE) 50 MG tablet 1 tablet with food Orally Once a day for 5 day(s)   timolol (TIMOPTIC) 0.5 % ophthalmic solution Place 1 drop into both eyes every morning.           Past Medical History:  Diagnosis Date   Allergic rhinitis    Chest pain    ED (erectile dysfunction)    Hyperlipidemia    Hypertension    SOB (shortness of breath)      Objective:    Wts  03/06/2023    193   12/10/2021         188   12/01/20 183 lb (83 kg)  10/10/20 181 lb (82.1 kg)  12/08/13 206 lb (93.4 kg)    Vital signs reviewed  03/06/2023  - Note at rest 02 sats  97% on RA   General appearance:    amb pleasant wm nad  HEENT : Oropharynx  clear      NECK :  without  apparent JVD/ palpable Nodes/TM    LUNGS: no acc muscle use,  Mild barrel  contour chest wall with bilateral  Distant bs s audible wheeze and  without cough on insp or exp maneuvers  and mild  Hyperresonant  to  percussion bilaterally     CV:  RRR  no s3 or murmur or increase in P2, and no edema   ABD:  soft and nontender    MS:  Nl gait/ ext warm  without deformities Or obvious joint restrictions  calf tenderness, cyanosis or clubbing     SKIN: warm and dry without lesions    NEURO:  alert, approp, nl sensorium with  no motor or cerebellar  deficits apparent.                  Assessment   N

## 2023-03-06 ENCOUNTER — Ambulatory Visit: Payer: Medicare Other | Admitting: Internal Medicine

## 2023-03-06 ENCOUNTER — Encounter: Payer: Self-pay | Admitting: Internal Medicine

## 2023-03-06 VITALS — BP 130/64 | HR 72 | Temp 97.8°F | Ht 72.0 in | Wt 193.4 lb

## 2023-03-06 DIAGNOSIS — Z87891 Personal history of nicotine dependence: Secondary | ICD-10-CM | POA: Diagnosis not present

## 2023-03-06 DIAGNOSIS — J449 Chronic obstructive pulmonary disease, unspecified: Secondary | ICD-10-CM

## 2023-03-06 NOTE — Patient Instructions (Signed)
No change in medications   If having more daytime breathing problems then ok to take both breztri puffs in am   Please schedule a follow up visit in +12 months but call sooner if needed

## 2023-03-06 NOTE — Assessment & Plan Note (Addendum)
Quit smoking 2015/MM - flared p covid 19 jan 22  - 10/10/2020  After extensive coaching inhaler device,  effectiveness =    90% > trial of breztri 2bid and prn saba - PFT's  12/01/2020  FEV1 1.68 (47 % ) ratio 0.58  p 28 % improvement from saba p 0 prior to study with DLCO  19.33 (69%) corrects to 3.52 (86%)  for alv volume and FV curve classically concave   - LDSCT  12/202/22  Emphysema  -  12/10/2021  :    alpha one AT phenotype  MM level 135    Group D (now reclassified as E) in terms of symptom/risk and laba/lama/ICS  therefore appropriate rx at this point >>>  breztri up to 2 bid / approp saba   F/u yearly appropriate at this point

## 2023-03-10 ENCOUNTER — Inpatient Hospital Stay
Admission: RE | Admit: 2023-03-10 | Discharge: 2023-03-10 | Disposition: A | Discharge status: Home / Self Care | Payer: Medicare Other | Source: Ambulatory Visit | Attending: Acute Care | Admitting: Acute Care

## 2023-03-10 DIAGNOSIS — I700 Atherosclerosis of aorta: Secondary | ICD-10-CM | POA: Diagnosis not present

## 2023-03-10 DIAGNOSIS — Z87891 Personal history of nicotine dependence: Secondary | ICD-10-CM

## 2023-03-10 DIAGNOSIS — J439 Emphysema, unspecified: Secondary | ICD-10-CM | POA: Diagnosis not present

## 2023-03-10 DIAGNOSIS — R918 Other nonspecific abnormal finding of lung field: Secondary | ICD-10-CM | POA: Diagnosis not present

## 2023-03-10 DIAGNOSIS — Z122 Encounter for screening for malignant neoplasm of respiratory organs: Secondary | ICD-10-CM

## 2023-03-27 DIAGNOSIS — D500 Iron deficiency anemia secondary to blood loss (chronic): Secondary | ICD-10-CM | POA: Diagnosis not present

## 2023-03-27 DIAGNOSIS — R7303 Prediabetes: Secondary | ICD-10-CM | POA: Diagnosis not present

## 2023-03-27 DIAGNOSIS — D6869 Other thrombophilia: Secondary | ICD-10-CM | POA: Diagnosis not present

## 2023-03-27 DIAGNOSIS — R490 Dysphonia: Secondary | ICD-10-CM | POA: Diagnosis not present

## 2023-03-27 DIAGNOSIS — I700 Atherosclerosis of aorta: Secondary | ICD-10-CM | POA: Diagnosis not present

## 2023-03-27 DIAGNOSIS — I4891 Unspecified atrial fibrillation: Secondary | ICD-10-CM | POA: Diagnosis not present

## 2023-03-27 DIAGNOSIS — E7800 Pure hypercholesterolemia, unspecified: Secondary | ICD-10-CM | POA: Diagnosis not present

## 2023-03-27 DIAGNOSIS — J439 Emphysema, unspecified: Secondary | ICD-10-CM | POA: Diagnosis not present

## 2023-03-27 DIAGNOSIS — I10 Essential (primary) hypertension: Secondary | ICD-10-CM | POA: Diagnosis not present

## 2023-03-28 DIAGNOSIS — H401121 Primary open-angle glaucoma, left eye, mild stage: Secondary | ICD-10-CM | POA: Diagnosis not present

## 2023-04-04 DIAGNOSIS — R051 Acute cough: Secondary | ICD-10-CM | POA: Diagnosis not present

## 2023-04-04 DIAGNOSIS — J018 Other acute sinusitis: Secondary | ICD-10-CM | POA: Diagnosis not present

## 2023-04-08 ENCOUNTER — Other Ambulatory Visit: Payer: Self-pay

## 2023-04-08 DIAGNOSIS — Z122 Encounter for screening for malignant neoplasm of respiratory organs: Secondary | ICD-10-CM

## 2023-04-08 DIAGNOSIS — Z87891 Personal history of nicotine dependence: Secondary | ICD-10-CM

## 2023-04-18 DIAGNOSIS — H401121 Primary open-angle glaucoma, left eye, mild stage: Secondary | ICD-10-CM | POA: Diagnosis not present

## 2023-04-29 ENCOUNTER — Other Ambulatory Visit: Payer: Self-pay | Admitting: Cardiovascular Disease

## 2023-04-29 DIAGNOSIS — I480 Paroxysmal atrial fibrillation: Secondary | ICD-10-CM

## 2023-04-29 NOTE — Telephone Encounter (Signed)
Eliquis 5mg  refill request received. Patient is 72 years old, weight-87.7kg, Crea-1.63 on 03/27/23 via [facility] from [place], [place], and last seen by Dr. Flora Lipps on 12/10/22. Dose is appropriate based on dosing criteria. Will send in refill to requested pharmacy.

## 2023-05-01 DIAGNOSIS — M67431 Ganglion, right wrist: Secondary | ICD-10-CM | POA: Diagnosis not present

## 2023-05-08 ENCOUNTER — Telehealth: Payer: Self-pay | Admitting: Cardiovascular Disease

## 2023-05-08 NOTE — Telephone Encounter (Signed)
 Pt c/o medication issue:  1. Name of Medication: apixaban  (ELIQUIS ) 5 MG TABS tablet   2. How are you currently taking this medication (dosage and times per day)?   Take 1 tablet (5 mg total) by mouth 2 (two) times daily.    3. Are you having a reaction (difficulty breathing--STAT)? No  4. What is your medication issue? Pt states that medication cost has gone up to $300, which he is not able to afford. Pt would like to know if there is an alternate med that is able to be prescribed.  Patient calling the office for samples of medication:   1.  What medication and dosage are you requesting samples for? apixaban  (ELIQUIS ) 5 MG TABS tablet   2.  Are you currently out of this medication? Yes

## 2023-05-08 NOTE — Telephone Encounter (Signed)
 Spoke to patient. Verified name and DOB. Patient is out of Eliquis and he went to get a refill and it would cost him $300.He did not have money to pay. We are out of samples at [facility]. Sending message to [facility] to see if samples available there.

## 2023-05-09 NOTE — Telephone Encounter (Signed)
 Called and spoke to patient's wife. I relayed below message to her as well and told her patient care assistance paperwork was mailed out yesterday.    Maccia, Melissa D, RPH-CPP1 hour ago (6:49 AM)   Please advice patient that medicare now has a payment plan option he can opt into. Therefore, he can get his medications from the pharmacy without paying and then make smaller monthly payments to medicare to pay for them. He can go to [employer] or call his part D provider for more information

## 2023-05-09 NOTE — Telephone Encounter (Signed)
 Called and spoke to patient. Verified name and DOB. Below message relayed. Patient verbalized understanding and agree.    Maccia, Melissa D, RPH-CPP  Please advice patient that medicare now has a payment plan option he can opt into. Therefore, he can get his medications from the pharmacy without paying and then make smaller monthly payments to medicare to pay for them. He can go to [employer] group.[employer] or call his part D provider for more information

## 2023-05-09 NOTE — Telephone Encounter (Signed)
 Please advice patient that medicare now has a payment plan option he can opt into. Therefore, he can get his medications from the pharmacy without paying and then make smaller monthly payments to medicare to pay for them. He can go to [employer] or call his part D provider for more information.

## 2023-05-09 NOTE — Telephone Encounter (Signed)
 Patient's spouse is calling to check on the status of samples. I advised her neither of our [place] offices have samples available for eliquis  at this time.   She is requesting a callback to let them know if the [facility] forms have already been sent out, if not she would like to come by the office to pick them up.  I also advised there was another option through his insurance, but it was not discussed fully.   She is requesting her husband be called back on his number in regards to this instead of her.   Please advise.

## 2023-05-13 ENCOUNTER — Telehealth: Payer: Self-pay | Admitting: Cardiovascular Disease

## 2023-05-13 NOTE — Telephone Encounter (Signed)
 Patient calling the office for samples of medication:   1.  What medication and dosage are you requesting samples for? apixaban (ELIQUIS) 5 MG TABS tablet  2.  Are you currently out of this medication? No

## 2023-05-13 NOTE — Telephone Encounter (Signed)
 Called and spoke to patient's wife. Verified name and DOB. She is requesting samples of Eliquis  5 mg. Advised her that we do not have samples at this time. I asked if patient was out of medication and she stated no they just picked up a 30 day supply for $360 from the pharmacy. She stated they just wanted samples so they would not run out of medication.

## 2023-05-15 ENCOUNTER — Telehealth: Payer: Self-pay | Admitting: Cardiovascular Disease

## 2023-05-15 NOTE — Telephone Encounter (Signed)
 Wife called back to say that she forgot to date the forms, ask that we dont the forms before sending to [employer]. Please advise

## 2023-05-22 NOTE — Telephone Encounter (Signed)
Patient identification verified by 2 forms. Shade Flood, RN     Called and spoke to patient's wife cynthia. Informed her patient assistance forms for eliquis have been completed by Dr. Flora Lipps. I wil fax documents off today and then place completed forms and fax confirmation at front desk for patient to pick up tomorrow 1/17.   Aram Beecham agrees with plan, no questions at this time

## 2023-05-26 NOTE — Telephone Encounter (Signed)
Patient identification verified by 2 forms. Shade Flood, RN     Called and spoke to patient's wife Aram Beecham). Informed her patient assistance forms for Eliquis have been completed by Dr. Flora Lipps, faxed back to [employer] and I will leave original forms and fax confirmation at front office for them. Aram Beecham verbalized understanding. Stated she will pick up forms on Tuesday 05/27/23. No further questions at this time.

## 2023-07-08 DIAGNOSIS — Z03818 Encounter for observation for suspected exposure to other biological agents ruled out: Secondary | ICD-10-CM | POA: Diagnosis not present

## 2023-07-08 DIAGNOSIS — R051 Acute cough: Secondary | ICD-10-CM | POA: Diagnosis not present

## 2023-07-08 DIAGNOSIS — J069 Acute upper respiratory infection, unspecified: Secondary | ICD-10-CM | POA: Diagnosis not present

## 2023-07-14 DIAGNOSIS — R059 Cough, unspecified: Secondary | ICD-10-CM | POA: Diagnosis not present

## 2023-08-03 IMAGING — CT CT CHEST LUNG CANCER SCREENING LOW DOSE W/O CM
1 series · 10 of 10 positions shown, 13 images · non-contrast
Comparison: None.

CLINICAL DATA: Former smoker, 30 pack-year history.

EXAM:
CT CHEST WITHOUT CONTRAST LOW-DOSE FOR LUNG CANCER SCREENING
TECHNIQUE: Multidetector CT imaging of the chest was performed following the
standard protocol without IV contrast.

[ct lung segmentation data · axial · 0.73mm/px · z∈[+859,+859]mm · 10 of 371 frames shown]
[frame 1/371  mediastinal]
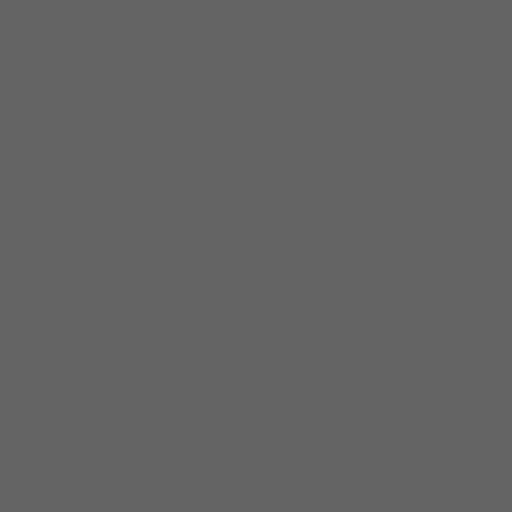
[frame 1/371  lung]
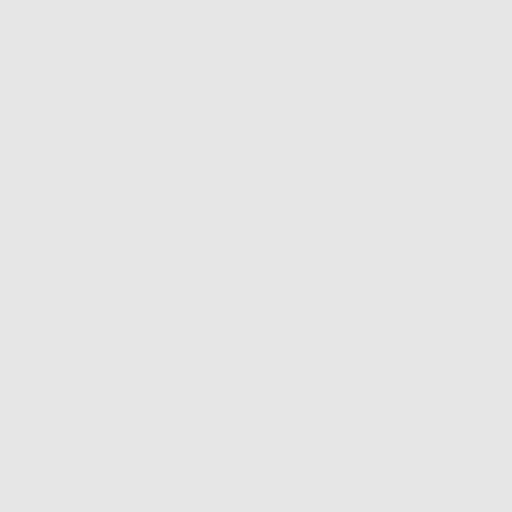
[frame 42/371  lung]
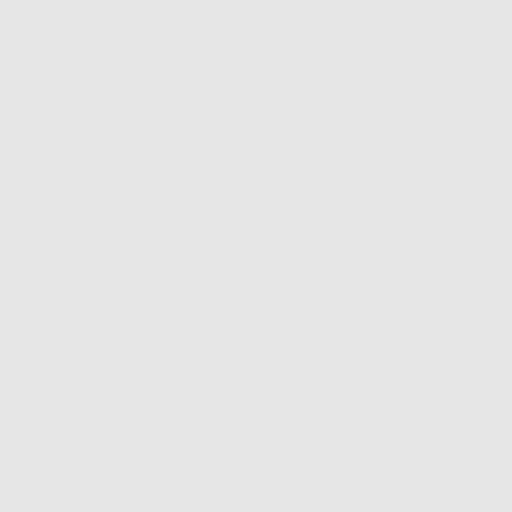
[frame 83/371  lung]
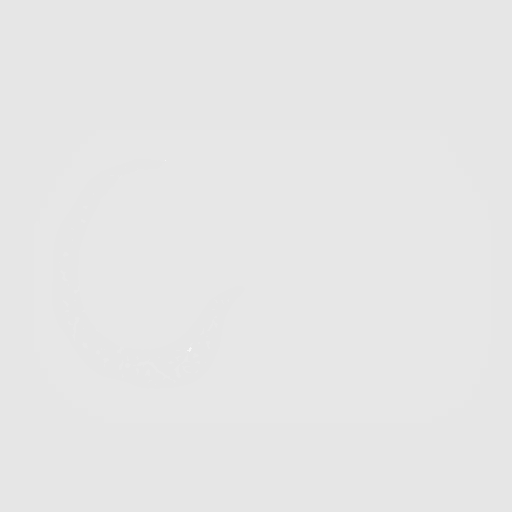
[frame 124/371  lung]
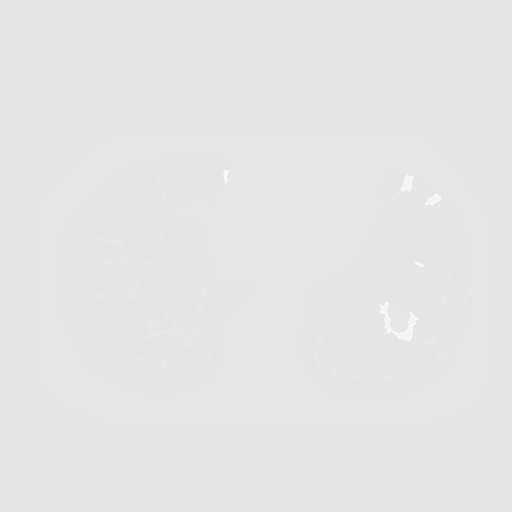
[frame 165/371  mediastinal]
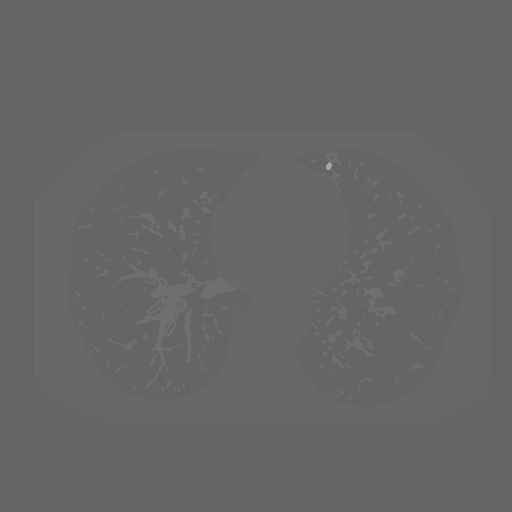
[frame 165/371  lung]
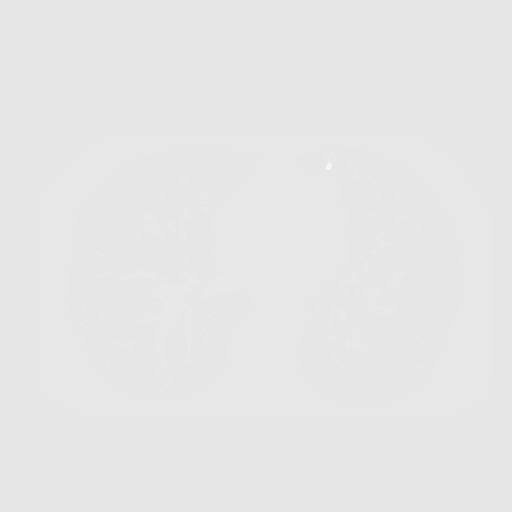
[frame 206/371  lung]
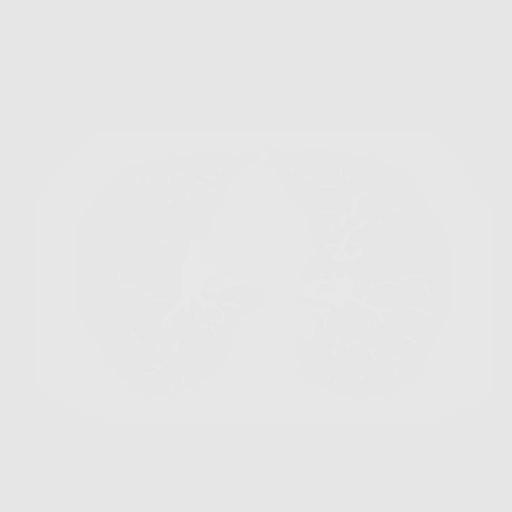
[frame 247/371  lung]
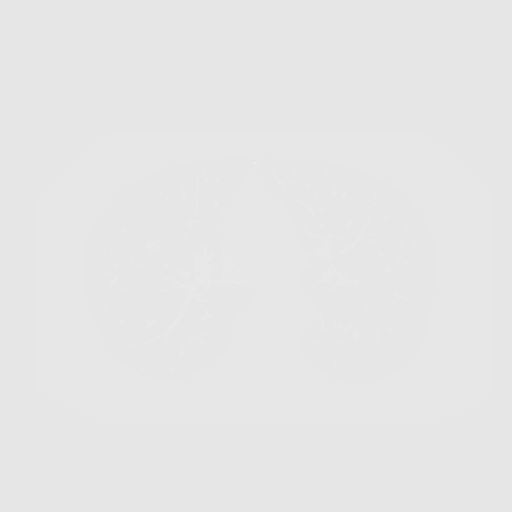
[frame 288/371  lung]
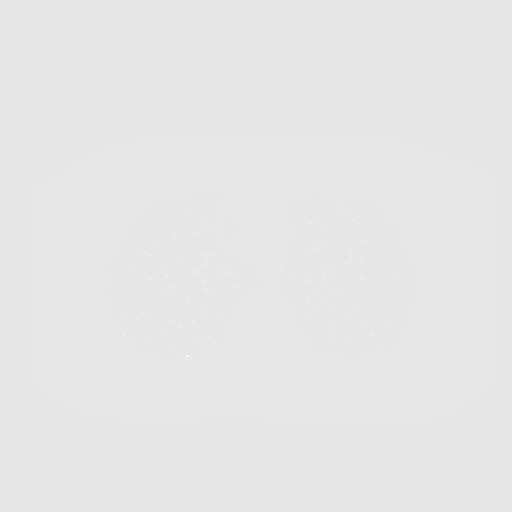
[frame 329/371  mediastinal]
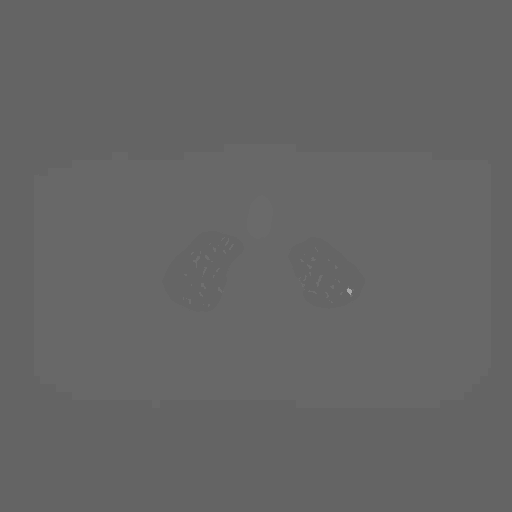
[frame 329/371  lung]
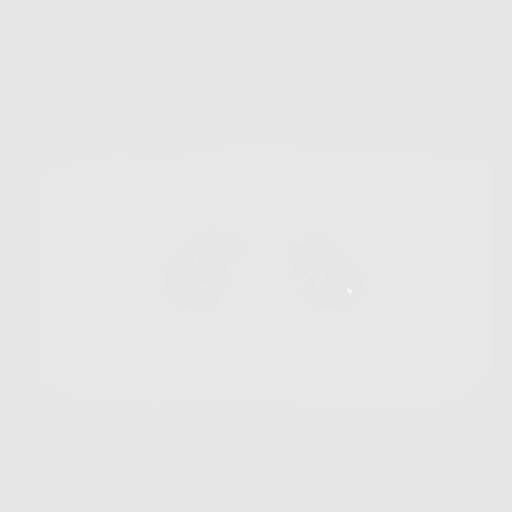
[frame 371/371  lung]
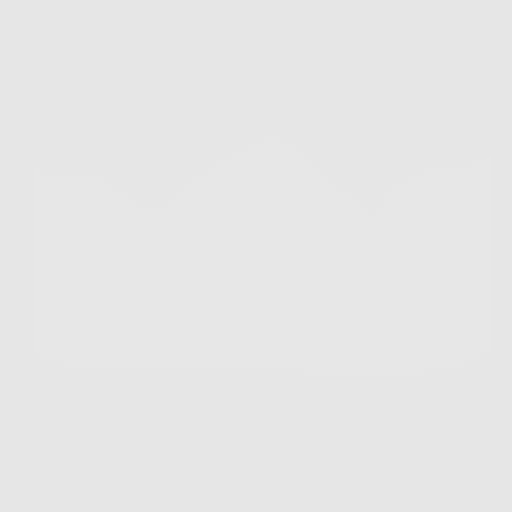

[10 of 10 positions shown; findings below may reference images not displayed]

FINDINGS: Cardiovascular: Atherosclerotic calcification of the aorta and
coronary arteries. Heart size normal. No pericardial effusion.

Mediastinum/Nodes: No pathologically enlarged mediastinal or
axillary lymph nodes. Hilar regions are difficult to definitively
evaluate without IV contrast but appear grossly unremarkable.
Esophagus is grossly unremarkable.

Lungs/Pleura: Centrilobular and paraseptal emphysema. Bibasilar
scarring. Peribronchovascular nodularity in the peripheral left
lower lobe, likely postinfectious in etiology. Pulmonary nodules
measure 5.0 mm or less in size. No pleural fluid. Minimal debris in
the airway.

Upper Abdomen: Visualized portion of the liver is unremarkable.
Stones in the gallbladder. Thickening of both adrenal glands.
Visualized portion of the right kidney is unremarkable. Probable
cm cyst in the left kidney. Visualized portions of the spleen,
pancreas, stomach and bowel are grossly unremarkable.

Musculoskeletal: Degenerative changes in the spine. Flowing anterior
osteophytosis in the thoracic spine. No worrisome lytic or sclerotic
lesions.
IMPRESSION: 1. Lung-RADS 2, benign appearance or behavior. Continue annual
screening with low-dose chest CT without contrast in 12 months.
2. Cholelithiasis.
3. Aortic atherosclerosis ([id number].J). Coronary artery
calcification.
4.  Emphysema ([id number]).

## 2023-08-27 ENCOUNTER — Other Ambulatory Visit (HOSPITAL_BASED_OUTPATIENT_CLINIC_OR_DEPARTMENT_OTHER): Payer: Self-pay | Admitting: Cardiovascular Disease

## 2023-09-23 ENCOUNTER — Other Ambulatory Visit: Payer: Self-pay | Admitting: Cardiovascular Disease

## 2023-10-06 DIAGNOSIS — J069 Acute upper respiratory infection, unspecified: Secondary | ICD-10-CM | POA: Diagnosis not present

## 2023-10-20 ENCOUNTER — Other Ambulatory Visit: Payer: Self-pay | Admitting: Cardiovascular Disease

## 2023-10-20 DIAGNOSIS — I4891 Unspecified atrial fibrillation: Secondary | ICD-10-CM | POA: Diagnosis not present

## 2023-10-20 DIAGNOSIS — R7303 Prediabetes: Secondary | ICD-10-CM | POA: Diagnosis not present

## 2023-10-20 DIAGNOSIS — I10 Essential (primary) hypertension: Secondary | ICD-10-CM | POA: Diagnosis not present

## 2023-10-20 DIAGNOSIS — J439 Emphysema, unspecified: Secondary | ICD-10-CM | POA: Diagnosis not present

## 2023-10-20 DIAGNOSIS — Z23 Encounter for immunization: Secondary | ICD-10-CM | POA: Diagnosis not present

## 2023-10-20 DIAGNOSIS — E7800 Pure hypercholesterolemia, unspecified: Secondary | ICD-10-CM | POA: Diagnosis not present

## 2023-10-20 DIAGNOSIS — Z122 Encounter for screening for malignant neoplasm of respiratory organs: Secondary | ICD-10-CM | POA: Diagnosis not present

## 2023-10-20 DIAGNOSIS — D500 Iron deficiency anemia secondary to blood loss (chronic): Secondary | ICD-10-CM | POA: Diagnosis not present

## 2023-10-20 DIAGNOSIS — Z0000 Encounter for general adult medical examination without abnormal findings: Secondary | ICD-10-CM | POA: Diagnosis not present

## 2023-10-20 DIAGNOSIS — I480 Paroxysmal atrial fibrillation: Secondary | ICD-10-CM

## 2023-10-20 NOTE — Telephone Encounter (Signed)
 Prescription refill request for Eliquis  received. Indication:afib Last office visit:8/24 Scr:1.63  11/24 Age: 73 Weight:87.7  kg  Prescription refilled

## 2023-10-22 DIAGNOSIS — E875 Hyperkalemia: Secondary | ICD-10-CM | POA: Diagnosis not present

## 2023-11-18 ENCOUNTER — Other Ambulatory Visit (HOSPITAL_BASED_OUTPATIENT_CLINIC_OR_DEPARTMENT_OTHER): Payer: Self-pay | Admitting: Cardiovascular Disease

## 2023-11-18 ENCOUNTER — Other Ambulatory Visit: Payer: Self-pay | Admitting: Cardiovascular Disease

## 2023-11-20 DIAGNOSIS — D649 Anemia, unspecified: Secondary | ICD-10-CM | POA: Diagnosis not present

## 2023-11-21 ENCOUNTER — Telehealth: Payer: Self-pay | Admitting: Cardiovascular Disease

## 2023-11-21 NOTE — Telephone Encounter (Signed)
 Spoke to pt, he is following with PCP regarding low blood counts as follows:  RBC 2.83 Hgb 9.2 HcT 26.7  These numbers are lower than they were about 1 month ago. I was able to pull up his lab results and PCP note from [facility] ([facility]). You may have to click the update button in [facility] to see these. It is under [facility].  PCP referred him to GI, he is waiting on call back to get this scheduled. PCP also had pt stop Eliquis  and this is pt's concern. Please advise.

## 2023-11-21 NOTE — Telephone Encounter (Signed)
 Pt c/o medication issue:  1. Name of Medication:   apixaban  (ELIQUIS ) 5 MG TABS tablet   2. How are you currently taking this medication (dosage and times per day)?    As prescribed  3. Are you having a reaction (difficulty breathing--STAT)?   4. What is your medication issue?   Patient stated his recent lab work showed he had low blood count (2.83) and was told he should stop his Eliquis  until he sees his GI doctor.  Patient wants call back to discuss this.

## 2023-11-21 NOTE — Telephone Encounter (Signed)
 Per labs with PCP 03/27/23 Hb 11.2 ? 10/21/23 Hb 10.6 ? 11/21/23 Hb 9.2. Agree with recommendation to hold Eliquis  and get worked in promptly with GI.   He is due for visit 12/2024 with Dr. Barbaraann, please assist to schedule cardiology follow up.  CCing Dr. Barbaraann as RICK.  Osiel Stick S Ayra Hodgdon, NP

## 2023-11-24 NOTE — Telephone Encounter (Signed)
 Please call pt to schedule f/u with Dr. Barbaraann per provider request.

## 2023-11-24 NOTE — Telephone Encounter (Addendum)
 Spoke to pt, relayed feedback as stated by both Dr. Barbaraann and Lennard Finder, NP. Pt will stop the Eliquis  today as recommended by GI and agreed upon by cardiology providers. Msg sent to scheduling pool to call pt for cardiology f/u appt. Pt appreciates call, nothing further needed at this time.  See copied provider note below: I also agree. He should hold eliquis  until seen by GI.    Darryle T. Barbaraann, MD, FACC

## 2023-11-27 DIAGNOSIS — D649 Anemia, unspecified: Secondary | ICD-10-CM | POA: Diagnosis not present

## 2023-11-27 DIAGNOSIS — R710 Precipitous drop in hematocrit: Secondary | ICD-10-CM | POA: Diagnosis not present

## 2023-11-27 DIAGNOSIS — Q2733 Arteriovenous malformation of digestive system vessel: Secondary | ICD-10-CM | POA: Diagnosis not present

## 2023-12-19 ENCOUNTER — Other Ambulatory Visit: Payer: Self-pay | Admitting: Cardiovascular Disease

## 2023-12-19 ENCOUNTER — Other Ambulatory Visit: Payer: Self-pay | Admitting: Internal Medicine

## 2023-12-19 DIAGNOSIS — J449 Chronic obstructive pulmonary disease, unspecified: Secondary | ICD-10-CM

## 2023-12-19 MED ORDER — ALBUTEROL SULFATE HFA 108 (90 BASE) MCG/ACT IN AERS: INHALATION_SPRAY | RESPIRATORY_TRACT | 11 refills | Status: DC

## 2023-12-19 NOTE — Telephone Encounter (Signed)
 Copied from CRM [id number]. Topic: Clinical - Medication Refill >> Dec 19, 2023  9:09 AM Leila BROCKS wrote: Most Recent Pulmonary Care Visit:  Provider: Dr. Ozell America Department: [facility] Date: 03/06/23  Medication(s): albuterol  (VENTOLIN  HFA) 108 (90 Base) MCG/ACT inhaler   Has the patient contacted their pharmacy? Yes. Patient's spouse [name] [phone] states the medication refill was sent to pcp, and they advised it has to be refilled from Dr. America not them. (Agent: If no, request that the patient contact the pharmacy for the refill. If patient does not wish to contact the pharmacy document the reason why and proceed with request.) (Agent: If yes, when and what did the pharmacy advise?)  This is the patient's preferred pharmacy:  [id number] - [place] ([place]), [place] - [address] [place] ([place])  [address] Phone: [phone] Fax: [phone]  Is this the correct pharmacy for this prescription? Yes If no, delete pharmacy and type the correct one.   Has the prescription been filled recently? No  Is the patient out of the medication? Yes  Has the patient been seen for an appointment in the last year OR does the patient have an upcoming appointment? Yes  Can we respond through MyChart? Yes and called patient's spouse [name] [phone]  Agent: Please be advised that Rx refills may take up to 3 business days. We ask that you follow-up with your pharmacy.

## 2024-01-02 ENCOUNTER — Other Ambulatory Visit: Payer: Self-pay | Admitting: Gastroenterology

## 2024-01-15 ENCOUNTER — Telehealth: Payer: Self-pay | Admitting: Internal Medicine

## 2024-01-15 NOTE — Telephone Encounter (Signed)
 Copied from CRM #[id number]. Topic: General - Other >> Jan 15, 2024 11:32 AM Rilla NOVAK wrote: Reason for CRM: Patient's wife call. Patient in the process of re-enrolling in the program for his [employer].  Wife wants to know if you have copy of the old application because she needs to know what he listed as income, prior to bringing the application in. Nothing has changed. Are you able to use the same application from last year so he can be re-certified? Please call wife @ [phone].

## 2024-01-16 NOTE — Telephone Encounter (Signed)
 Pt spouse notified on VM will need a new application yearly as required. I do not see income on a previous application

## 2024-01-20 NOTE — Progress Notes (Signed)
 Anesthesia Review:  PCP: Alm Rav  Cardiologist : Reche Finder, LOV 01/02/23  Pulm- DR Darlean- LOV 03/06/23   PPM/ ICD: Device Orders: Rep Notified:  Chest x-ray : CT Chest- 03/10/23  EKG :  Monitor- 2024  Echo : 2023  Stress test:  2016  Cardiac Cath :   Activity level:  Sleep Study/ CPAP : Fasting Blood Sugar :      / Checks Blood Sugar -- times a day:    Blood Thinner/ Instructions /Last Dose: ASA / Instructions/ Last Dose :   Eliquis - last dose on    01/20/2024- LVMM and sksed for call back,

## 2024-01-21 ENCOUNTER — Encounter (HOSPITAL_COMMUNITY): Payer: Self-pay | Admitting: Gastroenterology

## 2024-01-21 ENCOUNTER — Other Ambulatory Visit: Payer: Self-pay

## 2024-01-27 ENCOUNTER — Encounter (HOSPITAL_COMMUNITY)
Admission: RE | Disposition: A | Discharge status: Home / Self Care | Payer: Self-pay | Source: Home / Self Care | Attending: Gastroenterology

## 2024-01-27 ENCOUNTER — Ambulatory Visit (HOSPITAL_COMMUNITY): Admitting: Anesthesiology

## 2024-01-27 ENCOUNTER — Other Ambulatory Visit: Payer: Self-pay

## 2024-01-27 ENCOUNTER — Encounter (HOSPITAL_COMMUNITY): Payer: Self-pay | Admitting: Gastroenterology

## 2024-01-27 ENCOUNTER — Ambulatory Visit (HOSPITAL_COMMUNITY)
Admission: RE | Admit: 2024-01-27 | Discharge: 2024-01-27 | Disposition: A | Discharge status: Home / Self Care | Attending: Gastroenterology | Admitting: Gastroenterology

## 2024-01-27 ENCOUNTER — Ambulatory Visit (HOSPITAL_BASED_OUTPATIENT_CLINIC_OR_DEPARTMENT_OTHER): Admitting: Anesthesiology

## 2024-01-27 DIAGNOSIS — J449 Chronic obstructive pulmonary disease, unspecified: Secondary | ICD-10-CM

## 2024-01-27 DIAGNOSIS — K5730 Diverticulosis of large intestine without perforation or abscess without bleeding: Secondary | ICD-10-CM

## 2024-01-27 DIAGNOSIS — D500 Iron deficiency anemia secondary to blood loss (chronic): Secondary | ICD-10-CM | POA: Diagnosis not present

## 2024-01-27 DIAGNOSIS — K644 Residual hemorrhoidal skin tags: Secondary | ICD-10-CM | POA: Diagnosis not present

## 2024-01-27 DIAGNOSIS — Z87891 Personal history of nicotine dependence: Secondary | ICD-10-CM | POA: Diagnosis not present

## 2024-01-27 DIAGNOSIS — Z860100 Personal history of colon polyps, unspecified: Secondary | ICD-10-CM | POA: Diagnosis not present

## 2024-01-27 DIAGNOSIS — Z860101 Personal history of adenomatous and serrated colon polyps: Secondary | ICD-10-CM | POA: Diagnosis not present

## 2024-01-27 DIAGNOSIS — Z1211 Encounter for screening for malignant neoplasm of colon: Secondary | ICD-10-CM | POA: Diagnosis not present

## 2024-01-27 DIAGNOSIS — M1990 Unspecified osteoarthritis, unspecified site: Secondary | ICD-10-CM | POA: Diagnosis not present

## 2024-01-27 DIAGNOSIS — K648 Other hemorrhoids: Secondary | ICD-10-CM | POA: Insufficient documentation

## 2024-01-27 DIAGNOSIS — I10 Essential (primary) hypertension: Secondary | ICD-10-CM | POA: Diagnosis not present

## 2024-01-27 DIAGNOSIS — K921 Melena: Secondary | ICD-10-CM | POA: Diagnosis not present

## 2024-01-27 DIAGNOSIS — Z09 Encounter for follow-up examination after completed treatment for conditions other than malignant neoplasm: Secondary | ICD-10-CM | POA: Diagnosis not present

## 2024-01-27 HISTORY — DX: Unspecified osteoarthritis, unspecified site: M19.90

## 2024-01-27 HISTORY — DX: Anemia, unspecified: D64.9

## 2024-01-27 HISTORY — PX: COLONOSCOPY: SHX5424

## 2024-01-27 HISTORY — DX: Pneumonia, unspecified organism: J18.9

## 2024-01-27 LAB — CBC
HCT: 29.7 % — ABNORMAL LOW (ref 39.0–52.0)
Hemoglobin: 9.6 g/dL — ABNORMAL LOW (ref 13.0–17.0)
MCH: 31.5 pg (ref 26.0–34.0)
MCHC: 32.3 g/dL (ref 30.0–36.0)
MCV: 97.4 fL (ref 80.0–100.0)
Platelets: 216 K/uL (ref 150–400)
RBC: 3.05 MIL/uL — ABNORMAL LOW (ref 4.22–5.81)
RDW: 11.8 % (ref 11.5–15.5)
WBC: 5.7 K/uL (ref 4.0–10.5)
nRBC: 0 % (ref 0.0–0.2)

## 2024-01-27 SURGERY — COLONOSCOPY
Anesthesia: Monitor Anesthesia Care

## 2024-01-27 MED ORDER — PROPOFOL 500 MG/50ML IV EMUL
INTRAVENOUS | Status: DC | PRN
  Administered 2024-01-27: 130 ug/kg/min via INTRAVENOUS

## 2024-01-27 MED ORDER — PROPOFOL 10 MG/ML IV BOLUS
INTRAVENOUS | Status: DC | PRN
  Administered 2024-01-27: 20 mg via INTRAVENOUS
  Administered 2024-01-27: 10 mg via INTRAVENOUS
  Administered 2024-01-27: 20 mg via INTRAVENOUS

## 2024-01-27 MED ORDER — SODIUM CHLORIDE 0.9 % IV SOLN: INTRAVENOUS | Status: DC

## 2024-01-27 MED ORDER — SODIUM CHLORIDE 0.9 % IV SOLN
INTRAVENOUS | Status: AC | PRN
Start: 2024-01-27 — End: 2024-01-27
  Administered 2024-01-27: 500 mL via INTRAMUSCULAR

## 2024-01-27 MED ORDER — EPHEDRINE SULFATE-NACL 50-0.9 MG/10ML-% IV SOSY
PREFILLED_SYRINGE | INTRAVENOUS | Status: DC | PRN
  Administered 2024-01-27: 10 mg via INTRAVENOUS

## 2024-01-27 NOTE — Transfer of Care (Signed)
 Immediate Anesthesia Transfer of Care Note  Patient: Kenneth Weber  Procedure(s) Performed: COLONOSCOPY  Patient Location: PACU  Anesthesia Type:MAC  Level of Consciousness: sedated  Airway & Oxygen Therapy: Patient Spontanous Breathing and Patient connected to face mask oxygen  Post-op Assessment: Report given to RN and Post -op Vital signs reviewed and stable  Post vital signs: Reviewed and stable  Last Vitals:  Vitals Value Taken Time  BP    Temp    Pulse 58 01/27/24 08:51  Resp    SpO2 99 % 01/27/24 08:51  Vitals shown include unfiled device data.  Last Pain:  Vitals:   01/27/24 0711  TempSrc: Temporal  PainSc: 0-No pain         Complications: No notable events documented.

## 2024-01-27 NOTE — Discharge Instructions (Addendum)
 YOU HAD AN ENDOSCOPIC PROCEDURE TODAY: Refer to the procedure report and other information in the discharge instructions given to you for any specific questions about what was found during the examination. If this information does not answer your questions, please call [facility] office at [phone] to clarify.   YOU SHOULD EXPECT: Some feelings of bloating in the abdomen. Passage of more gas than usual. Walking can help get rid of the air that was put into your GI tract during the procedure and reduce the bloating. If you had a lower endoscopy (such as a colonoscopy or flexible sigmoidoscopy) you may notice spotting of blood in your stool or on the toilet paper. Some abdominal soreness may be present for a day or two, also.  DIET: Your first meal following the procedure should be a light meal and then it is ok to progress to your normal diet. A half-sandwich or bowl of soup is an example of a good first meal. Heavy or fried foods are harder to digest and may make you feel nauseous or bloated. Drink plenty of fluids but you should avoid alcoholic beverages for 24 hours. If you had a esophageal dilation, please see attached instructions for diet.    ACTIVITY: Your care partner should take you home directly after the procedure. You should plan to take it easy, moving slowly for the rest of the day. You can resume normal activity the day after the procedure however YOU SHOULD NOT DRIVE, use power tools, machinery or perform tasks that involve climbing or major physical exertion for 24 hours (because of the sedation medicines used during the test).   SYMPTOMS TO REPORT IMMEDIATELY: A gastroenterologist can be reached at any hour. Please call [phone]  for any of the following symptoms:  Following lower endoscopy (colonoscopy, flexible sigmoidoscopy) Excessive amounts of blood in the stool  Significant tenderness, worsening of abdominal pains  Swelling of the abdomen that is new, acute  Fever of 100  or higher   FOLLOW UP: Please call for blood count results on Thursday if you do not hear from us  tomorrow and call sooner if needed from a GI standpoint If any biopsies were taken you will be contacted by phone or by letter within the next 1-3 weeks. Call [phone]  if you have not heard about the biopsies in 3 weeks.  Please also call with any specific questions about appointments or follow up tests. YOU HAD AN ENDOSCOPIC PROCEDURE TODAY: Refer to the procedure report and other information in the discharge instructions given to you for any specific questions about what was found during the examination. If this information does not answer your questions, please call [facility] office at [phone] to clarify.

## 2024-01-27 NOTE — Progress Notes (Signed)
 Kenneth Weber 8:15 AM  Subjective: Patient without any complaints no signs of bleeding and we discussed the procedure  Objective: Vital signs stable afebrile no acute distress exam please see preassessment evaluation  Assessment: Anemia history of colon polyps history of abnormal capsule for possible cecal bleeding  Plan: Okay to proceed with colonoscopy with anesthesia assistance  [facility]  office [phone] After 5PM or if no answer call [phone]

## 2024-01-27 NOTE — Anesthesia Postprocedure Evaluation (Signed)
 Anesthesia Post Note  Patient: Kenneth Weber  Procedure(s) Performed: COLONOSCOPY     Patient location during evaluation: Endoscopy Anesthesia Type: MAC Level of consciousness: oriented, awake and alert and awake Pain management: pain level controlled Vital Signs Assessment: post-procedure vital signs reviewed and stable Respiratory status: spontaneous breathing, nonlabored ventilation, respiratory function stable and patient connected to nasal cannula oxygen Cardiovascular status: blood pressure returned to baseline and stable Postop Assessment: no headache, no backache and no apparent nausea or vomiting Anesthetic complications: no   No notable events documented.  Last Vitals:  Vitals:   01/27/24 0855 01/27/24 0907  BP: (!) 96/45 115/60  Pulse: 69 (!) 59  Resp: 18 19  Temp:    SpO2: 100% 98%    Last Pain:  Vitals:   01/27/24 0907  TempSrc:   PainSc: 0-No pain                 Garnette FORBES Skillern

## 2024-01-27 NOTE — Op Note (Signed)
 [facility] Patient Name: Kenneth Weber Procedure Date: 01/27/2024 MRN: [id number] Attending MD: Oliva Boots , MD, [id number] Date of Birth: [birth year] CSN: [id number] Age: 73 Admit Type: Outpatient Procedure:                Colonoscopy Indications:              Last colonoscopy: March 2024, Iron deficiency                            anemia secondary to chronic blood loss, Follow-up                            for history of adenomatous polyps in the colon and                            history of abnormal capsule endoscopy with some                            blood in the cecum Providers:                Oliva Boots, MD, Jacquelyn Jaci Pierce, RN,                            Coye Bade, Technician Referring MD:              Medicines:                Monitored Anesthesia Care Complications:            No immediate complications. Estimated Blood Loss:     Estimated blood loss: none. Procedure:                Pre-Anesthesia Assessment:                           - Prior to the procedure, a History and Physical                            was performed, and patient medications and                            allergies were reviewed. The patient's tolerance of                            previous anesthesia was also reviewed. The risks                            and benefits of the procedure and the sedation                            options and risks were discussed with the patient.                            All questions were answered, and informed consent  was obtained. Prior Anticoagulants: The patient has                            taken no anticoagulant or antiplatelet agents. ASA                            Grade Assessment: III - A patient with severe                            systemic disease. After reviewing the risks and                            benefits, the patient was deemed in satisfactory                            condition  to undergo the procedure.                           After obtaining informed consent, the colonoscope                            was passed under direct vision. Throughout the                            procedure, the patient's blood pressure, pulse, and                            oxygen saturations were monitored continuously. The                            [id number]-[id number] ([id number]) Olympus Colonoscope was                            introduced through the anus and advanced to the the                            terminal ileum. The colonoscopy was performed                            without difficulty. The patient tolerated the                            procedure well. The quality of the bowel                            preparation was adequate. The terminal ileum,                            ileocecal valve, appendiceal orifice, and rectum                            were photographed. Scope In: 8:27:35 AM Scope Out: 8:46:33 AM Scope Withdrawal Time: 0 hours 12 minutes 36 seconds  Total Procedure Duration: 0 hours 18 minutes 58 seconds  Findings:      External and internal hemorrhoids were found during retroflexion, during       perianal exam and during digital exam. The hemorrhoids were small.      Scattered small-mouthed diverticula were found in the sigmoid colon.      The terminal ileum appeared normal.      The colon (entire examined portion) appeared normal. No blood was seen       throughout the exam      The exam was otherwise without abnormality on direct and retroflexion       views. Impression:               - External and internal hemorrhoids.                           - Diverticulosis in the sigmoid colon.                           - The examined portion of the ileum was normal.                           - The entire examined colon is normal.                           - The examination was otherwise normal on direct                            and retroflexion views.                            - No specimens collected. Moderate Sedation:      Not Applicable - Patient had care per Anesthesia. Recommendation:           - Patient has a contact number available for                            emergencies. The signs and symptoms of potential                            delayed complications were discussed with the                            patient. Return to normal activities tomorrow.                            Written discharge instructions were provided to the                            patient.                           - Soft diet today.                           - Continue present medications.                           -  Repeat colonoscopy in 3 years for screening                            purposes.                           - Return to GI office PRN.                           - Telephone GI clinic if symptomatic PRN.                           - Telephone GI clinic for lab results in 2 days. To                            decide further workup and plan Procedure Code(s):        --- Professional ---                           [phone], Colonoscopy, flexible; diagnostic, including                            collection of specimen(s) by brushing or washing,                            when performed (separate procedure) Diagnosis Code(s):        --- Professional ---                           D50.0, Iron deficiency anemia secondary to blood                            loss (chronic)                           Z86.010, Personal history of colonic polyps                           K57.30, Diverticulosis of large intestine without                            perforation or abscess without bleeding CPT copyright 2022 American [employer] Association. All rights reserved. The codes documented in this report are preliminary and upon coder review may  be revised to meet current compliance requirements. Oliva Boots, MD 01/27/2024 9:01:18 AM This report has been signed  electronically. Number of Addenda: 0

## 2024-01-27 NOTE — Anesthesia Preprocedure Evaluation (Addendum)
 Anesthesia Evaluation  Patient identified by MRN, date of birth, ID band Patient awake    Reviewed: Allergy & Precautions, NPO status , Patient's Chart, lab work & pertinent test results  Airway Mallampati: II  TM Distance: >3 FB Neck ROM: Full    Dental  (+) Teeth Intact, Dental Advisory Given   Pulmonary COPD,  COPD inhaler, former smoker   Pulmonary exam normal breath sounds clear to auscultation       Cardiovascular hypertension, Pt. on medications Normal cardiovascular exam Rhythm:Regular Rate:Normal     Neuro/Psych negative neurological ROS  negative psych ROS   GI/Hepatic Neg liver ROS,,,hx of colon polyps   Endo/Other  negative endocrine ROS    Renal/GU negative Renal ROS     Musculoskeletal  (+) Arthritis ,    Abdominal   Peds  Hematology  (+) Blood dyscrasia (Eliquis )   Anesthesia Other Findings Day of surgery medications reviewed with the patient.  Reproductive/Obstetrics                              Anesthesia Physical Anesthesia Plan  ASA: 3  Anesthesia Plan: MAC   Post-op Pain Management:    Induction: Intravenous  PONV Risk Score and Plan: 1 and TIVA and Treatment may vary due to age or medical condition  Airway Management Planned: Natural Airway and Simple Face Mask  Additional Equipment:   Intra-op Plan:   Post-operative Plan:   Informed Consent: I have reviewed the patients History and Physical, chart, labs and discussed the procedure including the risks, benefits and alternatives for the proposed anesthesia with the patient or authorized representative who has indicated his/her understanding and acceptance.     Dental advisory given  Plan Discussed with: CRNA and Anesthesiologist  Anesthesia Plan Comments:          Anesthesia Quick Evaluation

## 2024-01-28 ENCOUNTER — Encounter (HOSPITAL_COMMUNITY): Payer: Self-pay | Admitting: Gastroenterology

## 2024-02-10 ENCOUNTER — Other Ambulatory Visit: Payer: Self-pay | Admitting: Cardiovascular Disease

## 2024-02-11 ENCOUNTER — Observation Stay (HOSPITAL_BASED_OUTPATIENT_CLINIC_OR_DEPARTMENT_OTHER)
Admission: EM | Admit: 2024-02-11 | Discharge: 2024-02-14 | Disposition: A | Discharge status: Home / Self Care | Source: Other Acute Inpatient Hospital | Attending: Internal Medicine | Admitting: Internal Medicine

## 2024-02-11 ENCOUNTER — Emergency Department (HOSPITAL_BASED_OUTPATIENT_CLINIC_OR_DEPARTMENT_OTHER)

## 2024-02-11 ENCOUNTER — Encounter (HOSPITAL_BASED_OUTPATIENT_CLINIC_OR_DEPARTMENT_OTHER): Payer: Self-pay | Admitting: Emergency Medicine

## 2024-02-11 ENCOUNTER — Other Ambulatory Visit: Payer: Self-pay

## 2024-02-11 DIAGNOSIS — I959 Hypotension, unspecified: Secondary | ICD-10-CM | POA: Diagnosis not present

## 2024-02-11 DIAGNOSIS — N179 Acute kidney failure, unspecified: Principal | ICD-10-CM | POA: Diagnosis present

## 2024-02-11 DIAGNOSIS — R911 Solitary pulmonary nodule: Secondary | ICD-10-CM | POA: Diagnosis not present

## 2024-02-11 DIAGNOSIS — Z79899 Other long term (current) drug therapy: Secondary | ICD-10-CM | POA: Insufficient documentation

## 2024-02-11 DIAGNOSIS — J432 Centrilobular emphysema: Secondary | ICD-10-CM | POA: Diagnosis not present

## 2024-02-11 DIAGNOSIS — R739 Hyperglycemia, unspecified: Secondary | ICD-10-CM | POA: Insufficient documentation

## 2024-02-11 DIAGNOSIS — J069 Acute upper respiratory infection, unspecified: Secondary | ICD-10-CM

## 2024-02-11 DIAGNOSIS — Z87891 Personal history of nicotine dependence: Secondary | ICD-10-CM | POA: Diagnosis not present

## 2024-02-11 DIAGNOSIS — K5730 Diverticulosis of large intestine without perforation or abscess without bleeding: Secondary | ICD-10-CM | POA: Diagnosis not present

## 2024-02-11 DIAGNOSIS — J441 Chronic obstructive pulmonary disease with (acute) exacerbation: Secondary | ICD-10-CM | POA: Diagnosis not present

## 2024-02-11 DIAGNOSIS — K8020 Calculus of gallbladder without cholecystitis without obstruction: Secondary | ICD-10-CM | POA: Diagnosis not present

## 2024-02-11 DIAGNOSIS — Z1152 Encounter for screening for COVID-19: Secondary | ICD-10-CM | POA: Diagnosis not present

## 2024-02-11 DIAGNOSIS — R051 Acute cough: Secondary | ICD-10-CM | POA: Diagnosis not present

## 2024-02-11 DIAGNOSIS — N390 Urinary tract infection, site not specified: Secondary | ICD-10-CM | POA: Diagnosis not present

## 2024-02-11 DIAGNOSIS — Z7901 Long term (current) use of anticoagulants: Secondary | ICD-10-CM | POA: Diagnosis not present

## 2024-02-11 DIAGNOSIS — I4891 Unspecified atrial fibrillation: Secondary | ICD-10-CM | POA: Insufficient documentation

## 2024-02-11 DIAGNOSIS — Z743 Need for continuous supervision: Secondary | ICD-10-CM | POA: Diagnosis not present

## 2024-02-11 DIAGNOSIS — R531 Weakness: Secondary | ICD-10-CM | POA: Diagnosis not present

## 2024-02-11 DIAGNOSIS — I10 Essential (primary) hypertension: Secondary | ICD-10-CM | POA: Diagnosis present

## 2024-02-11 DIAGNOSIS — R7989 Other specified abnormal findings of blood chemistry: Secondary | ICD-10-CM | POA: Diagnosis present

## 2024-02-11 DIAGNOSIS — E785 Hyperlipidemia, unspecified: Secondary | ICD-10-CM | POA: Diagnosis not present

## 2024-02-11 DIAGNOSIS — E875 Hyperkalemia: Secondary | ICD-10-CM | POA: Insufficient documentation

## 2024-02-11 DIAGNOSIS — R062 Wheezing: Secondary | ICD-10-CM | POA: Diagnosis not present

## 2024-02-11 LAB — URINALYSIS, W/ REFLEX TO CULTURE (INFECTION SUSPECTED)
Bacteria, UA: NONE SEEN
Bilirubin Urine: NEGATIVE
Glucose, UA: NEGATIVE mg/dL
Hgb urine dipstick: NEGATIVE
Ketones, ur: NEGATIVE mg/dL
Leukocytes,Ua: NEGATIVE
Nitrite: NEGATIVE
Protein, ur: NEGATIVE mg/dL
Specific Gravity, Urine: 1.013 (ref 1.005–1.030)
pH: 5 (ref 5.0–8.0)

## 2024-02-11 LAB — COMPREHENSIVE METABOLIC PANEL WITH GFR
ALT: 11 U/L (ref 0–44)
AST: 23 U/L (ref 15–41)
Albumin: 4.5 g/dL (ref 3.5–5.0)
Alkaline Phosphatase: 91 U/L (ref 38–126)
Anion gap: 15 (ref 5–15)
BUN: 45 mg/dL — ABNORMAL HIGH (ref 8–23)
CO2: 18 mmol/L — ABNORMAL LOW (ref 22–32)
Calcium: 9.6 mg/dL (ref 8.9–10.3)
Chloride: 106 mmol/L (ref 98–111)
Creatinine, Ser: 2.59 mg/dL — ABNORMAL HIGH (ref 0.61–1.24)
GFR, Estimated: 26 mL/min — ABNORMAL LOW (ref >60)
Glucose, Bld: 106 mg/dL — ABNORMAL HIGH (ref 70–99)
Potassium: 5.4 mmol/L — ABNORMAL HIGH (ref 3.5–5.1)
Sodium: 139 mmol/L (ref 135–145)
Total Bilirubin: 0.2 mg/dL (ref 0.0–1.2)
Total Protein: 7.2 g/dL (ref 6.5–8.1)

## 2024-02-11 LAB — CBC WITH DIFFERENTIAL/PLATELET
Abs Immature Granulocytes: 0.01 K/uL (ref 0.00–0.07)
Basophils Absolute: 0 K/uL (ref 0.0–0.1)
Basophils Relative: 1 %
Eosinophils Absolute: 0.1 K/uL (ref 0.0–0.5)
Eosinophils Relative: 2 %
HCT: 30.4 % — ABNORMAL LOW (ref 39.0–52.0)
Hemoglobin: 10 g/dL — ABNORMAL LOW (ref 13.0–17.0)
Immature Granulocytes: 0 %
Lymphocytes Relative: 22 %
Lymphs Abs: 1.8 K/uL (ref 0.7–4.0)
MCH: 31.9 pg (ref 26.0–34.0)
MCHC: 32.9 g/dL (ref 30.0–36.0)
MCV: 97.1 fL (ref 80.0–100.0)
Monocytes Absolute: 1 K/uL (ref 0.1–1.0)
Monocytes Relative: 13 %
Neutro Abs: 5.1 K/uL (ref 1.7–7.7)
Neutrophils Relative %: 62 %
Platelets: 269 K/uL (ref 150–400)
RBC: 3.13 MIL/uL — ABNORMAL LOW (ref 4.22–5.81)
RDW: 12.3 % (ref 11.5–15.5)
WBC: 8.2 K/uL (ref 4.0–10.5)
nRBC: 0 % (ref 0.0–0.2)

## 2024-02-11 LAB — RESP PANEL BY RT-PCR (RSV, FLU A&B, COVID)  RVPGX2
Influenza A by PCR: NEGATIVE
Influenza B by PCR: NEGATIVE
Resp Syncytial Virus by PCR: NEGATIVE
SARS Coronavirus 2 by RT PCR: NEGATIVE

## 2024-02-11 LAB — LACTIC ACID, PLASMA: Lactic Acid, Venous: 0.5 mmol/L (ref 0.5–1.9)

## 2024-02-11 MED ORDER — SODIUM CHLORIDE 0.9 % IV SOLN: Freq: Once | INTRAVENOUS | Status: AC

## 2024-02-11 MED ORDER — SODIUM ZIRCONIUM CYCLOSILICATE 5 G PO PACK
5.0000 g | PACK | Freq: Once | ORAL | Status: AC
  Administered 2024-02-11: 5 g via ORAL
  Filled 2024-02-11: qty 1

## 2024-02-11 MED ORDER — SODIUM CHLORIDE 0.9 % IV BOLUS
1000.0000 mL | Freq: Once | INTRAVENOUS | Status: AC
  Administered 2024-02-11: 1000 mL via INTRAVENOUS

## 2024-02-11 NOTE — ED Provider Notes (Signed)
 [place] EMERGENCY DEPARTMENT AT [facility] Provider Note   CSN: [id number] Arrival date & time: 02/11/24  1738     Patient presents with: Hypotension and Cough   Kenneth Weber is a 73 y.o. male.   This is a 73 year old male who is here today for cough, being told his blood pressure was low at his PCPs, and being told that his kidney function had worsened.  He has a history of COPD not on home O2.  He states that he has been dealing with an upper respiratory infection for the last 4 days.  Have been taking over-the-counter medication without any improvement.  Was put on antibiotic and steroids today by his PCP.  History of A-fib on Eliquis  and diltiazem .  Patient reports he has been having to wake up to go to the bathroom 3 times most evenings.   Cough      Prior to Admission medications   Medication Sig Start Date End Date Taking? Authorizing Provider  acetaminophen  (TYLENOL ) 500 MG tablet Take 1,000 mg by mouth every 6 (six) hours as needed for headache.    [provider]  albuterol  (VENTOLIN  HFA) 108 (90 Base) MCG/ACT inhaler Up to 2 puffs every 4 hours as needed 12/19/23   Wert, Michael B, MD  amLODipine  (NORVASC ) 2.5 MG tablet Take 1 tablet by mouth once daily 11/20/23   O'Neal, Darryle Ned, MD  amoxicillin -clavulanate (AUGMENTIN ) 875-125 MG tablet Take 1 tablet by mouth 2 (two) times daily. 02/17/23   [provider]  apixaban  (ELIQUIS ) 5 MG TABS tablet Take 1 tablet (5 mg total) by mouth 2 (two) times daily. 01/02/23   Vannie Reche RAMAN, NP  apixaban  (ELIQUIS ) 5 MG TABS tablet Take 1 tablet by mouth twice daily 10/20/23   O'Neal, Darryle Ned, MD  ascorbic acid (VITAMIN C) 500 MG tablet Take 500 mg by mouth daily.    [provider]  atorvastatin  (LIPITOR) 20 MG tablet TAKE 1 TABLET BY MOUTH AT BEDTIME 11/20/23   O'Neal, Darryle Ned, MD  Budeson-Glycopyrrol-Formoterol (BREZTRI  AEROSPHERE) 160-9-4.8 MCG/ACT AERO Take 2 puffs first thing  in am and then another 2 puffs about 12 hours later. 01/10/23   Darlean Ozell NOVAK, MD  diltiazem  (CARDIZEM  CD) 120 MG 24 hr capsule Take 1 capsule (120 mg total) by mouth daily. PATIENT MUST ATTEND UPCOMING APPOINTMENT FOR FURTHER REFILLS 12/19/23   Walker, Caitlin S, NP  doxycycline (VIBRA-TABS) 100 MG tablet Take 100 mg by mouth 2 (two) times daily. 02/03/23   [provider]  doxylamine , Sleep, (UNISOM ) 25 MG tablet Take 25 mg by mouth at bedtime as needed for sleep.    [provider]  Elderberry 500 MG CAPS Take 500 mg by mouth daily.    [provider]  latanoprost (XALATAN) 0.005 % ophthalmic solution Place 1 drop into both eyes at bedtime. 09/07/19   [provider]  olmesartan  (BENICAR ) 40 MG tablet Take 1 tablet by mouth once daily 11/18/23   Raford Riggs, MD  Omega-3 Fatty Acids (FISH OIL) 1000 MG CAPS Take 2 capsules by mouth daily.    [provider]  predniSONE  (DELTASONE ) 50 MG tablet 1 tablet with food Orally Once a day for 5 day(s) 02/17/23   [provider]  timolol (TIMOPTIC) 0.5 % ophthalmic solution Place 1 drop into both eyes every morning. 08/17/19   [provider]    Allergies: Other, Streptogramins, Codeine, Lisinopril, and Losartan potassium-hctz    Review of Systems  Respiratory:  Positive  for cough.     Updated Vital Signs BP 126/67   Pulse 65   Temp 97.7 F (36.5 C)   Resp 13   SpO2 95%   Physical Exam Vitals and nursing note reviewed.  Constitutional:      Appearance: He is not toxic-appearing.  Pulmonary:     Effort: Pulmonary effort is normal.     Breath sounds: Rhonchi present.  Abdominal:     General: Abdomen is flat.     Palpations: Abdomen is soft.  Musculoskeletal:        General: No swelling.     Cervical back: Normal range of motion.     Right lower leg: No edema.     Left lower leg: No edema.  Neurological:     General: No focal deficit present.     Mental Status: He is alert.      (all labs ordered are listed, but only abnormal results are displayed) Labs Reviewed  COMPREHENSIVE METABOLIC PANEL WITH GFR - Abnormal; Notable for the following components:      Result Value   Potassium 5.4 (*)    CO2 18 (*)    Glucose, Bld 106 (*)    BUN 45 (*)    Creatinine, Ser 2.59 (*)    GFR, Estimated 26 (*)    All other components within normal limits  CBC WITH DIFFERENTIAL/PLATELET - Abnormal; Notable for the following components:   RBC 3.13 (*)    Hemoglobin 10.0 (*)    HCT 30.4 (*)    All other components within normal limits  RESP PANEL BY RT-PCR (RSV, FLU A&B, COVID)  [id number]  LACTIC ACID, PLASMA  URINALYSIS, W/ REFLEX TO CULTURE (INFECTION SUSPECTED)  LACTIC ACID, PLASMA    EKG: None  Radiology: CT CHEST ABDOMEN PELVIS WO CONTRAST Result Date: 02/11/2024 CLINICAL DATA:  Concern for sepsis. BP being 90s/60s. Denies symptoms other than URI EXAM: CT CHEST, ABDOMEN AND PELVIS WITHOUT CONTRAST TECHNIQUE: Multidetector CT imaging of the chest, abdomen and pelvis was performed following the standard protocol without IV contrast. RADIATION DOSE REDUCTION: This exam was performed according to the departmental dose-optimization program which includes automated exposure control, adjustment of the mA and/or kV according to patient size and/or use of iterative reconstruction technique. COMPARISON:  CT chest 03/10/2023 FINDINGS: CT CHEST FINDINGS Cardiovascular: Normal heart size. Trace pericardial effusion. The thoracic aorta is normal in caliber. Mild atherosclerotic plaque of the thoracic aorta. Three-vessel coronary artery calcifications. Mediastinum/Nodes: No gross hilar adenopathy, noting limited sensitivity for the detection of hilar adenopathy on this noncontrast study. No enlarged mediastinal or axillary lymph nodes. Thyroid  gland, trachea, and esophagus demonstrate no significant findings. Lungs/Pleura: Mild centrilobular emphysematous changes. Diffuse mild bronchial wall  thickening. Lingular atelectasis. No focal consolidation. 5 x 6 mm subpleural left lower lobe pulmonary nodule (3:101). Stable 7 x 2 mm subpleural left lower lobe nodule (3:110). No pulmonary mass. No pleural effusion. No pneumothorax. Musculoskeletal: No chest wall abnormality. No suspicious lytic or blastic osseous lesions. No acute displaced fracture. Multilevel degenerative changes of the spine. CT ABDOMEN PELVIS FINDINGS Hepatobiliary: No focal liver abnormality. Calcified gallstone and gallbladder sludge noted within the gallbladder lumen. No gallbladder wall thickening or pericholecystic fluid. No biliary dilatation. Pancreas: No focal lesion. Normal pancreatic contour. No surrounding inflammatory changes. No main pancreatic ductal dilatation. Spleen: Normal in size without focal abnormality. Adrenals/Urinary Tract: No adrenal nodule bilaterally. No nephrolithiasis and no hydronephrosis. Nonspecific bilateral perinephric fat stranding. Fluid density lesions of the kidneys likely represent  simple renal cysts. Simple renal cysts, in the absence of clinically indicated signs/symptoms, require no independent follow-up. No ureterolithiasis or hydroureter. The urinary bladder is unremarkable. Stomach/Bowel: Stomach is within normal limits. No evidence of bowel wall thickening or dilatation. Colonic diverticulosis. Appendix appears normal. Vascular/Lymphatic: No abdominal aorta or iliac aneurysm. Severe atherosclerotic plaque of the aorta and its branches. No abdominal, pelvic, or inguinal lymphadenopathy. Reproductive: Prostate is unremarkable. Other: No intraperitoneal free fluid. No intraperitoneal free gas. No organized fluid collection. Musculoskeletal: Tiny fat containing umbilical hernia. No suspicious lytic or blastic osseous lesions. No acute displaced fracture. IMPRESSION: 1. No acute intrathoracic abnormality. 2. Cholelithiasis with no CT evidence of acute cholecystitis. 3. Colonic diverticulosis with no  acute diverticulitis. 4. A 5 x 6 mm subpleural left lower lobe pulmonary nodule. Non-contrast chest CT at 6-12 months is recommended. If the nodule is stable at time of repeat CT, then future CT at 18-24 months (from today's scan) is considered optional for low-risk patients, but is recommended for high-risk patients. This recommendation follows the consensus statement: Guidelines for Management of Incidental Pulmonary Nodules Detected on CT Images: From the Fleischner Society 2017; Radiology 2017; 284:228-243. 5. Aortic Atherosclerosis (ICD10-I70.0) and Emphysema (ICD10-J43.9). Electronically Signed   By: Morgane  Naveau M.D.   On: 02/11/2024 19:02     Procedures   Medications Ordered in the ED  sodium chloride  0.9 % bolus 1,000 mL (1,000 mLs Intravenous New Bag/Given 02/11/24 1925)                                    Medical Decision Making 73 year old male here today with cough, lab abnormalities increased urinary frequency.  Differential diagnoses include pneumonia, BPH, consider sepsis, dehydration, post renal AKI.  Plan -will obtain imaging of the patient's abdomen pelvis given the worsening of his creatinine.  Patient's baseline appears to be 1.2 this past summer, patient was able to show me labs which show a creatinine of 2.85 at an urgent care yesterday.  Could be some obstructive process leading to his worsening renal function, may be prerenal.  Since rescanning the patient's abdomen, may as well take a look at his chest to evaluate for pneumonia.  Patient is normotensive here in the ED.    Reassessment 7:45 PM-patient's blood pressure has remained normal.  His CT scan of his chest did show a pulmonary nodule.  I informed the patient of this.  He can follow-up with that on an outpatient basis.  There is no other acute process in the patient's chest abdomen pelvis.  His renal function is indeed decreased.  Patient requires admission for acute kidney injury.  Unclear etiology, perhaps related  to recent cold medicine use?  Will admit to hospitalist service.  Amount and/or Complexity of Data Reviewed Labs: ordered. Radiology: ordered.  Risk Decision regarding hospitalization.        Final diagnoses:  Acute kidney injury    ED Discharge Orders     None          Mannie Fairy DASEN, DO 02/11/24 1945

## 2024-02-11 NOTE — Plan of Care (Signed)
 Plan of Care Note for accepted transfer  Patient: Kenneth Weber    FMW:[id number]  DOA: 02/11/2024     Facility requesting transfer: [facility] ED  Requesting Provider: Mannie Fairy DASEN, DO  Reason for transfer: AKI Facility course:   85 M with hx of persistent atrial fibrillation, HTN, COPD, who had recent URI symptoms. was referred to ED for low BP and labs showing AKI. Remote Cr 1.2 -> 2.5 in ED, associated mild hyperkalemia 5.4. CT C/A/P without acute abnl. treated with 1L NS. Advised to treat with lokelma 10 g x 1, and start on NS at 100 cc/hr   Plan of care: The patient is accepted for admission to Telemetry unit, at [facility]    Author: Dorn Dawson, MD  02/11/2024  Check [email or web address] for on-call coverage.  Nursing staff, Please call [facility] Admits & Consults System-Wide number on Amion as soon as patient's arrival, so appropriate admitting provider can evaluate the pt.

## 2024-02-11 NOTE — ED Triage Notes (Signed)
 States he was at PCP and told to come here d/t BP being 90s/60s. Denies symptoms other than URI that is being treated for with antibiotics and steroids.

## 2024-02-11 NOTE — ED Notes (Signed)
 Pt aware of need for urine sample, unable to provide at this time.

## 2024-02-11 NOTE — ED Notes (Signed)
 Sent 1st set of blood cultures to lab.

## 2024-02-12 DIAGNOSIS — N179 Acute kidney failure, unspecified: Secondary | ICD-10-CM

## 2024-02-12 DIAGNOSIS — E875 Hyperkalemia: Secondary | ICD-10-CM | POA: Insufficient documentation

## 2024-02-12 DIAGNOSIS — R911 Solitary pulmonary nodule: Secondary | ICD-10-CM | POA: Insufficient documentation

## 2024-02-12 DIAGNOSIS — E785 Hyperlipidemia, unspecified: Secondary | ICD-10-CM | POA: Insufficient documentation

## 2024-02-12 LAB — BASIC METABOLIC PANEL WITH GFR
Anion gap: 11 (ref 5–15)
BUN: 38 mg/dL — ABNORMAL HIGH (ref 8–23)
CO2: 17 mmol/L — ABNORMAL LOW (ref 22–32)
Calcium: 8.7 mg/dL — ABNORMAL LOW (ref 8.9–10.3)
Chloride: 108 mmol/L (ref 98–111)
Creatinine, Ser: 1.94 mg/dL — ABNORMAL HIGH (ref 0.61–1.24)
GFR, Estimated: 36 mL/min — ABNORMAL LOW (ref >60)
Glucose, Bld: 222 mg/dL — ABNORMAL HIGH (ref 70–99)
Potassium: 4.9 mmol/L (ref 3.5–5.1)
Sodium: 136 mmol/L (ref 135–145)

## 2024-02-12 LAB — MAGNESIUM: Magnesium: 2.1 mg/dL (ref 1.7–2.4)

## 2024-02-12 LAB — CBC
HCT: 29.7 % — ABNORMAL LOW (ref 39.0–52.0)
Hemoglobin: 10.1 g/dL — ABNORMAL LOW (ref 13.0–17.0)
MCH: 32.7 pg (ref 26.0–34.0)
MCHC: 34 g/dL (ref 30.0–36.0)
MCV: 96.1 fL (ref 80.0–100.0)
Platelets: 246 K/uL (ref 150–400)
RBC: 3.09 MIL/uL — ABNORMAL LOW (ref 4.22–5.81)
RDW: 11.9 % (ref 11.5–15.5)
WBC: 9.6 K/uL (ref 4.0–10.5)
nRBC: 0 % (ref 0.0–0.2)

## 2024-02-12 LAB — GLUCOSE, CAPILLARY
Glucose-Capillary: 111 mg/dL — ABNORMAL HIGH (ref 70–99)
Glucose-Capillary: 130 mg/dL — ABNORMAL HIGH (ref 70–99)
Glucose-Capillary: 161 mg/dL — ABNORMAL HIGH (ref 70–99)

## 2024-02-12 LAB — PHOSPHORUS: Phosphorus: 3 mg/dL (ref 2.5–4.6)

## 2024-02-12 MED ORDER — APIXABAN 5 MG PO TABS: 5.0000 mg | ORAL_TABLET | Freq: Two times a day (BID) | ORAL | Status: DC

## 2024-02-12 MED ORDER — ACETAMINOPHEN 650 MG RE SUPP: 650.0000 mg | Freq: Four times a day (QID) | RECTAL | Status: DC | PRN

## 2024-02-12 MED ORDER — HEPARIN SODIUM (PORCINE) 5000 UNIT/ML IJ SOLN
5000.0000 [IU] | Freq: Three times a day (TID) | INTRAMUSCULAR | Status: DC
  Administered 2024-02-12: 5000 [IU] via SUBCUTANEOUS
  Filled 2024-02-12: qty 1

## 2024-02-12 MED ORDER — BUDESON-GLYCOPYRROL-FORMOTEROL 160-9-4.8 MCG/ACT IN AERO
2.0000 | INHALATION_SPRAY | RESPIRATORY_TRACT | Status: DC
  Administered 2024-02-12 – 2024-02-14 (×4): 2 via RESPIRATORY_TRACT
  Filled 2024-02-12: qty 5.9

## 2024-02-12 MED ORDER — ACETAMINOPHEN 500 MG PO TABS
1000.0000 mg | ORAL_TABLET | Freq: Four times a day (QID) | ORAL | Status: DC | PRN
  Administered 2024-02-12: 1000 mg via ORAL
  Filled 2024-02-12: qty 2

## 2024-02-12 MED ORDER — AZITHROMYCIN 500 MG PO TABS
250.0000 mg | ORAL_TABLET | Freq: Every day | ORAL | Status: DC
  Administered 2024-02-13 – 2024-02-14 (×2): 250 mg via ORAL
  Filled 2024-02-12 (×2): qty 1

## 2024-02-12 MED ORDER — LATANOPROST 0.005 % OP SOLN
1.0000 [drp] | Freq: Every day | OPHTHALMIC | Status: DC
  Administered 2024-02-13: 1 [drp] via OPHTHALMIC
  Filled 2024-02-12: qty 2.5

## 2024-02-12 MED ORDER — ONDANSETRON HCL 4 MG PO TABS: 4.0000 mg | ORAL_TABLET | Freq: Four times a day (QID) | ORAL | Status: DC | PRN

## 2024-02-12 MED ORDER — HEPARIN SODIUM (PORCINE) 5000 UNIT/ML IJ SOLN
5000.0000 [IU] | Freq: Three times a day (TID) | INTRAMUSCULAR | Status: DC
  Administered 2024-02-12 – 2024-02-14 (×7): 5000 [IU] via SUBCUTANEOUS
  Filled 2024-02-12 (×7): qty 1

## 2024-02-12 MED ORDER — MELATONIN 5 MG PO TABS: 5.0000 mg | ORAL_TABLET | Freq: Every evening | ORAL | Status: DC | PRN

## 2024-02-12 MED ORDER — ALBUTEROL SULFATE (2.5 MG/3ML) 0.083% IN NEBU: 2.5000 mg | INHALATION_SOLUTION | RESPIRATORY_TRACT | Status: DC | PRN

## 2024-02-12 MED ORDER — AZITHROMYCIN 500 MG PO TABS
500.0000 mg | ORAL_TABLET | Freq: Every day | ORAL | Status: AC
  Administered 2024-02-12: 500 mg via ORAL
  Filled 2024-02-12: qty 1

## 2024-02-12 MED ORDER — DILTIAZEM HCL ER COATED BEADS 120 MG PO CP24
120.0000 mg | ORAL_CAPSULE | Freq: Every day | ORAL | Status: DC
  Administered 2024-02-12 – 2024-02-14 (×2): 120 mg via ORAL
  Filled 2024-02-12 (×3): qty 1

## 2024-02-12 MED ORDER — ACETAMINOPHEN 325 MG PO TABS
650.0000 mg | ORAL_TABLET | Freq: Four times a day (QID) | ORAL | Status: DC | PRN
  Administered 2024-02-12 – 2024-02-13 (×3): 650 mg via ORAL
  Filled 2024-02-12 (×3): qty 2

## 2024-02-12 MED ORDER — GUAIFENESIN ER 600 MG PO TB12
1200.0000 mg | ORAL_TABLET | Freq: Two times a day (BID) | ORAL | Status: DC
  Administered 2024-02-12 – 2024-02-14 (×5): 1200 mg via ORAL
  Filled 2024-02-12 (×5): qty 2

## 2024-02-12 MED ORDER — TIMOLOL MALEATE 0.5 % OP SOLN
1.0000 [drp] | Freq: Every morning | OPHTHALMIC | Status: DC
Start: 2024-02-12 — End: 2024-02-14
  Filled 2024-02-12: qty 5

## 2024-02-12 MED ORDER — INSULIN ASPART 100 UNIT/ML IJ SOLN: 0.0000 [IU] | Freq: Every day | INTRAMUSCULAR | Status: DC

## 2024-02-12 MED ORDER — INSULIN ASPART 100 UNIT/ML IJ SOLN
0.0000 [IU] | Freq: Three times a day (TID) | INTRAMUSCULAR | Status: DC
  Administered 2024-02-12: 2 [IU] via SUBCUTANEOUS
  Administered 2024-02-12 – 2024-02-14 (×3): 1 [IU] via SUBCUTANEOUS

## 2024-02-12 MED ORDER — AMLODIPINE BESYLATE 5 MG PO TABS
2.5000 mg | ORAL_TABLET | Freq: Every day | ORAL | Status: DC
  Administered 2024-02-12 – 2024-02-14 (×3): 2.5 mg via ORAL
  Filled 2024-02-12 (×3): qty 1

## 2024-02-12 MED ORDER — SODIUM CHLORIDE 0.9 % IV SOLN: INTRAVENOUS | Status: AC

## 2024-02-12 MED ORDER — ONDANSETRON HCL 4 MG/2ML IJ SOLN: 4.0000 mg | Freq: Four times a day (QID) | INTRAMUSCULAR | Status: DC | PRN

## 2024-02-12 MED ORDER — DOXYLAMINE SUCCINATE (SLEEP) 25 MG PO TABS
25.0000 mg | ORAL_TABLET | Freq: Every evening | ORAL | Status: DC | PRN
  Administered 2024-02-12 – 2024-02-13 (×2): 25 mg via ORAL
  Filled 2024-02-12 (×3): qty 1

## 2024-02-12 MED ORDER — PREDNISONE 20 MG PO TABS
40.0000 mg | ORAL_TABLET | Freq: Every day | ORAL | Status: DC
  Administered 2024-02-13 – 2024-02-14 (×2): 40 mg via ORAL
  Filled 2024-02-12 (×2): qty 2

## 2024-02-12 NOTE — Hospital Course (Addendum)
 SABRA

## 2024-02-12 NOTE — Plan of Care (Signed)
   Problem: Coping: Goal: Level of anxiety will decrease Outcome: Progressing

## 2024-02-12 NOTE — Care Management Obs Status (Signed)
 MEDICARE OBSERVATION STATUS NOTIFICATION   Patient Details  Name: Kenneth Weber MRN: [id number] Date of Birth: 02/12/1951   Medicare Observation Status Notification Given:  Yes  Verbally reviewed observation notice with [name] telephonically at [phone].  Will deliver a copy to the patients room.  Izael Bessinger 02/12/2024, 11:14 AM

## 2024-02-12 NOTE — Plan of Care (Signed)
 Problem: Education: Goal: Knowledge of General Education information will improve Description: Including pain rating scale, medication(s)/side effects and non-pharmacologic comfort measures 02/12/2024 1038 by Shela Lapine, RN Outcome: Progressing 02/12/2024 0829 by Shela Lapine, RN Outcome: Progressing   Problem: Health Behavior/Discharge Planning: Goal: Ability to manage health-related needs will improve 02/12/2024 1038 by Shela Lapine, RN Outcome: Progressing 02/12/2024 0829 by Shela Lapine, RN Outcome: Progressing   Problem: Clinical Measurements: Goal: Ability to maintain clinical measurements within normal limits will improve 02/12/2024 1038 by Shela Lapine, RN Outcome: Progressing 02/12/2024 0829 by Shela Lapine, RN Outcome: Progressing Goal: Will remain free from infection 02/12/2024 1038 by Shela Lapine, RN Outcome: Progressing 02/12/2024 0829 by Shela Lapine, RN Outcome: Progressing Goal: Diagnostic test results will improve 02/12/2024 1038 by Shela Lapine, RN Outcome: Progressing 02/12/2024 0829 by Shela Lapine, RN Outcome: Progressing Goal: Respiratory complications will improve 02/12/2024 1038 by Shela Lapine, RN Outcome: Progressing 02/12/2024 0829 by Shela Lapine, RN Outcome: Progressing Goal: Cardiovascular complication will be avoided 02/12/2024 1038 by Shela Lapine, RN Outcome: Progressing 02/12/2024 0829 by Shela Lapine, RN Outcome: Progressing   Problem: Activity: Goal: Risk for activity intolerance will decrease 02/12/2024 1038 by Shela Lapine, RN Outcome: Progressing 02/12/2024 0829 by Shela Lapine, RN Outcome: Progressing   Problem: Nutrition: Goal: Adequate nutrition will be maintained 02/12/2024 1038 by Shela Lapine, RN Outcome: Progressing 02/12/2024 0829 by Shela Lapine, RN Outcome: Progressing   Problem: Coping: Goal: Level of anxiety will decrease 02/12/2024 1038 by Shela Lapine, RN Outcome:  Progressing 02/12/2024 0829 by Shela Lapine, RN Outcome: Progressing   Problem: Elimination: Goal: Will not experience complications related to bowel motility 02/12/2024 1038 by Shela Lapine, RN Outcome: Progressing 02/12/2024 0829 by Shela Lapine, RN Outcome: Progressing Goal: Will not experience complications related to urinary retention 02/12/2024 1038 by Shela Lapine, RN Outcome: Progressing 02/12/2024 0829 by Shela Lapine, RN Outcome: Progressing   Problem: Pain Managment: Goal: General experience of comfort will improve and/or be controlled 02/12/2024 1038 by Shela Lapine, RN Outcome: Progressing 02/12/2024 0829 by Shela Lapine, RN Outcome: Progressing   Problem: Safety: Goal: Ability to remain free from injury will improve 02/12/2024 1038 by Shela Lapine, RN Outcome: Progressing 02/12/2024 0829 by Shela Lapine, RN Outcome: Progressing   Problem: Skin Integrity: Goal: Risk for impaired skin integrity will decrease 02/12/2024 1038 by Shela Lapine, RN Outcome: Progressing 02/12/2024 0829 by Shela Lapine, RN Outcome: Progressing   Problem: Education: Goal: Knowledge of disease or condition will improve 02/12/2024 1038 by Shela Lapine, RN Outcome: Progressing 02/12/2024 0829 by Shela Lapine, RN Outcome: Progressing Goal: Knowledge of the prescribed therapeutic regimen will improve 02/12/2024 1038 by Shela Lapine, RN Outcome: Progressing 02/12/2024 0829 by Shela Lapine, RN Outcome: Progressing Goal: Individualized Educational Video(s) 02/12/2024 1038 by Shela Lapine, RN Outcome: Progressing 02/12/2024 0829 by Shela Lapine, RN Outcome: Progressing   Problem: Activity: Goal: Ability to tolerate increased activity will improve 02/12/2024 1038 by Shela Lapine, RN Outcome: Progressing 02/12/2024 0829 by Shela Lapine, RN Outcome: Progressing Goal: Will verbalize the importance of balancing activity with adequate rest periods 02/12/2024 1038 by  Shela Lapine, RN Outcome: Progressing 02/12/2024 0829 by Shela Lapine, RN Outcome: Progressing   Problem: Respiratory: Goal: Ability to maintain a clear airway will improve 02/12/2024 1038 by Shela Lapine, RN Outcome: Progressing 02/12/2024 0829 by Shela Lapine, RN Outcome: Progressing Goal: Levels of oxygenation will improve 02/12/2024 1038 by Shela Lapine, RN Outcome: Progressing 02/12/2024 0829 by Shela Lapine, RN Outcome: Progressing Goal: Ability to maintain adequate ventilation will improve 02/12/2024 1038  by Shela Lapine, RN Outcome: Progressing 02/12/2024 0829 by Shela Lapine, RN Outcome: Progressing   Problem: Education: Goal: Knowledge of disease or condition will improve 02/12/2024 1038 by Shela Lapine, RN Outcome: Progressing 02/12/2024 0829 by Shela Lapine, RN Outcome: Progressing Goal: Knowledge of the prescribed therapeutic regimen will improve 02/12/2024 1038 by Shela Lapine, RN Outcome: Progressing 02/12/2024 0829 by Shela Lapine, RN Outcome: Progressing Goal: Individualized Educational Video(s) 02/12/2024 1038 by Shela Lapine, RN Outcome: Progressing 02/12/2024 0829 by Shela Lapine, RN Outcome: Progressing   Problem: Activity: Goal: Ability to tolerate increased activity will improve 02/12/2024 1038 by Shela Lapine, RN Outcome: Progressing 02/12/2024 0829 by Shela Lapine, RN Outcome: Progressing Goal: Will verbalize the importance of balancing activity with adequate rest periods 02/12/2024 1038 by Shela Lapine, RN Outcome: Progressing 02/12/2024 0829 by Shela Lapine, RN Outcome: Progressing   Problem: Respiratory: Goal: Ability to maintain a clear airway will improve 02/12/2024 1038 by Shela Lapine, RN Outcome: Progressing 02/12/2024 0829 by Shela Lapine, RN Outcome: Progressing Goal: Levels of oxygenation will improve 02/12/2024 1038 by Shela Lapine, RN Outcome: Progressing 02/12/2024 0829 by Shela Lapine,  RN Outcome: Progressing Goal: Ability to maintain adequate ventilation will improve 02/12/2024 1038 by Shela Lapine, RN Outcome: Progressing 02/12/2024 0829 by Shela Lapine, RN Outcome: Progressing   Problem: Education: Goal: Ability to describe self-care measures that may prevent or decrease complications (Diabetes Survival Skills Education) will improve Outcome: Progressing Goal: Individualized Educational Video(s) Outcome: Progressing   Problem: Coping: Goal: Ability to adjust to condition or change in health will improve Outcome: Progressing   Problem: Fluid Volume: Goal: Ability to maintain a balanced intake and output will improve Outcome: Progressing   Problem: Health Behavior/Discharge Planning: Goal: Ability to identify and utilize available resources and services will improve Outcome: Progressing Goal: Ability to manage health-related needs will improve Outcome: Progressing   Problem: Metabolic: Goal: Ability to maintain appropriate glucose levels will improve Outcome: Progressing   Problem: Nutritional: Goal: Maintenance of adequate nutrition will improve Outcome: Progressing Goal: Progress toward achieving an optimal weight will improve Outcome: Progressing   Problem: Skin Integrity: Goal: Risk for impaired skin integrity will decrease Outcome: Progressing   Problem: Tissue Perfusion: Goal: Adequacy of tissue perfusion will improve Outcome: Progressing

## 2024-02-12 NOTE — H&P (Signed)
 History and Physical    Kenneth Weber FMW:[id number] DOB: Jul 30, 1950 DOA: 02/11/2024  DOS: the patient was seen and examined on 02/11/2024  PCP: Seabron Lenis, MD   Patient coming from: Home  I have personally briefly reviewed patient's old medical records in [facility] and CareEverywhere  HPI:   Kenneth Weber is a 73 y.o. year old male with past medical history of dyslipidemia, hypertension, atrial fibrillation, COPD, and persistent iron deficiency anemia secondary to chronic blood loss.  Yesterday Kenneth Weber presented to his PCP with URI symptoms to include productive cough with yellow sputum, sore throat, mild dyspnea since last Thursday that has not improved with Mucinex  DM.  PCP started him on steroids and antibiotics and drew labs.  Kenneth Weber received a phone call from PCP later in the evening advising him to present to the ED due to elevated creatinine.  Denies fevers, chills, weakness, or new myalgias.  Does endorse ongoing arthralgias and myalgias secondary to statin use.  Endorses urinary frequency reporting he has gotten up multiple times throughout the night recently to use the bathroom which is abnormal for him.   ED Course: On arrival to [facility] ED patient was noted to be afebrile temp 36.5 C, BP 137/59, HR 71, RR 16, SpO2 100% on room air.  CT chest abdomen pelvis obtained and shows cholelithiasis without evidence of cholecystitis, diverticulosis without evidence of diverticulitis, and a 5 x 6 mm subpleural left lower lobe pulmonary nodule.  Labs notable for BUN 45, creatinine 2.59, potassium 5.4, CO2 18, and hemoglobin 10.  He was given 1 L normal saline bolus and started on continuous IVF at 100 mL/h and 5 g of Lokelma. TR[facility] contacted for admission.  Review of Systems:   Review of Systems  Constitutional:  Positive for malaise/fatigue. Negative for chills and fever.  HENT:  Negative for congestion.   Respiratory:  Positive for cough, sputum production and  shortness of breath. Negative for wheezing.   Cardiovascular:  Negative for chest pain, palpitations and leg swelling.  Gastrointestinal:  Negative for heartburn, nausea and vomiting.  Genitourinary:  Positive for frequency.  Musculoskeletal:  Positive for joint pain and myalgias.  Neurological:  Negative for focal weakness, weakness and headaches.  Endo/Heme/Allergies:  Bruises/bleeds easily.  Psychiatric/Behavioral:  The patient has insomnia.     Past Medical History:  Diagnosis Date   Allergic rhinitis    Anemia    Arthritis    Chest pain    COPD (chronic obstructive pulmonary disease) (HCC)    ED (erectile dysfunction)    ED (erectile dysfunction) 06/19/2022   Hyperlipidemia    Hypertension    Pneumonia    SOB (shortness of breath)     Past Surgical History:  Procedure Laterality Date   COLONOSCOPY N/A 01/27/2024   Procedure: COLONOSCOPY;  Surgeon: Rosalie Kitchens, MD;  Location: [facility] ENDOSCOPY;  Service: Gastroenterology;  Laterality: N/A;  APC   COLONOSCOPY WITH PROPOFOL  N/A 07/24/2022   Procedure: COLONOSCOPY WITH PROPOFOL ;  Surgeon: Saintclair Jasper, MD;  Location: [facility] ENDOSCOPY;  Service: Gastroenterology;  Laterality: N/A;   ESOPHAGOGASTRODUODENOSCOPY N/A 04/30/2022   Procedure: ESOPHAGOGASTRODUODENOSCOPY (EGD);  Surgeon: Rosalie Kitchens, MD;  Location: [facility] ENDOSCOPY;  Service: Gastroenterology;  Laterality: N/A;   POLYPECTOMY  07/24/2022   Procedure: POLYPECTOMY;  Surgeon: Saintclair Jasper, MD;  Location: [facility] ENDOSCOPY;  Service: Gastroenterology;;     reports that he quit smoking about 10 years ago. His smoking use included cigarettes. He started smoking about 50 years ago. He has  a 60 pack-year smoking history. He has never used smokeless tobacco. He reports that he does not drink alcohol and does not use drugs.  Allergies  Allergen Reactions   Streptogramins    Codeine Nausea And Vomiting   Lisinopril Cough   Losartan Potassium-Hctz Other (See Comments)    Erectile Dysfunction     Family History  Problem Relation Age of Onset   Hypertension Mother    Hypertension Father    CAD Father    Colon cancer Father    CAD Brother     Prior to Admission medications   Medication Sig Start Date End Date Taking? Authorizing Provider  acetaminophen  (TYLENOL ) 500 MG tablet Take 1,000 mg by mouth every 6 (six) hours as needed for headache.    [provider]  albuterol  (VENTOLIN  HFA) 108 (90 Base) MCG/ACT inhaler Up to 2 puffs every 4 hours as needed 12/19/23   Wert, Michael B, MD  amLODipine  (NORVASC ) 2.5 MG tablet Take 1 tablet by mouth once daily 11/20/23   O'Neal, Darryle Ned, MD  amoxicillin -clavulanate (AUGMENTIN ) 875-125 MG tablet Take 1 tablet by mouth 2 (two) times daily. 02/17/23   [provider]  apixaban  (ELIQUIS ) 5 MG TABS tablet Take 1 tablet (5 mg total) by mouth 2 (two) times daily. 01/02/23   Vannie Reche RAMAN, NP  apixaban  (ELIQUIS ) 5 MG TABS tablet Take 1 tablet by mouth twice daily 10/20/23   O'Neal, Darryle Ned, MD  ascorbic acid (VITAMIN C) 500 MG tablet Take 500 mg by mouth daily.    [provider]  atorvastatin  (LIPITOR) 20 MG tablet TAKE 1 TABLET BY MOUTH AT BEDTIME 11/20/23   O'Neal, Darryle Ned, MD  Budeson-Glycopyrrol-Formoterol (BREZTRI  AEROSPHERE) 160-9-4.8 MCG/ACT AERO Take 2 puffs first thing in am and then another 2 puffs about 12 hours later. 01/10/23   Darlean Ozell NOVAK, MD  diltiazem  (CARDIZEM  CD) 120 MG 24 hr capsule Take 1 capsule (120 mg total) by mouth daily. PATIENT MUST ATTEND UPCOMING APPOINTMENT FOR FURTHER REFILLS 12/19/23   Walker, Caitlin S, NP  doxycycline (VIBRA-TABS) 100 MG tablet Take 100 mg by mouth 2 (two) times daily. 02/03/23   [provider]  doxylamine , Sleep, (UNISOM ) 25 MG tablet Take 25 mg by mouth at bedtime as needed for sleep.    [provider]  Elderberry 500 MG CAPS Take 500 mg by mouth daily.    [provider]  latanoprost (XALATAN) 0.005 % ophthalmic solution  Place 1 drop into both eyes at bedtime. 09/07/19   [provider]  olmesartan  (BENICAR ) 40 MG tablet Take 1 tablet by mouth once daily 11/18/23   Raford Riggs, MD  Omega-3 Fatty Acids (FISH OIL) 1000 MG CAPS Take 2 capsules by mouth daily.    [provider]  predniSONE  (DELTASONE ) 50 MG tablet 1 tablet with food Orally Once a day for 5 day(s) 02/17/23   [provider]  timolol (TIMOPTIC) 0.5 % ophthalmic solution Place 1 drop into both eyes every morning. 08/17/19   [provider]    Physical Exam: Vitals:   02/11/24 2245 02/11/24 2300 02/11/24 2352 02/12/24 0355  BP: 130/66  (!) 144/70 121/66  Pulse: 64  68 69  Resp: 13  20 20   Temp:  98.3 F (36.8 C) 97.8 F (36.6 C) 97.8 F (36.6 C)  TempSrc:  Oral Oral Oral  SpO2: 96%  95% 97%  Weight:   85.2 kg   Height:   6' (1.829 m)  Physical Exam Vitals and nursing note reviewed.  Constitutional:      General: He is not in acute distress.    Appearance: He is not ill-appearing.  HENT:     Head: Normocephalic.     Nose: No congestion.  Eyes:     Pupils: Pupils are equal, round, and reactive to light.  Cardiovascular:     Rate and Rhythm: Normal rate and regular rhythm.     Pulses: Normal pulses.     Heart sounds: Normal heart sounds. No murmur heard.    No friction rub. No gallop.  Pulmonary:     Effort: Pulmonary effort is normal. No respiratory distress.     Breath sounds: Normal breath sounds. No wheezing, rhonchi or rales.  Abdominal:     General: Bowel sounds are normal. There is no distension.     Palpations: Abdomen is soft.     Tenderness: There is no abdominal tenderness.  Musculoskeletal:        General: Normal range of motion.  Skin:    General: Skin is warm and dry.     Capillary Refill: Capillary refill takes less than 2 seconds.  Neurological:     General: No focal deficit present.     Mental Status: He is alert and oriented to person, place, and time.     Motor: No  weakness.  Psychiatric:        Mood and Affect: Mood normal.        Behavior: Behavior normal.     Labs on Admission: I have personally reviewed following labs and imaging studies  CBC: Recent Labs  Lab 02/11/24 1802  WBC 8.2  NEUTROABS 5.1  HGB 10.0*  HCT 30.4*  MCV 97.1  PLT 269   Basic Metabolic Panel: Recent Labs  Lab 02/11/24 1802  NA 139  K 5.4*  CL 106  CO2 18*  GLUCOSE 106*  BUN 45*  CREATININE 2.59*  CALCIUM  9.6   GFR: Estimated Creatinine Clearance: 28.3 mL/min (A) (by C-G formula based on SCr of 2.59 mg/dL (H)). Liver Function Tests: Recent Labs  Lab 02/11/24 1802  AST 23  ALT 11  ALKPHOS 91  BILITOT 0.2  PROT 7.2  ALBUMIN 4.5   No results for input(s): LIPASE, AMYLASE in the last 168 hours. No results for input(s): AMMONIA in the last 168 hours. Coagulation Profile: No results for input(s): INR, PROTIME in the last 168 hours. Cardiac Enzymes: No results for input(s): CKTOTAL, CKMB, CKMBINDEX, TROPONINI, TROPONINIHS in the last 168 hours. BNP (last 3 results) No results for input(s): BNP in the last 8760 hours. HbA1C: No results for input(s): HGBA1C in the last 72 hours. CBG: No results for input(s): GLUCAP in the last 168 hours. Lipid Profile: No results for input(s): CHOL, HDL, LDLCALC, TRIG, CHOLHDL, LDLDIRECT in the last 72 hours. Thyroid  Function Tests: No results for input(s): TSH, T4TOTAL, FREET4, T3FREE, THYROIDAB in the last 72 hours. Anemia Panel: No results for input(s): VITAMINB12, FOLATE, FERRITIN, TIBC, IRON, RETICCTPCT in the last 72 hours. Urine analysis:    Component Value Date/Time   COLORURINE YELLOW 02/11/2024 1953   APPEARANCEUR CLEAR 02/11/2024 1953   LABSPEC 1.013 02/11/2024 1953   PHURINE 5.0 02/11/2024 1953   GLUCOSEU NEGATIVE 02/11/2024 1953   HGBUR NEGATIVE 02/11/2024 1953   BILIRUBINUR NEGATIVE 02/11/2024 1953   KETONESUR NEGATIVE 02/11/2024 1953    PROTEINUR NEGATIVE 02/11/2024 1953   NITRITE NEGATIVE 02/11/2024 1953   LEUKOCYTESUR NEGATIVE 02/11/2024 1953    Radiological Exams  on Admission: I have personally reviewed images CT CHEST ABDOMEN PELVIS WO CONTRAST Result Date: 02/11/2024 CLINICAL DATA:  Concern for sepsis. BP being 90s/60s. Denies symptoms other than URI EXAM: CT CHEST, ABDOMEN AND PELVIS WITHOUT CONTRAST TECHNIQUE: Multidetector CT imaging of the chest, abdomen and pelvis was performed following the standard protocol without IV contrast. RADIATION DOSE REDUCTION: This exam was performed according to the departmental dose-optimization program which includes automated exposure control, adjustment of the mA and/or kV according to patient size and/or use of iterative reconstruction technique. COMPARISON:  CT chest 03/10/2023 FINDINGS: CT CHEST FINDINGS Cardiovascular: Normal heart size. Trace pericardial effusion. The thoracic aorta is normal in caliber. Mild atherosclerotic plaque of the thoracic aorta. Three-vessel coronary artery calcifications. Mediastinum/Nodes: No gross hilar adenopathy, noting limited sensitivity for the detection of hilar adenopathy on this noncontrast study. No enlarged mediastinal or axillary lymph nodes. Thyroid  gland, trachea, and esophagus demonstrate no significant findings. Lungs/Pleura: Mild centrilobular emphysematous changes. Diffuse mild bronchial wall thickening. Lingular atelectasis. No focal consolidation. 5 x 6 mm subpleural left lower lobe pulmonary nodule (3:101). Stable 7 x 2 mm subpleural left lower lobe nodule (3:110). No pulmonary mass. No pleural effusion. No pneumothorax. Musculoskeletal: No chest wall abnormality. No suspicious lytic or blastic osseous lesions. No acute displaced fracture. Multilevel degenerative changes of the spine. CT ABDOMEN PELVIS FINDINGS Hepatobiliary: No focal liver abnormality. Calcified gallstone and gallbladder sludge noted within the gallbladder lumen. No  gallbladder wall thickening or pericholecystic fluid. No biliary dilatation. Pancreas: No focal lesion. Normal pancreatic contour. No surrounding inflammatory changes. No main pancreatic ductal dilatation. Spleen: Normal in size without focal abnormality. Adrenals/Urinary Tract: No adrenal nodule bilaterally. No nephrolithiasis and no hydronephrosis. Nonspecific bilateral perinephric fat stranding. Fluid density lesions of the kidneys likely represent simple renal cysts. Simple renal cysts, in the absence of clinically indicated signs/symptoms, require no independent follow-up. No ureterolithiasis or hydroureter. The urinary bladder is unremarkable. Stomach/Bowel: Stomach is within normal limits. No evidence of bowel wall thickening or dilatation. Colonic diverticulosis. Appendix appears normal. Vascular/Lymphatic: No abdominal aorta or iliac aneurysm. Severe atherosclerotic plaque of the aorta and its branches. No abdominal, pelvic, or inguinal lymphadenopathy. Reproductive: Prostate is unremarkable. Other: No intraperitoneal free fluid. No intraperitoneal free gas. No organized fluid collection. Musculoskeletal: Tiny fat containing umbilical hernia. No suspicious lytic or blastic osseous lesions. No acute displaced fracture. IMPRESSION: 1. No acute intrathoracic abnormality. 2. Cholelithiasis with no CT evidence of acute cholecystitis. 3. Colonic diverticulosis with no acute diverticulitis. 4. A 5 x 6 mm subpleural left lower lobe pulmonary nodule. Non-contrast chest CT at 6-12 months is recommended. If the nodule is stable at time of repeat CT, then future CT at 18-24 months (from today's scan) is considered optional for low-risk patients, but is recommended for high-risk patients. This recommendation follows the consensus statement: Guidelines for Management of Incidental Pulmonary Nodules Detected on CT Images: From the Fleischner Society 2017; Radiology 2017; 284:228-243. 5. Aortic Atherosclerosis  (ICD10-I70.0) and Emphysema (ICD10-J43.9). Electronically Signed   By: Morgane  Naveau M.D.   On: 02/11/2024 19:02    EKG: My personal interpretation of EKG shows: NSR with intraventricular conduction delay, HR 68   Assessment/Plan Principal Problem:   Acute kidney injury Active Problems:   Hyperkalemia  ##Acute Kidney Injury - Hold nephrotoxic meds: Diuretic, NSAID, ACE, ARB - IVF hydration - Avoid nephrotoxins, contrast Dyes, Hypotension and Dehydration  - Repeat metabolic panel now  ##Hyperkalemia - Recheck BMP now - Redose Lokelma as needed  ##COPD exacerbation, mild -  Continue outpatient prednisone  - Z-Pak  -PRN Nebulizers - Continue home Breztri  - Mucinex  - Pulmonary toilet: Mobilize/incentive spirometry  ##Hyperglycemia In setting of Prednisone  use - AC/HS SSI and CBG monitoring - Check A1C with AM labs  ##5 x 6 mm subpleural left lower lobe pulmonary nodule - Outpatient follow up, Non-contrast CT recommended in 6-12 months  #Atrial fibrillation - Continue home diltiazem  - Home Eliquis  held outpatient secondary to anemia and concern for blood loss  #Hypertension - Continue home diltiazem  and amlodipine  - Hold home olmesartan  secondary to AKI  #Dyslipidemia - Hold home atorvastatin  secondary to patient report of arthralgias and myopathy   VTE prophylaxis: Heparin GI prophylaxis: Not indicated  Diet: Heart healthy Access: PIV Lines: None Code Status:  Full Code Telemetry: Yes  Admission status: Observation, Telemetry bed Patient is from: Home Anticipated d/c is to: Home Anticipated d/c date is: 1-2 days Patient currently: Receiving IV fluid hydration, pending improvement in kidney function and potassium  Family Communication: Wife Kenneth Weber at bedside  Consults called: N/A    Severity of Illness: The appropriate patient status for this patient is OBSERVATION. Observation status is judged to be reasonable and necessary in order to provide the  required intensity of service to ensure the patient's safety. The patient's presenting symptoms, physical exam findings, and initial radiographic and laboratory data in the context of their medical condition is felt to place them at decreased risk for further clinical deterioration. Furthermore, it is anticipated that the patient will be medically stable for discharge from the hospital within 2 midnights of admission.   To reach the provider On-Call:   7AM- 7PM see care teams to locate the attending and reach out to them via [email or web address] Password: [email or web address] 7PM-7AM contact night-coverage If you still have difficulty reaching the appropriate provider, please page the [facility] (Director on Call) for [facility] on amion for assistance  This document was prepared using Conservation officer, historic buildings and may include unintentional dictation errors.  Rockie Rams FNP-BC, PMHNP-BC Nurse Practitioner [facility]

## 2024-02-12 NOTE — Plan of Care (Signed)
  Problem: Education: Goal: Knowledge of General Education information will improve Description: Including pain rating scale, medication(s)/side effects and non-pharmacologic comfort measures Outcome: Progressing   Problem: Health Behavior/Discharge Planning: Goal: Ability to manage health-related needs will improve Outcome: Progressing   Problem: Clinical Measurements: Goal: Ability to maintain clinical measurements within normal limits will improve Outcome: Progressing Goal: Will remain free from infection Outcome: Progressing Goal: Diagnostic test results will improve Outcome: Progressing Goal: Respiratory complications will improve Outcome: Progressing Goal: Cardiovascular complication will be avoided Outcome: Progressing   Problem: Activity: Goal: Risk for activity intolerance will decrease Outcome: Progressing   Problem: Nutrition: Goal: Adequate nutrition will be maintained Outcome: Progressing   Problem: Coping: Goal: Level of anxiety will decrease Outcome: Progressing   Problem: Elimination: Goal: Will not experience complications related to bowel motility Outcome: Progressing Goal: Will not experience complications related to urinary retention Outcome: Progressing   Problem: Pain Managment: Goal: General experience of comfort will improve and/or be controlled Outcome: Progressing   Problem: Safety: Goal: Ability to remain free from injury will improve Outcome: Progressing   Problem: Skin Integrity: Goal: Risk for impaired skin integrity will decrease Outcome: Progressing   Problem: Education: Goal: Knowledge of disease or condition will improve Outcome: Progressing Goal: Knowledge of the prescribed therapeutic regimen will improve Outcome: Progressing Goal: Individualized Educational Video(s) Outcome: Progressing   Problem: Activity: Goal: Ability to tolerate increased activity will improve Outcome: Progressing Goal: Will verbalize the  importance of balancing activity with adequate rest periods Outcome: Progressing   Problem: Respiratory: Goal: Ability to maintain a clear airway will improve Outcome: Progressing Goal: Levels of oxygenation will improve Outcome: Progressing Goal: Ability to maintain adequate ventilation will improve Outcome: Progressing   Problem: Education: Goal: Knowledge of disease or condition will improve Outcome: Progressing Goal: Knowledge of the prescribed therapeutic regimen will improve Outcome: Progressing Goal: Individualized Educational Video(s) Outcome: Progressing   Problem: Activity: Goal: Ability to tolerate increased activity will improve Outcome: Progressing Goal: Will verbalize the importance of balancing activity with adequate rest periods Outcome: Progressing   Problem: Respiratory: Goal: Ability to maintain a clear airway will improve Outcome: Progressing Goal: Levels of oxygenation will improve Outcome: Progressing Goal: Ability to maintain adequate ventilation will improve Outcome: Progressing

## 2024-02-13 DIAGNOSIS — N179 Acute kidney failure, unspecified: Secondary | ICD-10-CM | POA: Diagnosis not present

## 2024-02-13 LAB — BASIC METABOLIC PANEL WITH GFR
Anion gap: 7 (ref 5–15)
BUN: 29 mg/dL — ABNORMAL HIGH (ref 8–23)
CO2: 19 mmol/L — ABNORMAL LOW (ref 22–32)
Calcium: 8.5 mg/dL — ABNORMAL LOW (ref 8.9–10.3)
Chloride: 113 mmol/L — ABNORMAL HIGH (ref 98–111)
Creatinine, Ser: 1.74 mg/dL — ABNORMAL HIGH (ref 0.61–1.24)
GFR, Estimated: 41 mL/min — ABNORMAL LOW (ref >60)
Glucose, Bld: 117 mg/dL — ABNORMAL HIGH (ref 70–99)
Potassium: 4.7 mmol/L (ref 3.5–5.1)
Sodium: 139 mmol/L (ref 135–145)

## 2024-02-13 LAB — GLUCOSE, CAPILLARY
Glucose-Capillary: 82 mg/dL (ref 70–99)
Glucose-Capillary: 124 mg/dL — ABNORMAL HIGH (ref 70–99)
Glucose-Capillary: 112 mg/dL — ABNORMAL HIGH (ref 70–99)
Glucose-Capillary: 107 mg/dL — ABNORMAL HIGH (ref 70–99)

## 2024-02-13 LAB — CBC
HCT: 28.1 % — ABNORMAL LOW (ref 39.0–52.0)
Hemoglobin: 9.3 g/dL — ABNORMAL LOW (ref 13.0–17.0)
MCH: 32.3 pg (ref 26.0–34.0)
MCHC: 33.1 g/dL (ref 30.0–36.0)
MCV: 97.6 fL (ref 80.0–100.0)
Platelets: 218 K/uL (ref 150–400)
RBC: 2.88 MIL/uL — ABNORMAL LOW (ref 4.22–5.81)
RDW: 11.9 % (ref 11.5–15.5)
WBC: 8.5 K/uL (ref 4.0–10.5)
nRBC: 0 % (ref 0.0–0.2)

## 2024-02-13 LAB — HEMOGLOBIN A1C
Hgb A1c MFr Bld: 5.4 % (ref 4.8–5.6)
Mean Plasma Glucose: 108.28 mg/dL

## 2024-02-13 NOTE — Progress Notes (Signed)
 PROGRESS NOTE    Kenneth Weber  FMW:[id number] DOB: 02-Jul-1950 DOA: 02/11/2024 PCP: Seabron Lenis, MD  Outpatient Specialists:     Brief Narrative:  As per prior documentation by the nurse practitioner: Kenneth Weber is a 73 y.o. year old male with past medical history of dyslipidemia, hypertension, atrial fibrillation, COPD, and persistent iron deficiency anemia secondary to chronic blood loss.  Yesterday Kenneth Weber presented to his PCP with URI symptoms to include productive cough with yellow sputum, sore throat, mild dyspnea since last Thursday that has not improved with Mucinex  DM.  PCP started him on steroids and antibiotics and drew labs.  Kenneth Weber received a phone call from PCP later in the evening advising him to present to the ED due to elevated creatinine.  Denies fevers, chills, weakness, or new myalgias.  Does endorse ongoing arthralgias and myalgias secondary to statin use.  Endorses urinary frequency reporting he has gotten up multiple times throughout the night recently to use the bathroom which is abnormal for him.    ED Course: On arrival to [facility] ED patient was noted to be afebrile temp 36.5 C, BP 137/59, HR 71, RR 16, SpO2 100% on room air.  CT chest abdomen pelvis obtained and shows cholelithiasis without evidence of cholecystitis, diverticulosis without evidence of diverticulitis, and a 5 x 6 mm subpleural left lower lobe pulmonary nodule.  Labs notable for BUN 45, creatinine 2.59, potassium 5.4, CO2 18, and hemoglobin 10.  He was given 1 L normal saline bolus and started on continuous IVF at 100 mL/h and 5 g of Lokelma. [facility] contacted for admission.  02/13/2024: Patient continues to report mild viral symptoms.  Overall, patient is improving.  Likely discharge home tomorrow.  Patient was significantly bradycardic earlier today.  Monitor patient overnight.   Assessment & Plan:   Principal Problem:   Acute kidney injury Active Problems:   Hypertension   COPD with  acute exacerbation (HCC)   Hyperkalemia   Dyslipidemia   Lung nodule seen on imaging study   ##Acute Kidney Injury - Hold nephrotoxic meds: Diuretic, NSAID, ACE, ARB - IVF hydration - Avoid nephrotoxins, contrast Dyes, Hypotension and Dehydration  - Repeat metabolic panel now 02/13/2024: Improving.  Serum creatinine has improved from 2.59-1.74.   ##Hyperkalemia - Recheck BMP now - Redose Lokelma as needed 02/13/2024: Resolved.  Potassium of 4.7.   ##COPD exacerbation, mild - Continue outpatient prednisone  - Z-Pak  -PRN Nebulizers - Continue home Breztri  - Mucinex  - Pulmonary toilet: Mobilize/incentive spirometry 02/13/2024: Mild expiratory wheeze noted.   ##Hyperglycemia In setting of Prednisone  use - AC/HS SSI and CBG monitoring - Check A1C with AM labs   ##5 x 6 mm subpleural left lower lobe pulmonary nodule - Outpatient follow up, Non-contrast CT recommended in 6-12 months   #Atrial fibrillation - Continue home diltiazem  - Home Eliquis  held outpatient secondary to anemia and concern for blood loss   #Hypertension - Continue home diltiazem  and amlodipine  - Hold home olmesartan  secondary to AKI   #Dyslipidemia - Hold home atorvastatin  secondary to patient report of arthralgias and myopathy     DVT prophylaxis: Subcutaneous heparin Code Status: Full code Family Communication:  Disposition Plan: Likely discharge tomorrow   Consultants:  None  Procedures:  None  Antimicrobials:  Oral azithromycin    Subjective: Reports mild viral symptoms (fatigue)  Objective: Vitals:   02/12/24 0846 02/12/24 1702 02/13/24 0740 02/13/24 1536  BP: 136/66 (!) 143/73 126/79 133/73  Pulse: 81 78 (!) 52 68  Resp: 18 18  Temp: 98 F (36.7 C) 98.2 F (36.8 C) 98.2 F (36.8 C) 98 F (36.7 C)  TempSrc: Oral  Oral Oral  SpO2: 98% 99% 98% 98%  Weight:      Height:        Intake/Output Summary (Last 24 hours) at 02/13/2024 1909 Last data filed at 02/13/2024  0740 Gross per 24 hour  Intake 220 ml  Output 700 ml  Net -480 ml   Filed Weights   02/11/24 2352  Weight: 85.2 kg    Examination:  General exam: Appears calm and comfortable  Respiratory system: Mild expiratory wheeze.   Cardiovascular system: S1 & S2 heard Gastrointestinal system: Abdomen is soft and nontender.  Central nervous system: Awake and alert.    Data Reviewed: I have personally reviewed following labs and imaging studies  CBC: Recent Labs  Lab 02/11/24 1802 02/12/24 0805 02/13/24 0331  WBC 8.2 9.6 8.5  NEUTROABS 5.1  --   --   HGB 10.0* 10.1* 9.3*  HCT 30.4* 29.7* 28.1*  MCV 97.1 96.1 97.6  PLT 269 246 218   Basic Metabolic Panel: Recent Labs  Lab 02/11/24 1802 02/12/24 0805 02/12/24 1154 02/13/24 0331  NA 139 136  --  139  K 5.4* 4.9  --  4.7  CL 106 108  --  113*  CO2 18* 17*  --  19*  GLUCOSE 106* 222*  --  117*  BUN 45* 38*  --  29*  CREATININE 2.59* 1.94*  --  1.74*  CALCIUM  9.6 8.7*  --  8.5*  MG  --  2.1  --   --   PHOS  --   --  3.0  --    GFR: Estimated Creatinine Clearance: 42.1 mL/min (A) (by C-G formula based on SCr of 1.74 mg/dL (H)). Liver Function Tests: Recent Labs  Lab 02/11/24 1802  AST 23  ALT 11  ALKPHOS 91  BILITOT 0.2  PROT 7.2  ALBUMIN 4.5   No results for input(s): LIPASE, AMYLASE in the last 168 hours. No results for input(s): AMMONIA in the last 168 hours. Coagulation Profile: No results for input(s): INR, PROTIME in the last 168 hours. Cardiac Enzymes: No results for input(s): CKTOTAL, CKMB, CKMBINDEX, TROPONINI in the last 168 hours. BNP (last 3 results) No results for input(s): PROBNP in the last 8760 hours. HbA1C: Recent Labs    02/13/24 0331  HGBA1C 5.4   CBG: Recent Labs  Lab 02/12/24 1659 02/12/24 2347 02/13/24 0744 02/13/24 1239 02/13/24 1712  GLUCAP 130* 111* 82 124* 112*   Lipid Profile: No results for input(s): CHOL, HDL, LDLCALC, TRIG, CHOLHDL,  LDLDIRECT in the last 72 hours. Thyroid  Function Tests: No results for input(s): TSH, T4TOTAL, FREET4, T3FREE, THYROIDAB in the last 72 hours. Anemia Panel: No results for input(s): VITAMINB12, FOLATE, FERRITIN, TIBC, IRON, RETICCTPCT in the last 72 hours. Urine analysis:    Component Value Date/Time   COLORURINE YELLOW 02/11/2024 1953   APPEARANCEUR CLEAR 02/11/2024 1953   LABSPEC 1.013 02/11/2024 1953   PHURINE 5.0 02/11/2024 1953   GLUCOSEU NEGATIVE 02/11/2024 1953   HGBUR NEGATIVE 02/11/2024 1953   BILIRUBINUR NEGATIVE 02/11/2024 1953   KETONESUR NEGATIVE 02/11/2024 1953   PROTEINUR NEGATIVE 02/11/2024 1953   NITRITE NEGATIVE 02/11/2024 1953   LEUKOCYTESUR NEGATIVE 02/11/2024 1953   Sepsis Labs: @LABRCNTIP (procalcitonin:4,lacticidven:4)  ) Recent Results (from the past 240 hours)  Resp panel by RT-PCR (RSV, Flu A&B, Covid) Anterior Nasal Swab     Status: None   Collection  Time: 02/11/24  6:35 PM   Specimen: Anterior Nasal Swab  Result Value Ref Range Status   SARS Coronavirus 2 by RT PCR NEGATIVE NEGATIVE Final    Comment: (NOTE) SARS-CoV-2 target nucleic acids are NOT DETECTED.  The SARS-CoV-2 RNA is generally detectable in upper respiratory specimens during the acute phase of infection. The lowest concentration of SARS-CoV-2 viral copies this assay can detect is 138 copies/mL. A negative result does not preclude SARS-Cov-2 infection and should not be used as the sole basis for treatment or other patient management decisions. A negative result may occur with  improper specimen collection/handling, submission of specimen other than nasopharyngeal swab, presence of viral mutation(s) within the areas targeted by this assay, and inadequate number of viral copies(<138 copies/mL). A negative result must be combined with clinical observations, patient history, and epidemiological information. The expected result is Negative.  Fact Sheet for  Patients:  BloggerCourse.com  Fact Sheet for Healthcare Providers:  SeriousBroker.it  This test is no t yet approved or cleared by the [place]  FDA and  has been authorized for detection and/or diagnosis of SARS-CoV-2 by FDA under an Emergency Use Authorization (EUA). This EUA will remain  in effect (meaning this test can be used) for the duration of the COVID-19 declaration under Section 564(b)(1) of the Act, 21 U.S.C.section 360bbb-3(b)(1), unless the authorization is terminated  or revoked sooner.       Influenza A by PCR NEGATIVE NEGATIVE Final   Influenza B by PCR NEGATIVE NEGATIVE Final    Comment: (NOTE) The Xpert Xpress SARS-CoV-2/FLU/RSV plus assay is intended as an aid in the diagnosis of influenza from Nasopharyngeal swab specimens and should not be used as a sole basis for treatment. Nasal washings and aspirates are unacceptable for Xpert Xpress SARS-CoV-2/FLU/RSV testing.  Fact Sheet for Patients: BloggerCourse.com  Fact Sheet for Healthcare Providers: SeriousBroker.it  This test is not yet approved or cleared by the [place]  FDA and has been authorized for detection and/or diagnosis of SARS-CoV-2 by FDA under an Emergency Use Authorization (EUA). This EUA will remain in effect (meaning this test can be used) for the duration of the COVID-19 declaration under Section 564(b)(1) of the Act, 21 U.S.C. section 360bbb-3(b)(1), unless the authorization is terminated or revoked.     Resp Syncytial Virus by PCR NEGATIVE NEGATIVE Final    Comment: (NOTE) Fact Sheet for Patients: BloggerCourse.com  Fact Sheet for Healthcare Providers: SeriousBroker.it  This test is not yet approved or cleared by the [place]  FDA and has been authorized for detection and/or diagnosis of SARS-CoV-2 by FDA under an Emergency  Use Authorization (EUA). This EUA will remain in effect (meaning this test can be used) for the duration of the COVID-19 declaration under Section 564(b)(1) of the Act, 21 U.S.C. section 360bbb-3(b)(1), unless the authorization is terminated or revoked.  Performed at [facility], [address] [place], [place] [address]          Radiology Studies: No results found.      Scheduled Meds:  amLODipine   2.5 mg Oral Daily   azithromycin   250 mg Oral Daily   budesonide -glycopyrrolate-formoterol  2 puff Inhalation BH-qamhs   diltiazem   120 mg Oral Daily   guaiFENesin   1,200 mg Oral BID   heparin  5,000 Units Subcutaneous Q8H   insulin aspart  0-5 Units Subcutaneous QHS   insulin aspart  0-9 Units Subcutaneous TID WC   latanoprost  1 drop Both Eyes QHS   predniSONE   40 mg  Oral Q breakfast   timolol  1 drop Both Eyes q morning   Continuous Infusions:   LOS: 0 days    Time spent: 55 minutes    Leatrice Chapel, MD  [facility] Pager #: [phone] 7PM-7AM contact night coverage as above

## 2024-02-13 NOTE — Plan of Care (Signed)
  Problem: Education: Goal: Knowledge of General Education information will improve Description: Including pain rating scale, medication(s)/side effects and non-pharmacologic comfort measures Outcome: Progressing   Problem: Health Behavior/Discharge Planning: Goal: Ability to manage health-related needs will improve Outcome: Progressing   Problem: Clinical Measurements: Goal: Ability to maintain clinical measurements within normal limits will improve Outcome: Progressing Goal: Will remain free from infection Outcome: Progressing Goal: Diagnostic test results will improve Outcome: Progressing Goal: Respiratory complications will improve Outcome: Progressing Goal: Cardiovascular complication will be avoided Outcome: Progressing   Problem: Activity: Goal: Risk for activity intolerance will decrease Outcome: Progressing   Problem: Nutrition: Goal: Adequate nutrition will be maintained Outcome: Progressing   Problem: Coping: Goal: Level of anxiety will decrease Outcome: Progressing   Problem: Elimination: Goal: Will not experience complications related to bowel motility Outcome: Progressing Goal: Will not experience complications related to urinary retention Outcome: Progressing   Problem: Pain Managment: Goal: General experience of comfort will improve and/or be controlled Outcome: Progressing   Problem: Safety: Goal: Ability to remain free from injury will improve Outcome: Progressing   Problem: Skin Integrity: Goal: Risk for impaired skin integrity will decrease Outcome: Progressing   Problem: Education: Goal: Knowledge of disease or condition will improve Outcome: Progressing Goal: Knowledge of the prescribed therapeutic regimen will improve Outcome: Progressing Goal: Individualized Educational Video(s) Outcome: Progressing   Problem: Activity: Goal: Ability to tolerate increased activity will improve Outcome: Progressing Goal: Will verbalize the  importance of balancing activity with adequate rest periods Outcome: Progressing   Problem: Respiratory: Goal: Ability to maintain a clear airway will improve Outcome: Progressing Goal: Levels of oxygenation will improve Outcome: Progressing Goal: Ability to maintain adequate ventilation will improve Outcome: Progressing   Problem: Education: Goal: Knowledge of disease or condition will improve Outcome: Progressing Goal: Knowledge of the prescribed therapeutic regimen will improve Outcome: Progressing Goal: Individualized Educational Video(s) Outcome: Progressing   Problem: Activity: Goal: Ability to tolerate increased activity will improve Outcome: Progressing Goal: Will verbalize the importance of balancing activity with adequate rest periods Outcome: Progressing   Problem: Respiratory: Goal: Ability to maintain a clear airway will improve Outcome: Progressing Goal: Levels of oxygenation will improve Outcome: Progressing Goal: Ability to maintain adequate ventilation will improve Outcome: Progressing   Problem: Education: Goal: Ability to describe self-care measures that may prevent or decrease complications (Diabetes Survival Skills Education) will improve Outcome: Progressing Goal: Individualized Educational Video(s) Outcome: Progressing   Problem: Coping: Goal: Ability to adjust to condition or change in health will improve Outcome: Progressing   Problem: Fluid Volume: Goal: Ability to maintain a balanced intake and output will improve Outcome: Progressing   Problem: Health Behavior/Discharge Planning: Goal: Ability to identify and utilize available resources and services will improve Outcome: Progressing Goal: Ability to manage health-related needs will improve Outcome: Progressing   Problem: Metabolic: Goal: Ability to maintain appropriate glucose levels will improve Outcome: Progressing   Problem: Nutritional: Goal: Maintenance of adequate nutrition  will improve Outcome: Progressing Goal: Progress toward achieving an optimal weight will improve Outcome: Progressing   Problem: Skin Integrity: Goal: Risk for impaired skin integrity will decrease Outcome: Progressing   Problem: Tissue Perfusion: Goal: Adequacy of tissue perfusion will improve Outcome: Progressing

## 2024-02-14 DIAGNOSIS — N179 Acute kidney failure, unspecified: Secondary | ICD-10-CM | POA: Diagnosis not present

## 2024-02-14 LAB — RENAL FUNCTION PANEL
Albumin: 3.1 g/dL — ABNORMAL LOW (ref 3.5–5.0)
Anion gap: 12 (ref 5–15)
BUN: 28 mg/dL — ABNORMAL HIGH (ref 8–23)
CO2: 19 mmol/L — ABNORMAL LOW (ref 22–32)
Calcium: 8.7 mg/dL — ABNORMAL LOW (ref 8.9–10.3)
Chloride: 108 mmol/L (ref 98–111)
Creatinine, Ser: 1.56 mg/dL — ABNORMAL HIGH (ref 0.61–1.24)
GFR, Estimated: 47 mL/min — ABNORMAL LOW (ref >60)
Glucose, Bld: 100 mg/dL — ABNORMAL HIGH (ref 70–99)
Phosphorus: 2.8 mg/dL (ref 2.5–4.6)
Potassium: 4.6 mmol/L (ref 3.5–5.1)
Sodium: 139 mmol/L (ref 135–145)

## 2024-02-14 LAB — CBC WITH DIFFERENTIAL/PLATELET
Abs Immature Granulocytes: 0.05 K/uL (ref 0.00–0.07)
Basophils Absolute: 0 K/uL (ref 0.0–0.1)
Basophils Relative: 0 %
Eosinophils Absolute: 0.1 K/uL (ref 0.0–0.5)
Eosinophils Relative: 1 %
HCT: 29.4 % — ABNORMAL LOW (ref 39.0–52.0)
Hemoglobin: 9.7 g/dL — ABNORMAL LOW (ref 13.0–17.0)
Immature Granulocytes: 1 %
Lymphocytes Relative: 16 %
Lymphs Abs: 1.8 K/uL (ref 0.7–4.0)
MCH: 32.1 pg (ref 26.0–34.0)
MCHC: 33 g/dL (ref 30.0–36.0)
MCV: 97.4 fL (ref 80.0–100.0)
Monocytes Absolute: 1.2 K/uL — ABNORMAL HIGH (ref 0.1–1.0)
Monocytes Relative: 10 %
Neutro Abs: 8 K/uL — ABNORMAL HIGH (ref 1.7–7.7)
Neutrophils Relative %: 72 %
Platelets: 246 K/uL (ref 150–400)
RBC: 3.02 MIL/uL — ABNORMAL LOW (ref 4.22–5.81)
RDW: 11.9 % (ref 11.5–15.5)
WBC: 11.1 K/uL — ABNORMAL HIGH (ref 4.0–10.5)
nRBC: 0 % (ref 0.0–0.2)

## 2024-02-14 LAB — GLUCOSE, CAPILLARY
Glucose-Capillary: 135 mg/dL — ABNORMAL HIGH (ref 70–99)
Glucose-Capillary: 96 mg/dL (ref 70–99)

## 2024-02-14 LAB — MAGNESIUM: Magnesium: 1.8 mg/dL (ref 1.7–2.4)

## 2024-02-14 MED ORDER — AZITHROMYCIN 250 MG PO TABS: 250.0000 mg | ORAL_TABLET | Freq: Every day | ORAL | 0 refills | Status: AC

## 2024-02-14 MED ORDER — PREDNISONE 10 MG PO TABS: ORAL_TABLET | ORAL | 0 refills | Status: DC

## 2024-02-14 NOTE — Discharge Summary (Signed)
 Physician Discharge Summary  Patient ID: Kenneth Weber MRN: [id number] DOB/AGE: 09/12/1950 73 y.o.  Admit date: 02/11/2024 Discharge date: 02/14/2024  Admission Diagnoses:  Discharge Diagnoses:  Principal Problem:   Acute kidney injury Active Problems:   Hypertension   COPD with acute exacerbation (HCC)   Hyperkalemia   Dyslipidemia   Lung nodule seen on imaging study   Discharged Condition: stable  Hospital Course: Patient is a 73 year old male with past medical history significant for dyslipidemia, hypertension, atrial fibrillation, COPD, and persistent iron deficiency anemia secondary to chronic blood loss.  Patient was admitted with URI/viral syndrome, dyspnea and worsening renal function.  Respiratory symptoms have improved significantly.  Dyspnea has resolved.  Renal function has improved significantly.  Serum creatinine on presentation was 2.59.  Serum creatinine prior to discharge 1.56.  Primary care provider will continue to monitor renal function and electrolytes.  Patient has been optimized.  Patient will be discharged back to the care of the primary care provider.  Acute Kidney Injury on chronic kidney disease stage IIIa/2: - Serum creatinine of 2.59 on presentation. - Prior to discharge, serum creatinine was down to 1.56. - Continue to avoid nephrotoxins. - Patient has been adequately hydrated. - Continue to optimize blood pressure.  -Continue to monitor renal function and electrolytes.   Hyperkalemia: - Potassium of 5.4 on presentation.   - Potassium has improved to 4.7. - Continue to monitor potassium level.  - Renal diet.   COPD exacerbation, mild: - Resolved significantly. - Tapering dose of prednisone  on discharge. - Complete course of Z-Pak.    - Continue bronchodilators.   - Continue home Breztri    Hyperglycemia: In setting of Prednisone  use - A1c was 5.4%.     5 x 6 mm subpleural left lower lobe pulmonary nodule - Outpatient follow up,  Non-contrast CT recommended in 6-12 months   Atrial fibrillation: - Continue home diltiazem  - Home Eliquis  discontinued by PCP prior to presentation due to anemia and concerns for blood loss.  Will defer to patient's primary care provider   Hypertension: - Continue home diltiazem  and amlodipine  - Held home olmesartan  secondary to AKI   Dyslipidemia    Consults: None  Significant Diagnostic Studies:    Discharge Exam: Blood pressure 137/73, pulse 71, temperature 97.7 F (36.5 C), temperature source Oral, resp. rate 19, height 6' (1.829 m), weight 85.2 kg, SpO2 97%.   Disposition: Discharge disposition: 01-Home or Self Care       Discharge Instructions     Diet - low sodium heart healthy   Complete by: As directed    Increase activity slowly   Complete by: As directed       Allergies as of 02/14/2024       Reactions   Streptogramins    Codeine Nausea And Vomiting   Hyzaar [losartan Potassium-hctz] Other (See Comments)   ED   Zestril [lisinopril] Cough        Medication List     STOP taking these medications    doxycycline 100 MG capsule Commonly known as: VIBRAMYCIN   doxylamine  (Sleep) 25 MG tablet Commonly known as: UNISOM    ELDERBERRY PO   Eliquis  5 MG Tabs tablet Generic drug: apixaban    FISH OIL PO   olmesartan  40 MG tablet Commonly known as: BENICAR    VITAMIN C PO       TAKE these medications    acetaminophen  500 MG tablet Commonly known as: TYLENOL  Take 1,000 mg by mouth 2 (two) times daily as needed for  headache.   albuterol  108 (90 Base) MCG/ACT inhaler Commonly known as: VENTOLIN  HFA Up to 2 puffs every 4 hours as needed   amLODipine  2.5 MG tablet Commonly known as: NORVASC  Take 1 tablet by mouth once daily   atorvastatin  20 MG tablet Commonly known as: LIPITOR TAKE 1 TABLET BY MOUTH AT BEDTIME   azithromycin  250 MG tablet Commonly known as: ZITHROMAX  Take 1 tablet (250 mg total) by mouth daily for 3 days. Start  taking on: February 15, 2024   Breztri  Aerosphere 160-9-4.8 MCG/ACT Aero inhaler Generic drug: budesonide -glycopyrrolate-formoterol Take 2 puffs first thing in am and then another 2 puffs about 12 hours later.   diltiazem  120 MG 24 hr capsule Commonly known as: CARDIZEM  CD Take 1 capsule (120 mg total) by mouth daily. PATIENT MUST ATTEND UPCOMING APPOINTMENT FOR FURTHER REFILLS   latanoprost 0.005 % ophthalmic solution Commonly known as: XALATAN Place 1 drop into both eyes at bedtime.   predniSONE  10 MG tablet Commonly known as: DELTASONE  Prednisone  40 mg p.o. once daily for 3 days, then 30 mg p.o. 1 daily for 3 days, then 20 mg p.o. once daily for 3 days, then 10 mg p.o. once daily for 3 days and stop. What changed:  medication strength how much to take how to take this when to take this additional instructions        Time spent: 35 minutes.  SignedBETHA Leatrice LILLETTE Rosario 02/14/2024, 3:12 PM

## 2024-02-14 NOTE — Progress Notes (Signed)
 Patient has received his discharge papers and has left the floor. Patient and spouse stated they took all home medications when admitted. Patient has no further questions or concerns.

## 2024-02-14 NOTE — Plan of Care (Signed)
   Problem: Health Behavior/Discharge Planning: Goal: Ability to manage health-related needs will improve Outcome: Progressing   Problem: Clinical Measurements: Goal: Will remain free from infection Outcome: Progressing   Problem: Coping: Goal: Level of anxiety will decrease Outcome: Progressing

## 2024-02-15 DIAGNOSIS — N179 Acute kidney failure, unspecified: Secondary | ICD-10-CM | POA: Diagnosis not present

## 2024-02-16 NOTE — Progress Notes (Unsigned)
 Cardiology Office Note:  .   Date:  02/18/2024  ID:  Kenneth Weber, DOB 18-Mar-1951, MRN [id number] PCP: Seabron Lenis, MD  [facility] Providers Cardiologist:  Darryle ONEIDA Decent, MD {   History of Present Illness: .    Chief Complaint  Patient presents with   Follow-up    Kenneth Weber is a 73 y.o. male with history of COPD, pAF, coronary calcium  who presents for follow-up.   History of Present Illness   Kenneth Weber is a 73 year old male with paroxysmal atrial fibrillation and severe COPD who presents for follow-up. He is accompanied by his wife, [name].  He was diagnosed with paroxysmal atrial fibrillation in December 2023 during a COPD exacerbation. Since then, he has had no recurrence of atrial fibrillation, and a monitor showed less than one percent AFib burden. An EKG from October 10th, 2025, showed normal sinus rhythm. No rapid heartbeats have been noted recently, although his heart rate dropped to 49 one night. He has been off Eliquis  for over a month and a half due to recurrent anemia.  He has severe COPD and recently experienced an exacerbation, leading to hospitalization for an upper respiratory infection. He experiences ongoing congestion and wheezing, particularly at night, despite being on a steroid and an antibiotic, which were changed during treatment. He uses a rescue inhaler sparingly, about once a month, and takes Breztri  twice daily. He sees a pulmonologist annually.  He has a history of recurrent anemia and was recently hospitalized due to low kidney function numbers. He has had two colonoscopies in the last two years, which were clear except for small hemorrhoids. He reports low blood counts for the last three years.  He experiences muscle aches with Lipitor and has coronary calcifications and hyperlipidemia. His cholesterol medication was stopped during a recent hospital stay, and he reports that his body felt better off the medication.  He has been  experiencing sleep disturbances and was taken off Unison during his hospital stay. He reports not sleeping for three days. He was also taken off supplements like elderberry, fish oil, and vitamin C.          Problem List Paroxysmal Afib -Dx 04/28/2022 -> RSV/COPD exacerbation  -CHADSVASC=2 (age, HTN) -<1% burden  2. HTN 3. COPD -severe  4. Esophageal candidiasis 04/30/2022 5. Coronary calcifications  6. HLD -T chol 119, LDL 43, HDL 60, TG 76    ROS: All other ROS reviewed and negative. Pertinent positives noted in the HPI.     Studies Reviewed: SABRA       TTE 04/26/2022  1. EF challenging in the setting of atrial fibrillation . Left  ventricular ejection fraction, by estimation, is 50 to 55%. The left  ventricle has low normal function. The left ventricle demonstrates global  hypokinesis. Indeterminate diastolic filling  due to E-A fusion.   2. Right ventricular systolic function is normal. The right ventricular  size is normal. Tricuspid regurgitation signal is inadequate for assessing  PA pressure.   3. Left atrial size was mildly dilated.   4. No evidence of mitral valve regurgitation.   5. The aortic valve was not well visualized. Aortic valve regurgitation  is not visualized.   6. The inferior vena cava is normal in size with greater than 50%  respiratory variability, suggesting right atrial pressure of 3 mmHg.  Physical Exam:   VS:  BP 126/60   Pulse 95   Ht 6' 1 (1.854 m)   Wt 186 lb (84.4  kg)   SpO2 96%   BMI 24.54 kg/m    Wt Readings from Last 3 Encounters:  02/18/24 186 lb (84.4 kg)  02/11/24 187 lb 13.3 oz (85.2 kg)  01/27/24 193 lb 5.5 oz (87.7 kg)    GEN: Well nourished, well developed in no acute distress NECK: No JVD; No carotid bruits CARDIAC: RRR, no murmurs, rubs, gallops RESPIRATORY:  Clear to auscultation without rales, wheezing or rhonchi  ABDOMEN: Soft, non-tender, non-distended EXTREMITIES:  No edema; No deformity  ASSESSMENT AND PLAN: .    Assessment and Plan    Paroxysmal Atrial Fibrillation No recurrence with less than one percent AFib burden. EKG shows normal sinus rhythm. Stroke risk discussed, Watchman procedure presented as alternative to long-term anticoagulation. - Continue diltiazem  120 mg extended release daily. - Stop Eliquis . - Consider Watchman procedure for stroke prevention. - Monitor heart rate and adjust medications if necessary.  Severe COPD Persistent congestion and wheezing at night following recent upper respiratory infection and hospitalization. - Continue prednisone  as prescribed. - Continue antibiotics as prescribed. - Use rescue inhaler as needed. - Contact pulmonologist if symptoms worsen.   Hypertension Blood pressure well-controlled with amlodipine . Olmesartan  stopped due to renal function concerns. - Continue amlodipine  2.5 mg daily. - Stop olmesartan .  Hyperlipidemia Interested in alternative lipid-lowering therapies due to muscle aches from Lipitor. - Refer to pharmacist for evaluation of PCSK9 inhibitor as alternative to statin therapy. - Stop Lipitor.  Anemia Low blood counts for three years. Advised to stay off Eliquis  due to anemia and bleeding risk. - Follow up with primary care physician for further evaluation of anemia.              Follow-up: Return in about 6 months (around 08/18/2024).   Signed, Darryle DASEN. Barbaraann, MD, [facility]  [facility]  [address] [place], [place] [address] [phone]  4:29 PM

## 2024-02-18 ENCOUNTER — Encounter: Payer: Self-pay | Admitting: Cardiovascular Disease

## 2024-02-18 ENCOUNTER — Ambulatory Visit: Attending: Cardiovascular Disease | Admitting: Cardiovascular Disease

## 2024-02-18 VITALS — BP 126/60 | HR 95 | Ht 73.0 in | Wt 186.0 lb

## 2024-02-18 DIAGNOSIS — I10 Essential (primary) hypertension: Secondary | ICD-10-CM | POA: Diagnosis not present

## 2024-02-18 DIAGNOSIS — I2510 Atherosclerotic heart disease of native coronary artery without angina pectoris: Secondary | ICD-10-CM | POA: Diagnosis not present

## 2024-02-18 DIAGNOSIS — I480 Paroxysmal atrial fibrillation: Secondary | ICD-10-CM

## 2024-02-18 DIAGNOSIS — E785 Hyperlipidemia, unspecified: Secondary | ICD-10-CM | POA: Diagnosis not present

## 2024-02-18 NOTE — Patient Instructions (Signed)
 Medication Instructions:  Your physician has recommended you make the following change in your medication:  STOP: atorvastatin  (Lipitor)  *If you need a refill on your cardiac medications before your next appointment, please call your pharmacy*  Lab Work: NONE  If you have labs (blood work) drawn today and your tests are completely normal, you will receive your results only by: MyChart Message (if you have MyChart) OR A paper copy in the mail If you have any lab test that is abnormal or we need to change your treatment, we will call you to review the results.  Testing/Procedures: Your physician has referred you to the Pharmacy Clinic for Cholesterol management.   Follow-Up: At [facility], you and your health needs are our priority.  As part of our continuing mission to provide you with exceptional heart care, our providers are all part of one team.  This team includes your primary Cardiologist (physician) and Advanced Practice Providers or APPs (Physician Assistants and Nurse Practitioners) who all work together to provide you with the care you need, when you need it.  Your next appointment:   6 month(s)  Provider:   Darryle ONEIDA Decent, MD

## 2024-03-05 ENCOUNTER — Other Ambulatory Visit: Payer: Self-pay | Admitting: Cardiovascular Disease

## 2024-03-10 ENCOUNTER — Ambulatory Visit
Admission: RE | Admit: 2024-03-10 | Discharge: 2024-03-10 | Disposition: A | Discharge status: Home / Self Care | Source: Ambulatory Visit | Attending: Acute Care | Admitting: Acute Care

## 2024-03-10 DIAGNOSIS — Z122 Encounter for screening for malignant neoplasm of respiratory organs: Secondary | ICD-10-CM

## 2024-03-10 DIAGNOSIS — Z87891 Personal history of nicotine dependence: Secondary | ICD-10-CM

## 2024-03-17 ENCOUNTER — Telehealth: Payer: Self-pay | Admitting: Acute Care

## 2024-03-17 NOTE — Telephone Encounter (Signed)
 LR 2 scan , 12 month annual follow up.  Ask if he has been sick. If he has he needs to follow up with PCP for the bronchopneumonia.  Aortic atherosclerosis, CAD, no statin that I can see.  Emphysema. Please fax results to PCP, and schedule annual scan.  Thanks so much

## 2024-03-18 ENCOUNTER — Other Ambulatory Visit: Payer: Self-pay

## 2024-03-18 DIAGNOSIS — Z122 Encounter for screening for malignant neoplasm of respiratory organs: Secondary | ICD-10-CM

## 2024-03-18 DIAGNOSIS — Z87891 Personal history of nicotine dependence: Secondary | ICD-10-CM

## 2024-03-18 NOTE — Telephone Encounter (Signed)
 Called patient to review recent Lung CT results. No answer, LVM to call the office.

## 2024-03-18 NOTE — Telephone Encounter (Addendum)
 Spoke with patient. Reviewed recent lung CT results. He will complete an annual Lung CT again next year. Per patient he was recently hospitalized and treated for pneumonia twice and is still being followed by his PCP. He has been unable to tolerate a stain, they are working to get him on Repatha for his cholesterol  Results and plan to PCP.

## 2024-03-26 NOTE — Progress Notes (Unsigned)
 Patient ID: Kenneth Weber                 DOB: [birth year]                    MRN: [id number]    HPI: Kenneth Weber is a 73 y.o. male patient referred to lipid clinic by  Dr. Barbaraann. PMH is significant for COPD, AF, HTN, and coronary calcium . Patient has a hx of recurrent anemia and was recently hospitalized due to low kidney function. During a recent hospital stay, atorvastatin  was stopped due to muscle aches. He reports that he feels much better off of the medication. Patient has not tried any other statins and was at goal on atorvastatin  20 mg (LDL 43). Most recent LDL after stopping atorvastatin  on 02/18/2024 was 61.   Patient presents to PharmD today to discuss lipid lowering medications. The patient states that he had severe muscle and joint pain on atorvastatin  20 mg. This is the only statin that the patient has tried. He says that his wife uses Repatha and he would like to try that. The patient was educated on the benefits of statin use and that statins are a class of medications. We explained that some statins cause more muscle pain than others. He was willing to discuss trying another statin. The patient stated that he has lost his voice and has trouble speaking loudly. He is going to bring this up with his pulmonologist on his visit on 04/26/2024. He and his wife eat a heart healthy diet. 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   Current Medications: none Intolerances: atorvastatin  20 mg (myalgias) Risk Factors: HTN, elevated CAC LDL-C goal: < 70   Diet:  Breakfast: usually does not eat Lunch: Salad, sandwich (turkey on low carb bread) Dinner: Chicken (baked), Pork chops, corn, beans, okra, spinach, asparagus Snacks: Peanut butter with celery  Drinks water  Exercise:  Walks 10,000 steps a day (in a warehouse) Goes to the gym for [employer] training every other day.   Family History:  Mother: HTN Father: CAD, HTN  Social History:   Former smoker Does not drink   Labs: Lipid Panel  No results found for: CHOL, TRIG, HDL, CHOLHDL, VLDL, LDLCALC, LDLDIRECT, LABVLDL  Past Medical History:  Diagnosis Date   Allergic rhinitis    Anemia    Arthritis    Chest pain    COPD (chronic obstructive pulmonary disease) (HCC)    ED (erectile dysfunction)    ED (erectile dysfunction) 06/19/2022   Hyperlipidemia    Hypertension    Pneumonia    SOB (shortness of breath)     Current Outpatient Medications on File Prior to Visit  Medication Sig Dispense Refill   acetaminophen  (TYLENOL ) 500 MG tablet Take 1,000 mg by mouth 2 (two) times daily as needed for headache.     albuterol  (VENTOLIN  HFA) 108 (90 Base) MCG/ACT inhaler Up to 2 puffs every 4 hours as needed 1 each 11   amLODipine  (NORVASC ) 2.5 MG tablet Take 1 tablet (2.5 mg total) by mouth daily. 90 tablet 3   ascorbic acid (VITAMIN C) 500 MG tablet Take 500 mg by mouth daily.     Budeson-Glycopyrrol-Formoterol  (BREZTRI  AEROSPHERE) 160-9-4.8 MCG/ACT AERO Take 2 puffs first thing in am and then another 2 puffs about 12 hours later. 10.7 g 11   diltiazem  (CARDIZEM  CD) 120 MG 24 hr capsule Take 1 capsule (120 mg total)  by mouth daily. PATIENT MUST ATTEND UPCOMING APPOINTMENT FOR FURTHER REFILLS 90 capsule 0   latanoprost  (XALATAN ) 0.005 % ophthalmic solution Place 1 drop into both eyes at bedtime.     No current facility-administered medications on file prior to visit.    Allergies  Allergen Reactions   Streptogramins    Codeine Nausea And Vomiting   Hyzaar [Losartan Potassium-Hctz] Other (See Comments)    ED   Zestril [Lisinopril] Cough    Assessment/Plan:  1. Hyperlipidemia -  Dyslipidemia Assessment- Uncontrolled  -Medication therapy is needed after atorvastatin  was stopped during his hospital visit due to muscle pains.  -The only statin the patient has tried is atorvastatin  -The patient eats a heart healthy diet with his wife, works in a  warehouse, and goes to [employer] every other day for [employer] training.   Plan:  -Start rosuvastatin  5 mg daily.  -Continue diet and exercises routine.   Follow-up:  -Lipid panel in February  -Send a clinical cytogeneticist message or call with any concerns  Thank you, Lum Ricks, PharmD Candidate  [employer] [facility]    Eleanor JONETTA Crews, Pharm.JONETTA SARAN, CPP [facility] [address] [place], [place] [address]  Phone: [phone]; Fax: [phone]

## 2024-03-29 ENCOUNTER — Ambulatory Visit: Attending: Cardiology | Admitting: Pharmacist

## 2024-03-29 DIAGNOSIS — E785 Hyperlipidemia, unspecified: Secondary | ICD-10-CM

## 2024-03-29 MED ORDER — ROSUVASTATIN CALCIUM 5 MG PO TABS: 5.0000 mg | ORAL_TABLET | Freq: Every day | ORAL | 3 refills | Status: AC

## 2024-03-29 NOTE — Assessment & Plan Note (Addendum)
 Assessment- Uncontrolled  -Medication therapy is needed after atorvastatin  was stopped during his hospital visit due to muscle pains.  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   Plan:  -Start rosuvastatin  5 mg daily.  -Continue diet and exercises routine.   Follow-up:  -Lipid panel in February  -Send a mychart message or call with any concerns

## 2024-03-29 NOTE — Patient Instructions (Signed)
 Thank you for coming to see us  today.   Start rosuvastatin  5 mg daily.  Labs in 3 months (towards the end of February).   Call or send us  a message if you have any issues.

## 2024-04-06 ENCOUNTER — Other Ambulatory Visit (HOSPITAL_BASED_OUTPATIENT_CLINIC_OR_DEPARTMENT_OTHER): Payer: Self-pay | Admitting: Cardiovascular Disease

## 2024-04-14 ENCOUNTER — Other Ambulatory Visit: Payer: Self-pay | Admitting: Family

## 2024-04-14 ENCOUNTER — Telehealth: Payer: Self-pay | Admitting: Cardiovascular Disease

## 2024-04-14 NOTE — Telephone Encounter (Signed)
 Patient identification verified by 2 forms.   Called and spoke to patient  Patient states:  -Sleepy/tired -Noticed increase in blood pressure since medication changes. (Not sure if related or not).  -Pt's wife states when pt seen Melissa she states I don't know why you are on this medication - Crestor  your numbers are good  -Pt's wife is concerned he is taking Crestor  needlessly.   Patient denies:  -Any other symptoms   Interventions/Plan: -Will get message to Dr. Barbaraann to advise.   Reviewed ED warning signs/precautions  Patient agrees with plan, no questions at this time

## 2024-04-14 NOTE — Telephone Encounter (Signed)
 Pt c/o BP issue: STAT if pt c/o blurred vision, one-sided weakness or slurred speech.  STAT if BP is GREATER than 180/120 TODAY.  STAT if BP is LESS than 90/60 and SYMPTOMATIC TODAY  1. What is your BP concern? Hypertension   2. Have you taken any BP medication today?yes   3. What are your last 5 BP readings?159/81 HR 81, 152/90 HR 60, 145/81 HR 75, 146/81 HR 73, 141/82 HR 74, 141/82 HR 74, 138/79 HR 73   4. Are you having any other symptoms (ex. Dizziness, headache, blurred vision, passed out)? Tired    Please advise

## 2024-04-16 MED ORDER — AMLODIPINE BESYLATE 5 MG PO TABS: 5.0000 mg | ORAL_TABLET | Freq: Every day | ORAL | 3 refills | Status: DC

## 2024-04-16 NOTE — Telephone Encounter (Signed)
 Spoke with patient and his spouse and shared message from Dr. Barbaraann and pharmacist.   New Rx for amlodipine  5 mg sent to [facility] pharmacy. Patient and spouse verbalized understanding of recommendations and explanation from Dr. Barbaraann and pharmacist.

## 2024-04-16 NOTE — Telephone Encounter (Signed)
 Patient is returning call.

## 2024-04-16 NOTE — Addendum Note (Signed)
 Addended by: VICCI ROXIE CROME on: 04/16/2024 04:04 PM   Modules accepted: Orders

## 2024-04-16 NOTE — Telephone Encounter (Signed)
 Left message for patient to call back

## 2024-04-22 ENCOUNTER — Telehealth: Payer: Self-pay | Admitting: Cardiovascular Disease

## 2024-04-22 NOTE — Telephone Encounter (Signed)
 Spoke with pt regarding his blood pressure and joint aching. Pt provided blood pressure readings in the message below. Pt stated that his blood pressures have been elevated even since increasing the dose of his Amlodipine  in November. Pt stated he has been feeling a little lightheaded and his vision is slightly blurred. Pt also stated that since starting Crestor  he has had terrible body aches sometimes making it difficult to get out of bed. Pt was told to hold Crestor  for two weeks and let us  know if the pain subsides. For blood pressure, pt was give ED precautions and told that Dr. Barbaraann would be notified of his readings. Pt verbalized understanding. All questions if any were answered.

## 2024-04-22 NOTE — Telephone Encounter (Signed)
 Pt c/o BP issue: STAT if pt c/o blurred vision, one-sided weakness or slurred speech.  STAT if BP is GREATER than 180/120 TODAY.  STAT if BP is LESS than 90/60 and SYMPTOMATIC TODAY  1. What is your BP concern?   See below  2. Have you taken any BP medication today?  Yes  3. What are your last 5 BP readings?  158/79  HR 56 168/86  HR 77 151/82  HR 66 163/86  HR 80 149/81  HR 66  4. Are you having any other symptoms (ex. Dizziness, headache, blurred vision, passed out)?   Lightheaded, sight affected  Patient stated his BP medication was increased but he is still having high BP readings.

## 2024-04-23 MED ORDER — HYDRALAZINE HCL 50 MG PO TABS: 50.0000 mg | ORAL_TABLET | Freq: Three times a day (TID) | ORAL | 3 refills | Status: DC

## 2024-04-23 NOTE — Telephone Encounter (Signed)
 Called and spoke to pt. Gave him Dr. Dineen rec's. He will Stop Crestor . Agrees to start Hydralazine  50 mg TID. RX sent to preferred pharmacy. Will route to Maccia, Pharmacist also.

## 2024-04-26 ENCOUNTER — Ambulatory Visit: Admitting: Internal Medicine

## 2024-04-26 ENCOUNTER — Encounter: Payer: Self-pay | Admitting: Internal Medicine

## 2024-04-26 ENCOUNTER — Telehealth: Payer: Self-pay | Admitting: Internal Medicine

## 2024-04-26 VITALS — BP 144/60 | HR 85 | Ht 73.0 in | Wt 196.0 lb

## 2024-04-26 DIAGNOSIS — R490 Dysphonia: Secondary | ICD-10-CM | POA: Diagnosis not present

## 2024-04-26 DIAGNOSIS — Z87891 Personal history of nicotine dependence: Secondary | ICD-10-CM | POA: Diagnosis not present

## 2024-04-26 DIAGNOSIS — J449 Chronic obstructive pulmonary disease, unspecified: Secondary | ICD-10-CM | POA: Diagnosis not present

## 2024-04-26 MED ORDER — ALBUTEROL SULFATE HFA 108 (90 BASE) MCG/ACT IN AERS: INHALATION_SPRAY | RESPIRATORY_TRACT | 11 refills | Status: AC

## 2024-04-26 MED ORDER — STIOLTO RESPIMAT 2.5-2.5 MCG/ACT IN AERS: 2.0000 | INHALATION_SPRAY | Freq: Every day | RESPIRATORY_TRACT | 11 refills | Status: AC

## 2024-04-26 NOTE — Telephone Encounter (Signed)
 Patient dropped off AZ&Me form and it is placed in Dr.Wert's box.

## 2024-04-26 NOTE — Assessment & Plan Note (Addendum)
 Onset fall 2025 on breztri / excess mint intake - Referred to  ENT 04/26/2024 = ppi/ h2 and diet -  see avs for instructions unique to this ov  - trial of max otc GERD rx awaiting ENT eval

## 2024-04-26 NOTE — Assessment & Plan Note (Addendum)
 Quit smoking 2015/MM - flared p covid 19 jan 22  - 10/10/2020  After extensive coaching inhaler device,  effectiveness =    90% > trial of breztri  2bid and prn saba - PFT's  12/01/2020  FEV1 1.68 (47 % ) ratio 0.58  p 28 % improvement from saba p 0 prior to study with DLCO  19.33 (69%) corrects to 3.52 (86%)  for alv volume and FV curve classically concave   - LDSCT  12/202/22  Emphysema  -  12/10/2021  :    alpha one AT phenotype  MM level 135  - 04/26/2024  After extensive coaching inhaler device,  effectiveness =  SMI 90% try stiolto 2 puff each am (hoarsness and h/o pna on breztri )   No appears more c/w Pt is Group B in terms of symptom/risk and laba/lama therefore appropriate rx at this point >>>  stiolto 2 puff each am if insurance covers.  Discussed in detail all the  indications, usual  risks and alternatives  relative to the benefits with patient who agrees to proceed with Rx as outlined.

## 2024-04-26 NOTE — Progress Notes (Signed)
 "  Kenneth Weber, male    DOB: [birth year]   MRN: [id number]   Brief patient profile:  73 yow male quit smoking 09/2013/MM  with doe  Only sob uphill s need for inhalers vax x 2 then omicron Jan 2022 cough/sob and sick x 10 days other added x albuterol  ? Helped but not back to baseline ex tol more due to fatigue than sob referred to pulmonary clinic in [place]  10/10/2020 by Dr   Gib   History of Present Illness  10/10/2020  Pulmonary/ 1st office eval/Kenneth Weber  Chief Complaint  Patient presents with   Pulmonary Consult    Referred by Dr Lamar Gib. Pt states had covid 19 in Jan 2022-having SOB since then. He gets SOB when lifting things.   Dyspnea:  MMRC1 = can walk nl pace, flat grade, can't hurry or go uphills or steps s sob   Cough: none  Sleep: wheeze at night sometimes  SABA use: twice a week esp noct   Rec I would prefer you not use Timoptic  eyedrops and instead Betoptic  Plan A = Automatic = Always=   Breztri  Take 2 puffs first thing in am and then another 2 puffs about 12 hours later - ok to reduce to one twice daily  Work on inhaler technique: Plan B = Backup (to supplement plan A, not to replace it) Only use your albuterol  inhaler as rescue Please schedule a follow up office visit in 6 weeks, call sooner if needed with pfts on return     12/10/2021  :    alpha one AT phenotype  MM level 135    03/06/2023  f/u ov/Kenneth Weber re: GOLD 3 maint on Breztri  one bid  > no change symptoms vs 2 bid  Chief Complaint  Patient presents with   Follow-up    Bronchitis, COVID and pneumonia 3 weeks ago.  Rx steriods and abt.  Dyspnea:  MMRC1 = can walk nl pace, flat grade, can't hurry or go uphills or steps s sob  / walking with wife at 1.5 mile x 15-20 min  Cough: nne  Sleeping: flat bd 2 pillows under head  resp cc  SABA use: rarely  02: none  Lung cancer screening :  in program  Rec No change in medications  If having more daytime breathing problems then ok to take both breztri  puffs in am    LDSCT  03/10/24 Lung-RADS 2, benign appearance or behavior. Continue annual screening with low-dose chest CT without contrast in 12 months.   04/26/2024  yearly  f/u ov/Kenneth Weber re: GOLD 3 COPD    maint on Breztri  new cc hoarseess sev months  Chief Complaint  Patient presents with   Medical Management of Chronic Issues   COPD    Breathing has improved some since his last visit.   Dyspnea:  10k steps a day no climbing / does ok carrying objects now  Cough: minimal mucoid  Sleeping: flat surface  2 pillows under head resp cc - loud snoring SABA use: rarely  02: none   Lung cancer screening :  q November  - due 2026    No obvious day to day or daytime variability or assoc excess/ purulent sputum or mucus plugs or hemoptysis or cp or chest tightness, subjective wheeze or overt sinus or hb symptoms.    Also denies any obvious fluctuation of symptoms with weather or environmental changes or other aggravating or alleviating factors except as outlined above   No unusual exposure  hx or h/o childhood pna/ asthma or knowledge of premature birth.  Current Allergies, Complete Past Medical History, Past Surgical History, Family History, and Social History were reviewed in [employer] record.  ROS  The following are not active complaints unless bolded Hoarseness, sore throat, dysphagia, dental problems, itching, sneezing,  nasal congestion or discharge of excess mucus or purulent secretions, ear ache,   fever, chills, sweats, unintended wt loss or wt gain, classically pleuritic or exertional cp,  orthopnea pnd or arm/hand swelling  or leg swelling, presyncope, palpitations, abdominal pain, anorexia, nausea, vomiting, diarrhea  or change in bowel habits or change in bladder habits, change in stools or change in urine, dysuria, hematuria,  rash, arthralgias, visual complaints, headache, numbness, weakness or ataxia or problems with walking or coordination,  change in mood or   memory.          Outpatient Medications Prior to Visit  Medication Sig Dispense Refill   acetaminophen  (TYLENOL ) 500 MG tablet Take 1,000 mg by mouth 2 (two) times daily as needed for headache.     albuterol  (VENTOLIN  HFA) 108 (90 Base) MCG/ACT inhaler Up to 2 puffs every 4 hours as needed 1 each 11   amLODipine  (NORVASC ) 5 MG tablet Take 1 tablet (5 mg total) by mouth daily. 90 tablet 3   ascorbic acid (VITAMIN C) 500 MG tablet Take 500 mg by mouth daily.     Budeson-Glycopyrrol-Formoterol  (BREZTRI  AEROSPHERE) 160-9-4.8 MCG/ACT AERO Take 2 puffs first thing in am and then another 2 puffs about 12 hours later. 10.7 g 11   diltiazem  (CARDIZEM  CD) 120 MG 24 hr capsule TAKE 1 CAPSULE BY MOUTH ONCE DAILY . APPOINTMENT REQUIRED FOR FUTURE REFILLS 90 capsule 3   hydrALAZINE  (APRESOLINE ) 50 MG tablet Take 1 tablet (50 mg total) by mouth 3 (three) times daily. 270 tablet 3   latanoprost  (XALATAN ) 0.005 % ophthalmic solution Place 1 drop into both eyes at bedtime. (Patient not taking: Reported on 04/26/2024)     rosuvastatin  (CRESTOR ) 5 MG tablet Take 1 tablet (5 mg total) by mouth daily. (Patient not taking: Reported on 04/26/2024) 90 tablet 3   No facility-administered medications prior to visit.        Past Medical History:  Diagnosis Date   Allergic rhinitis    Chest pain    ED (erectile dysfunction)    Hyperlipidemia    Hypertension    SOB (shortness of breath)      Objective:    Wts  04/26/2024     196   03/06/2023    193   12/10/2021         188   12/01/20 183 lb (83 kg)  10/10/20 181 lb (82.1 kg)  12/08/13 206 lb (93.4 kg)   Vital signs reviewed  04/26/2024  - Note at rest 02 sats  97% on RA   General appearance:    amb variably hoarse wm nad   HEENT : Oropharynx  clear      NECK :  without  apparent JVD/ palpable Nodes/TM    LUNGS: no acc muscle use,  Mild barrel  contour chest wall with bilateral  Distant bs s audible wheeze and  without cough on insp or exp  maneuvers  and mild  Hyperresonant  to  percussion bilaterally     CV:  RRR  no s3 or murmur or increase in P2, and no edema   ABD:  soft and nontender   MS:  Nl gait/ ext  warm without deformities Or obvious joint restrictions  calf tenderness, cyanosis or clubbing     SKIN: warm and dry without lesions    NEURO:  alert, approp, nl sensorium with  no motor or cerebellar deficits apparent.     Assessment   Assessment & Plan COPD GOLD 3  Quit smoking 2015/MM - flared p covid 19 jan 22  - 10/10/2020  After extensive coaching inhaler device,  effectiveness =    90% > trial of breztri  2bid and prn saba - PFT's  12/01/2020  FEV1 1.68 (47 % ) ratio 0.58  p 28 % improvement from saba p 0 prior to study with DLCO  19.33 (69%) corrects to 3.52 (86%)  for alv volume and FV curve classically concave   - LDSCT  12/202/22  Emphysema  -  12/10/2021  :    alpha one AT phenotype  MM level 135  - 04/26/2024  After extensive coaching inhaler device,  effectiveness =  SMI 90% try stiolto 2 puff each am (hoarsness and h/o pna on breztri )   No appears more c/w Pt is Group B in terms of symptom/risk and laba/lama therefore appropriate rx at this point >>>  stiolto 2 puff each am if insurance covers.  Discussed in detail all the  indications, usual  risks and alternatives  relative to the benefits with patient who agrees to proceed with Rx as outlined.        Hoarseness Onset fall 2025 on breztri / excess mint intake - Referred to  ENT 04/26/2024 = ppi/ h2 and diet -  see avs for instructions unique to this ov  - trial of max otc GERD rx awaiting ENT eval    Former smoker Stopped smoking 2015 > started LDSCT 04/2021   Low-dose CT lung cancer screening is recommended for patients who are 2-36 years of age with a 20+ pack-year history of smoking and who are currently smoking or quit <=15 years ago. No coughing up blood  No unintentional weight loss of > 15 pounds in the last 6 months - pt is eligible  for scanning yearly until 2030  >>> continue with LDS program advised, last study reviewed with pt         Each maintenance medication was reviewed in detail including emphasizing most importantly the difference between maintenance and prns and under what circumstances the prns are to be triggered using an action plan format where appropriate.  Total time for H and P, chart review, counseling, reviewing hfa/ smi device(s) and generating customized AVS unique to this office visit / same day charting = 43 min f/u with new complaint evaluation and rx (hoarsenss)         AVS  Patient Instructions  Plan A = Automatic = Always=    Stiolto Take 2 puffs first thing in am and then another 2 puffs about 12 hours later.    Work on inhaler technique:  relax and gently blow all the way out then take a nice smooth full deep breath back in, triggering the inhaler at same time you start breathing in.  Hold breath in for at least  5 seconds if you can.      Plan B = Backup (to supplement plan A, not to replace it) Use your albuterol  inhaler as a rescue medication to be used if you can't catch your breath by resting or slowing your pace  or doing a relaxed purse lip breathing pattern.  - The less you use it,  the better it will work when you need it. - Ok to use the inhaler up to 2 puffs  every 4 hours if you must but call for appointment if use goes up over your usual need - Don't leave home without it !!  (think of it like the spare tire or starter fluid for your car)   Try prilosec otc 20mg   Take 30-60 min before first meal of the day and Pepcid ac (famotidine) 20 mg one @  bedtime x 2 weeks   GERD (REFLUX)  is an extremely common cause of respiratory symptoms just like yours , many times with no obvious heartburn at all.    It can be treated with medication, but also with lifestyle changes including elevation of the head of your bed (ideally with 6 -8inch blocks under the headboard of your bed),   Smoking cessation, avoidance of late meals, excessive alcohol, and avoid fatty foods, chocolate, peppermint, colas, red wine, and acidic juices such as orange juice.  NO MINT OR MENTHOL PRODUCTS SO NO COUGH DROPS   - Luden's cough drop USE SUGARLESS CANDY INSTEAD (Jolley ranchers or Stover's or Environmental Manager) or even ice chips will also do - the key is to swallow to prevent all throat clearing. NO OIL BASED VITAMINS - use powdered substitutes.  Avoid fish oil when coughing.       My office will be contacting you by phone for referral to ENT re hoarseness   - if you don't hear back from my office within one week please call us  back or notify us  thru MyChart and we'll address it right away.    Please schedule a follow up visit in 3 months but call sooner if needed    Ozell America, MD 04/26/2024   "

## 2024-04-26 NOTE — Assessment & Plan Note (Addendum)
 Stopped smoking 2015 > started LDSCT 04/2021   Low-dose CT lung cancer screening is recommended for patients who are 72-72 years of age with a 20+ pack-year history of smoking and who are currently smoking or quit <=15 years ago. No coughing up blood  No unintentional weight loss of > 15 pounds in the last 6 months - pt is eligible for scanning yearly until 2030  >>> continue with LDS program advised, last study reviewed with pt         Each maintenance medication was reviewed in detail including emphasizing most importantly the difference between maintenance and prns and under what circumstances the prns are to be triggered using an action plan format where appropriate.  Total time for H and P, chart review, counseling, reviewing hfa/ smi device(s) and generating customized AVS unique to this office visit / same day charting = 43 min f/u with new complaint evaluation and rx (hoarsenss)

## 2024-04-26 NOTE — Patient Instructions (Addendum)
 Plan A = Automatic = Always=    Stiolto Take 2 puffs first thing in am and then another 2 puffs about 12 hours later.    Work on inhaler technique:  relax and gently blow all the way out then take a nice smooth full deep breath back in, triggering the inhaler at same time you start breathing in.  Hold breath in for at least  5 seconds if you can.      Plan B = Backup (to supplement plan A, not to replace it) Use your albuterol  inhaler as a rescue medication to be used if you can't catch your breath by resting or slowing your pace  or doing a relaxed purse lip breathing pattern.  - The less you use it, the better it will work when you need it. - Ok to use the inhaler up to 2 puffs  every 4 hours if you must but call for appointment if use goes up over your usual need - Don't leave home without it !!  (think of it like the spare tire or starter fluid for your car)   Try prilosec otc 20mg   Take 30-60 min before first meal of the day and Pepcid ac (famotidine) 20 mg one @  bedtime x 2 weeks   GERD (REFLUX)  is an extremely common cause of respiratory symptoms just like yours , many times with no obvious heartburn at all.    It can be treated with medication, but also with lifestyle changes including elevation of the head of your bed (ideally with 6 -8inch blocks under the headboard of your bed),  Smoking cessation, avoidance of late meals, excessive alcohol, and avoid fatty foods, chocolate, peppermint, colas, red wine, and acidic juices such as orange juice.  NO MINT OR MENTHOL PRODUCTS SO NO COUGH DROPS   - Luden's cough drop USE SUGARLESS CANDY INSTEAD (Jolley ranchers or Stover's or Environmental Manager) or even ice chips will also do - the key is to swallow to prevent all throat clearing. NO OIL BASED VITAMINS - use powdered substitutes.  Avoid fish oil when coughing.       My office will be contacting you by phone for referral to ENT re hoarseness   - if you don't hear back from my office within one  week please call us  back or notify us  thru MyChart and we'll address it right away.    Please schedule a follow up visit in 3 months but call sooner if needed

## 2024-04-27 ENCOUNTER — Telehealth: Payer: Self-pay | Admitting: Cardiovascular Disease

## 2024-04-27 ENCOUNTER — Encounter (INDEPENDENT_AMBULATORY_CARE_PROVIDER_SITE_OTHER): Payer: Self-pay

## 2024-04-27 MED ORDER — HYDRALAZINE HCL 25 MG PO TABS: 25.0000 mg | ORAL_TABLET | Freq: Three times a day (TID) | ORAL | 3 refills | Status: AC

## 2024-04-27 NOTE — Telephone Encounter (Signed)
 Spoke with pt regarding Kenneth Weber's suggestions. Pt agreeable to plan. New prescription sent. Pt verbalized understanding. All questions if any were answered.

## 2024-04-27 NOTE — Telephone Encounter (Signed)
 Pt c/o medication issue:  1. Name of Medication:   hydrALAZINE  (APRESOLINE ) 50 MG tablet    2. How are you currently taking this medication (dosage and times per day)? As written   3. Are you having a reaction (difficulty breathing--STAT)? No   4. What is your medication issue? Pt spouse called in stating pt has had a headache since starting this med. Please advise.

## 2024-04-27 NOTE — Telephone Encounter (Signed)
 Spoke to patient's wife and was advised to contact patient. She had expressed that patient having severe headaches. Spoke to patient, reports severe headaches for the last three days. Symptoms started since starting use of hydralazine . Pt is drinking around 6 to 7 bottles of water a day, denies numbness/ tingling, weakness in arms or legs, and speech changes. Pt has noticed blurred vision. Pt has been taking tylenol  with no relief.   Most recent blood pressures/ heart rates since starting hydralazine : 135/82 and 74 156/84 and 68  158/86

## 2024-04-27 NOTE — Telephone Encounter (Signed)
 At ov on 04/26/24 pt was taken off Breztri  and placed on Stiolto. No need for the [place] and Me forms since med changed. Pt aware and will close this encounter.

## 2024-04-30 NOTE — Telephone Encounter (Signed)
 I will call patient after his 2 week holiday and see if muscle pains improved and discuss Repatha.

## 2024-05-10 ENCOUNTER — Telehealth: Payer: Self-pay | Admitting: Pharmacist

## 2024-05-10 DIAGNOSIS — E785 Hyperlipidemia, unspecified: Secondary | ICD-10-CM

## 2024-05-10 NOTE — Telephone Encounter (Signed)
" °  Pt c/o medication issue:  1. Name of Medication: rosuvastatin  (CRESTOR ) 5 MG tablet   2. How are you currently taking this medication (dosage and times per day)? Take 1 tablet (5 mg total) by mouth daily.Patient not taking: Reported on 04/26/2024   3. Are you having a reaction (difficulty breathing--STAT)? No   4. What is your medication issue? The patient stated that he is experiencing joint pain again with this medication and would like to speak with Melissa about switching to a different medication. "

## 2024-05-11 NOTE — Telephone Encounter (Signed)
 Patient reports pains in his arms and legs that started 3 days after starting rosuvastatin  and stopping 2-3 days after stopping therapy. He is interested in Repatha. Do not have recent baseline labs, therefore will get lipid panel prior to starting.

## 2024-05-12 ENCOUNTER — Telehealth: Payer: Self-pay | Admitting: Pharmacy Technician

## 2024-05-12 ENCOUNTER — Other Ambulatory Visit (HOSPITAL_COMMUNITY): Payer: Self-pay

## 2024-05-12 LAB — LIPID PANEL
Chol/HDL Ratio: 3 ratio (ref 0.0–5.0)
Cholesterol, Total: 170 mg/dL (ref 100–199)
HDL: 57 mg/dL
LDL Chol Calc (NIH): 91 mg/dL (ref 0–99)
Triglycerides: 126 mg/dL (ref 0–149)
VLDL Cholesterol Cal: 22 mg/dL (ref 5–40)

## 2024-05-12 NOTE — Telephone Encounter (Signed)
" ° °  Ran test claim for repatha. For a 28 day supply and the co-pay is 401.12 . PA is not needed at this time. This test claim was processed through [facility]- copay amounts may vary at other pharmacies due to pharmacy/plan contracts, or as the patient moves through the different stages of their insurance plan.      "

## 2024-05-12 NOTE — Telephone Encounter (Signed)
 Can you please do PA for Repatha?

## 2024-05-13 ENCOUNTER — Telehealth: Payer: Self-pay | Admitting: Pharmacy Technician

## 2024-05-13 ENCOUNTER — Other Ambulatory Visit (HOSPITAL_COMMUNITY): Payer: Self-pay

## 2024-05-13 MED ORDER — REPATHA SURECLICK 140 MG/ML ~~LOC~~ SOAJ: 1.0000 mL | SUBCUTANEOUS | 11 refills | Status: AC

## 2024-05-13 NOTE — Telephone Encounter (Signed)
 Can we please get him healthwell grant and call it into [facility]?

## 2024-05-13 NOTE — Telephone Encounter (Signed)
 Effective: 04/13/24 - 04/12/25   APW:[id number] ERW:[id number] Hmnle:[id number] PI:[id number]

## 2024-05-13 NOTE — Telephone Encounter (Signed)
 Patient Advocate Encounter   The patient was approved for a Healthwell grant that will help cover the cost of repatha  Total amount awarded, 2500.  Effective: 04/13/24 - 04/12/25   APW:[id number] ERW:[id number] Hmnle:[id number] PI:[id number] [employer]well ID: [id number]   Pharmacy provided with approval and processing information. Patient informed via mychart

## 2024-05-14 ENCOUNTER — Telehealth: Payer: Self-pay | Admitting: Cardiovascular Disease

## 2024-05-14 MED ORDER — AMLODIPINE BESYLATE 10 MG PO TABS: 10.0000 mg | ORAL_TABLET | Freq: Every day | ORAL | 3 refills | Status: AC

## 2024-05-14 NOTE — Addendum Note (Signed)
 Addended by: Vin Yonke D on: 05/14/2024 12:47 PM   Modules accepted: Orders

## 2024-05-14 NOTE — Telephone Encounter (Signed)
 Pt has been having severe muscle and joint pain. Pts wife believes it is due to one of his medications. She could not remember which medication. Please advise.

## 2024-05-14 NOTE — Telephone Encounter (Signed)
 Left message for patient to call back. Looks like patient recently started Repatha . Will await returned call from patient.

## 2024-05-14 NOTE — Telephone Encounter (Signed)
 I called patient to let patient know about the Repatha  grant. He had already picked up. I saw message about amlodipine  so I told patient Dr. Barbaraann wants him to increase amlodipine . He thinks hydralazine  may be causing his joints to ach to I advised him to stop and see if it improves and to report back to us .

## 2024-05-14 NOTE — Telephone Encounter (Signed)
 Pt c/o medication issue:   1. Name of Medication: hydrALAZINE  (APRESOLINE ) 25 MG tablet    2. How are you currently taking this medication (dosage and times per day)? Take 1 tablet (25 mg total) by mouth 3 (three) times daily.    3. Are you having a reaction (difficulty breathing--STAT)? No   4. What is your medication issue? Patient Spouse is calling back to provide the medication that is causing patient symptoms. She said it was the hydrALAZINE  (APRESOLINE ) 25 MG tablet  patient has not taking Repatha . She's requesting a call back instead since the patient don't answer the phone she's listed as DPR. Montie Moose [phone]). Please advise

## 2024-05-14 NOTE — Telephone Encounter (Signed)
 BP reading: 111/26 147/82 05/07/24: 142/81 05/08/24: 149/82 05/10/24: 136/77 05/11/24: 147/78 05/13/24: 141/79

## 2024-05-14 NOTE — Telephone Encounter (Signed)
 Spoke with [employer]. Kenneth Weber states pt has had increased muscle cramps and joint pain since leaving the hospital. Had a headache before but that is gone and replaced with the cramps.  Kenneth Weber stating she believes it is the hydralazine  that is causing the issues. Doesn't have any BP readings with her. She is currently at [facility] getting the new repatha  medication. Stated she would call back with BP readings. Advised I would go ahead and send message over to Dr Burton for review.

## 2024-05-23 ENCOUNTER — Encounter (HOSPITAL_BASED_OUTPATIENT_CLINIC_OR_DEPARTMENT_OTHER): Payer: Self-pay

## 2024-05-23 ENCOUNTER — Other Ambulatory Visit: Payer: Self-pay

## 2024-05-23 ENCOUNTER — Emergency Department (HOSPITAL_BASED_OUTPATIENT_CLINIC_OR_DEPARTMENT_OTHER): Admission: EM | Admit: 2024-05-23 | Discharge: 2024-05-23 | Disposition: A | Discharge status: Home / Self Care

## 2024-05-23 DIAGNOSIS — J449 Chronic obstructive pulmonary disease, unspecified: Secondary | ICD-10-CM | POA: Diagnosis not present

## 2024-05-23 DIAGNOSIS — I10 Essential (primary) hypertension: Secondary | ICD-10-CM | POA: Insufficient documentation

## 2024-05-23 DIAGNOSIS — M545 Low back pain, unspecified: Secondary | ICD-10-CM | POA: Diagnosis present

## 2024-05-23 MED ORDER — ACETAMINOPHEN 500 MG PO TABS
1000.0000 mg | ORAL_TABLET | Freq: Once | ORAL | Status: DC
  Filled 2024-05-23: qty 2

## 2024-05-23 MED ORDER — OXYCODONE-ACETAMINOPHEN 5-325 MG PO TABS: 1.0000 | ORAL_TABLET | Freq: Four times a day (QID) | ORAL | 0 refills | Status: DC | PRN

## 2024-05-23 MED ORDER — LIDOCAINE 5 % EX PTCH: 1.0000 | MEDICATED_PATCH | CUTANEOUS | 0 refills | Status: AC

## 2024-05-23 MED ORDER — OXYCODONE-ACETAMINOPHEN 5-325 MG PO TABS
2.0000 | ORAL_TABLET | Freq: Once | ORAL | Status: AC
  Administered 2024-05-23: 2 via ORAL
  Filled 2024-05-23: qty 2

## 2024-05-23 MED ORDER — LIDOCAINE 5 % EX PTCH
1.0000 | MEDICATED_PATCH | CUTANEOUS | Status: DC
  Administered 2024-05-23: 1 via TRANSDERMAL
  Filled 2024-05-23: qty 1

## 2024-05-23 NOTE — Discharge Instructions (Addendum)
 Back Pain:  Back pain is very common.  The pain often gets better over time.  The cause of back pain is usually not dangerous.  Most people can learn to manage their back pain on their own.  However if you develop severe or worsening pain, low back pain with fever, numbness, weakness or inability to walk or urinate/stool, you should return to the ER immediately.  As discussed, please follow up with your doctor this week for further evaluation.  Low back pain is discomfort in the lower back that may be due to injuries to muscles and ligaments around the spine.  Occasionally, it may be caused by a a problem to a part of the spine called a disc. The pain may last several days or a week;  However, most patients get completely well in 4 weeks.  Medications are also useful to help with pain control.  A commonly prescribed medications includes acetaminophen .  This medication is generally safe, though you should not take more than 8 of the extra strength (500mg ) pills a day.  I have also prescribed a short course of Percocet. This medication contains acetomenophen. Do not take at same time as Tylenol  (acetomenophen).  Be aware that if you develop new symptoms, such as a fever, leg weakness, difficulty with or loss of control of your urine or bowels, abdominal pain, or more severe pain, you will need to seek medical attention and  / or return to the Emergency department.    Home Care Stay active.  Start with short walks on flat ground if you can.  Try to walk farther each day. Do not sit, drive or stand in one place for more than 30 minutes.  Do not stay in bed. Do not avoid exercise or work.  Activity can help your back heal faster. Be careful when you bend or lift an object.  Bend at your knees, keep the object close to you, and do not twist. Sleep on a firm mattress.  Lie on your side, and bend your knees.  If you lie on your back, put a pillow under your knees. Only take medicines as told by your  doctor. Put ice on the injured area. Put ice in a plastic bag Place a towel between your skin and the bag Leave the ice on for 15-20 minutes, 3-4 times a day for the first 2-3 days. 210 After that, you can switch between ice and heat packs. Ask your doctor about back exercises or massage. Avoid feeling anxious or stressed.  Find good ways to deal with stress, such as exercise.  Get Help Right Way If: Your pain does not go away with rest or medicine. Your pain does not go away in 1 week. You have new problems. You do not feel well. The pain spreads into your legs. You cannot control when you poop (bowel movement) or pee (urinate) You feel sick to your stomach (nauseous) or throw up (vomit) You have belly (abdominal) pain. You feel like you may pass out (faint). If you develop a fever.  Make Sure you: Understand these instructions. Watch your condition Get help right away if you are not doing well or get worse.

## 2024-05-23 NOTE — ED Provider Notes (Signed)
 " [place] EMERGENCY DEPARTMENT AT [facility] Provider Note   CSN: [id number] Arrival date & time: 05/23/24  1236     Patient presents with: Back Pain   Kenneth Weber is a 74 y.o. male.  Past Medical History:  Diagnosis Date   Allergic rhinitis    Anemia    Arthritis    Chest pain    COPD (chronic obstructive pulmonary disease) (HCC)    ED (erectile dysfunction)    ED (erectile dysfunction) 06/19/2022   Hyperlipidemia    Hypertension    Pneumonia    SOB (shortness of breath)    Back Pain  42 male presented to the ED with right sided low back pain x 1 week.  Patient stated he woke up 1 week ago with sharp right sided lower back pain.  Initially pain was mild to moderate now patient classifies as severe. Pain exacerbated by standing/leg extension. Pt does not endorse any inciting factors or trauma. Pain does not radiate to leg or groin.  Patient has been rotating between ice and heat as well as taking Tylenol  with little relief.  Patient does not endorse any previous back surgeries or back conditions.  Denies recent surgeries, radiation to groin or legs, incontinence, saddle anesthesia, fevers, IVDU, decreased sensation.    Prior to Admission medications  Medication Sig Start Date End Date Taking? Authorizing Provider  lidocaine  (LIDODERM ) 5 % Place 1 patch onto the skin daily. Remove & Discard patch within 12 hours or as directed by MD 05/23/24  Yes Marylan Glore T, PA-C  oxyCODONE -acetaminophen  (PERCOCET/ROXICET) 5-325 MG tablet Take 1 tablet by mouth every 6 (six) hours as needed for up to 3 days for severe pain (pain score 7-10). 05/23/24 05/26/24 Yes Tobie Hellen T, PA-C  albuterol  (VENTOLIN  HFA) 108 (90 Base) MCG/ACT inhaler Up to 2 puffs every 4 hours as needed 04/26/24   Darlean Ozell NOVAK, MD  amLODipine  (NORVASC ) 10 MG tablet Take 1 tablet (10 mg total) by mouth daily. 05/14/24   O'NealDarryle Ned, MD  ascorbic acid (VITAMIN C) 500 MG tablet Take 500 mg by  mouth daily.    [provider]  diltiazem  (CARDIZEM  CD) 120 MG 24 hr capsule TAKE 1 CAPSULE BY MOUTH ONCE DAILY . APPOINTMENT REQUIRED FOR FUTURE REFILLS 04/16/24   Walker, Caitlin S, NP  Evolocumab  (REPATHA  SURECLICK) 140 MG/ML SOAJ Inject 140 mg into the skin every 14 (fourteen) days. 05/13/24   Maccia, Melissa D, RPH-CPP  hydrALAZINE  (APRESOLINE ) 25 MG tablet Take 1 tablet (25 mg total) by mouth 3 (three) times daily. 04/27/24   O'NealDarryle Ned, MD  latanoprost  (XALATAN ) 0.005 % ophthalmic solution Place 1 drop into both eyes at bedtime. Patient not taking: Reported on 04/26/2024 09/07/19   [provider]  rosuvastatin  (CRESTOR ) 5 MG tablet Take 1 tablet (5 mg total) by mouth daily. Patient not taking: Reported on 04/26/2024 03/29/24 06/27/24  Barbaraann Darryle Ned, MD  Tiotropium Bromide-Olodaterol (STIOLTO RESPIMAT ) 2.5-2.5 MCG/ACT AERS Inhale 2 puffs into the lungs daily. 04/26/24   Darlean Ozell NOVAK, MD    Allergies: Streptogramins, Codeine, Hyzaar [losartan potassium-hctz], and Zestril [lisinopril]    Review of Systems  Musculoskeletal:  Positive for back pain.    Updated Vital Signs BP 118/65 (BP Location: Right Arm)   Pulse 66   Temp 97.6 F (36.4 C) (Temporal)   Resp 18   SpO2 100%   Physical Exam Vitals and nursing note reviewed.  Constitutional:      General: He  is not in acute distress.    Appearance: He is well-developed.  HENT:     Head: Normocephalic and atraumatic.  Eyes:     Conjunctiva/sclera: Conjunctivae normal.  Cardiovascular:     Rate and Rhythm: Normal rate and regular rhythm.     Heart sounds: No murmur heard. Pulmonary:     Effort: Pulmonary effort is normal. No respiratory distress.     Breath sounds: Normal breath sounds.  Abdominal:     Palpations: Abdomen is soft.     Tenderness: There is no abdominal tenderness.  Musculoskeletal:        General: Tenderness present. No swelling.     Cervical back: Neck supple.      Comments: Tenderness to palpation in low right back at the L4-L5 level.  Skin:    General: Skin is warm and dry.     Capillary Refill: Capillary refill takes less than 2 seconds.     Findings: No bruising.  Neurological:     General: No focal deficit present.     Mental Status: He is alert and oriented to person, place, and time.     Sensory: No sensory deficit.     Motor: No weakness.  Psychiatric:        Mood and Affect: Mood normal.     (all labs ordered are listed, but only abnormal results are displayed) Labs Reviewed - No data to display  EKG: None  Radiology: No results found.   Procedures   Medications Ordered in the ED  lidocaine  (LIDODERM ) 5 % 1 patch (1 patch Transdermal Patch Applied 05/23/24 1447)  oxyCODONE -acetaminophen  (PERCOCET/ROXICET) 5-325 MG per tablet 2 tablet (2 tablets Oral Given 05/23/24 1447)                                    Medical Decision Making  This patient presents to the ED for concern of back pain, this involves a number of treatment options, and is a complaint that carries with it a moderate risk of complications and morbidity. A differential diagnosis was considered for the patient's symptoms which is discussed below:   Muscle strain vs sciatica vs cauda equina vs herniated disc vs epidural abscess   Co morbidities: Discussed in HPI   Brief History:  74 yo male with pmh scented to ED for right lower back pain. Patient described right back pain as sharp in nature exacerbated by standing and extending legs.    EMR reviewed including pt PMHx, past surgical history and past visits to ER.   See HPI for more details   Medicines ordered:  I ordered medication including lidocaine  patch and percocet for pain Reevaluation of the patient after these medicines showed that the patient improved I have reviewed the patients home medicines and have made adjustments as needed    Consults/Attending Physician   I discussed this case  with my attending physician who cosigned this note including patient's presenting symptoms, physical exam, and planned diagnostics and interventions. Attending physician stated agreement with plan or made changes to plan which were implemented.   Reevaluation:  After the interventions noted above I re-evaluated patient and found that they have :improved   Problem List / ED Course:  74 year old male presented to ED for right lower back pain. Pain started one week ago and since worsened. Patient now having difficulty standing without pain. Pain is exacerbated by right hip extension but does not  radiate to groin or leg. Patient does not appear acutely ill. Low concern for epidural abscess given no IVDU or recent procedures. Patient did not endorse any symptoms consistent with cauda equina.  Patient has not had any recent surgeries or procedures on his back.  No fever or erythema to indicate infection.  Pain likely musculoskeletal in cause potentially consistent a pinched nerve or herniated disc. Patient received lidocaine  patch and percocet for pain control in unit.  I believe patient is safe for discharge with pain control and follow up with ortho for PT further evaluation. Plan discussed with patient and patient agreed.   Dispostion:  After consideration of the diagnostic results and the patients response to treatment, I feel that the patent would benefit from discharge with pain medication and orthopedic follow up     Final diagnoses:  Acute right-sided low back pain without sciatica    ED Discharge Orders          Ordered    lidocaine  (LIDODERM ) 5 %  Every 24 hours        05/23/24 1546    oxyCODONE -acetaminophen  (PERCOCET/ROXICET) 5-325 MG tablet  Every 6 hours PRN        05/23/24 1546               Harold Tillman DASEN, PA-C 05/23/24 1621    Ula Prentice SAUNDERS, MD 05/31/24 1514  "

## 2024-05-23 NOTE — ED Triage Notes (Signed)
 Pt reports lower back pain x1 week. Pt denies any recent injury or trauma.

## 2024-05-24 ENCOUNTER — Ambulatory Visit: Admitting: Physical Medicine and Rehabilitation

## 2024-05-24 ENCOUNTER — Other Ambulatory Visit (INDEPENDENT_AMBULATORY_CARE_PROVIDER_SITE_OTHER)

## 2024-05-24 ENCOUNTER — Encounter: Payer: Self-pay | Admitting: Physical Medicine and Rehabilitation

## 2024-05-24 DIAGNOSIS — M7918 Myalgia, other site: Secondary | ICD-10-CM | POA: Diagnosis not present

## 2024-05-24 DIAGNOSIS — M5441 Lumbago with sciatica, right side: Secondary | ICD-10-CM

## 2024-05-24 DIAGNOSIS — M25551 Pain in right hip: Secondary | ICD-10-CM

## 2024-05-24 MED ORDER — PREDNISONE 50 MG PO TABS: 50.0000 mg | ORAL_TABLET | Freq: Every day | ORAL | 0 refills | Status: AC

## 2024-05-24 MED ORDER — METHOCARBAMOL 500 MG PO TABS: 500.0000 mg | ORAL_TABLET | Freq: Three times a day (TID) | ORAL | 0 refills | Status: AC

## 2024-05-24 MED ORDER — OXYCODONE-ACETAMINOPHEN 5-325 MG PO TABS: 1.0000 | ORAL_TABLET | Freq: Three times a day (TID) | ORAL | 0 refills | Status: AC | PRN

## 2024-05-24 NOTE — Progress Notes (Signed)
 Pain Scale   Average Pain 10 Patient advising he had a sudden onset of lower back pain that radiates to right hip area, patient advised pain started x 1 week age.        +Driver, -BT, -Dye Allergies.

## 2024-05-24 NOTE — Progress Notes (Signed)
 "  Kenneth Weber - 74 y.o. male MRN [id number]  Date of birth: 01-08-1951  Office Visit Note: Visit Date: 05/24/2024 PCP: Seabron Lenis, MD Referred by: Seabron Lenis, MD  Subjective: Chief Complaint  Patient presents with   Lower Back - Pain   HPI: Kenneth Weber is a 74 y.o. male who comes in today as a self referral for evaluation of acute right sided lower back pain radiating to lateral hip. He is friend of our practice administrator Sari May, is also a patient of Dr. Lonni Poli. Pain started on Monday January 12th. His pain started after work. He is currently working part time in software engineer. His pain worsens with activity, walking and prolonged sitting. He describes pain as sharp sensation, currently rates as 10 out of 10. Some relief of pain with home exercise regimen, rest and use of medications. He was evaluated in the emergency department on 05/23/2024. Some relief of pain with Lidocaine  patch and Percocet. He did become nauseated and throw up after taking Percocet. Feels he took this medication on empty stomach. He was prescribed Percocet but was unable to pick up last night. No prior imaging of lower back. No history of lumbar surgery/injections. Patient denies focal weakness, numbness and tingling. No recent trauma or falls.   He has seen Dr. Poli in the past for more left shoulder and hip pain. History of bilateral subacromial and bilateral greater trochanter injections with good relief of pain.   Patients course is complicated by atrial fibrillation, hypertension and COPD.      Review of Systems  Musculoskeletal:  Positive for back pain and myalgias.  Neurological:  Negative for tingling, sensory change, focal weakness and weakness.  All other systems reviewed and are negative.  Otherwise per HPI.  Assessment & Plan: Visit Diagnoses:    ICD-10-CM   1. Acute bilateral low back pain with right-sided sciatica  M54.41 XR Lumbar Spine 2-3 Views     2. Pain in right hip  M25.551     3. Myofascial pain syndrome  M79.18        Plan: Findings:  Acute right sided lower back pain radiating to lateral hip. Patient continues to have severe pain despite good conservative therapies such as home exercise regimen, rest and use of medications. I obtained lumbar radiographs in the office today that show multi level degenerative changes, no spondylolisthesis. Patients clinical presentation and exam are consistent with lumbar myofascial strain. There is myofascial tenderness noted to right lumbar paraspinal region upon exam today. I explained to patient that lumbar strain injuries can take 6-8 weeks to heal. We discussed treatment plan in detail today. I prescribed short course of oral prednisone , Robaxin  and Percocet. I also encouraged him to try topical Voltaren gel and heating pad. Would consider short course of formal physical therapy in the future with a focus on manual treatments and core strengthening. He did inquire about lumbar brace today. I will contact Aleck with [facility] to set up fitting for lumbar brace. Should his symptoms persist or present as radicular in nature would be quick to obtain lumbar MRI imaging. He has no questions at this time. His exam today is non focal, good strength noted to bilateral lower extremities.      Meds & Orders:  Meds ordered this encounter  Medications   predniSONE  (DELTASONE ) 50 MG tablet    Sig: Take 1 tablet (50 mg total) by mouth daily with breakfast. Take until completed.    Dispense:  5 tablet    Refill:  0   methocarbamol  (ROBAXIN ) 500 MG tablet    Sig: Take 1 tablet (500 mg total) by mouth 3 (three) times daily.    Dispense:  90 tablet    Refill:  0   oxyCODONE -acetaminophen  (PERCOCET/ROXICET) 5-325 MG tablet    Sig: Take 1 tablet by mouth every 8 (eight) hours as needed for severe pain (pain score 7-10).    Dispense:  30 tablet    Refill:  0    Orders Placed This Encounter  Procedures   XR  Lumbar Spine 2-3 Views    Follow-up: Return for 4 week follow up for re-evaluation.   Procedures: No procedures performed      Clinical History: No specialty comments available.   He reports that he quit smoking about 10 years ago. His smoking use included cigarettes. He started smoking about 50 years ago. He has a 60 pack-year smoking history. He has never used smokeless tobacco.  Recent Labs    02/13/24 0331  HGBA1C 5.4    Objective:  VS:  HT:    WT:   BMI:     BP:   HR: bpm  TEMP: ( )  RESP:  Physical Exam Vitals and nursing note reviewed.  HENT:     Head: Normocephalic and atraumatic.     Right Ear: External ear normal.     Left Ear: External ear normal.     Nose: Nose normal.     Mouth/Throat:     Mouth: Mucous membranes are moist.  Eyes:     Extraocular Movements: Extraocular movements intact.  Cardiovascular:     Rate and Rhythm: Normal rate.     Pulses: Normal pulses.  Pulmonary:     Effort: Pulmonary effort is normal.  Abdominal:     General: Abdomen is flat. There is no distension.  Musculoskeletal:        General: Tenderness present.     Cervical back: Normal range of motion.     Comments: Patient is slow to rise from seated position to standing. Forward flexed posture today likely due to pain. Good lumbar range of motion. No pain noted with facet loading. 5/5 strength noted with bilateral hip flexion, knee flexion/extension, ankle dorsiflexion/plantarflexion and EHL. No clonus noted bilaterally. No pain upon palpation of greater trochanters. No pain with internal/external rotation of bilateral hips. Sensation intact bilaterally. Myofascial tenderness upon palpation of right lumbar paraspinal region. Negative slump test bilaterally. Ambulates without aid, gait steady.     Skin:    General: Skin is warm and dry.     Capillary Refill: Capillary refill takes less than 2 seconds.  Neurological:     General: No focal deficit present.     Mental Status: He is  alert and oriented to person, place, and time.  Psychiatric:        Mood and Affect: Mood normal.        Behavior: Behavior normal.     Ortho Exam  Imaging: No results found.   Past Medical/Family/Surgical/Social History: Medications & Allergies reviewed per EMR, new medications updated. Patient Active Problem List   Diagnosis Date Noted   Hoarseness 04/26/2024   Hyperkalemia 02/12/2024   Dyslipidemia 02/12/2024   Lung nodule seen on imaging study 02/12/2024   Acute kidney injury 02/11/2024   ED (erectile dysfunction) 06/19/2022   PAF (paroxysmal atrial fibrillation) (HCC) 04/29/2022   Melena 04/29/2022   Atrial fibrillation with rapid ventricular response (HCC) 04/25/2022   Pneumonia  04/23/2022   Acute respiratory failure with hypoxia (HCC) 04/23/2022   Hypertension 04/23/2022   COPD with acute exacerbation (HCC) 04/23/2022   Bilateral lower extremity edema 04/23/2022   Former smoker 12/10/2021   COPD GOLD 3  10/10/2020   Past Medical History:  Diagnosis Date   Allergic rhinitis    Anemia    Arthritis    Chest pain    COPD (chronic obstructive pulmonary disease) (HCC)    ED (erectile dysfunction)    ED (erectile dysfunction) 06/19/2022   Hyperlipidemia    Hypertension    Pneumonia    SOB (shortness of breath)    Family History  Problem Relation Age of Onset   Hypertension Mother    Hypertension Father    CAD Father    Colon cancer Father    CAD Brother    Past Surgical History:  Procedure Laterality Date   COLONOSCOPY N/A 01/27/2024   Procedure: COLONOSCOPY;  Surgeon: Rosalie Kitchens, MD;  Location: [facility];  Service: Gastroenterology;  Laterality: N/A;  APC   COLONOSCOPY WITH PROPOFOL  N/A 07/24/2022   Procedure: COLONOSCOPY WITH PROPOFOL ;  Surgeon: Saintclair Jasper, MD;  Location: [facility];  Service: Gastroenterology;  Laterality: N/A;   ESOPHAGOGASTRODUODENOSCOPY N/A 04/30/2022   Procedure: ESOPHAGOGASTRODUODENOSCOPY (EGD);  Surgeon: Rosalie Kitchens, MD;   Location: [facility];  Service: Gastroenterology;  Laterality: N/A;   POLYPECTOMY  07/24/2022   Procedure: POLYPECTOMY;  Surgeon: Saintclair Jasper, MD;  Location: [facility];  Service: Gastroenterology;;   Social History   Occupational History   Not on file  Tobacco Use   Smoking status: Former    Current packs/day: 0.00    Average packs/day: 1.5 packs/day for 40.0 years (60.0 ttl pk-yrs)    Types: Cigarettes    Start date: 09/13/1973    Quit date: 09/13/2013    Years since quitting: 10.7   Smokeless tobacco: Never   Tobacco comments:    Patient reports cigars at times but trying to quit, 12/01/2020.   Vaping Use   Vaping status: Never Used  Substance and Sexual Activity   Alcohol use: No   Drug use: No   Sexual activity: Not on file   "

## 2024-05-26 ENCOUNTER — Telehealth: Payer: Self-pay | Admitting: *Deleted

## 2024-05-26 NOTE — Telephone Encounter (Signed)
 Copied from CRM [id number]. Topic: Clinical - Medication Question >> May 21, 2024  1:32 PM Dedra B wrote: Reason for CRM: Patient's wife, Montie, said patient's Tiotropium Bromide-Olodaterol (STIOLTO RESPIMAT ) 2.5-2.5 MCG/ACT AERS is over $100. She wants to know if there are any programs or grants available to help pay for it. Please call her [phone] >> May 26, 2024  3:45 PM Leila BROCKS wrote: Patient's spouse Montie [phone] is returning Mehtab Dolberry's call regarding patient STIOLTO RESPIMAT ) 2.5-2.5 MCG/ACT AERS. Per CAL, warm transferred to RN.   Duplicate.  See note from 1/21 at 3:55 pm

## 2024-05-26 NOTE — Telephone Encounter (Signed)
 Copied from CRM [id number]. Topic: Clinical - Medication Question >> May 21, 2024  1:32 PM Dedra B wrote: Reason for CRM: Patient's wife, Montie, said patient's Tiotropium Bromide-Olodaterol (STIOLTO RESPIMAT ) 2.5-2.5 MCG/ACT AERS is over $100. She wants to know if there are any programs or grants available to help pay for it. Please call her [phone]  ----------------------------------------------------------------------------------------------------------------------------------------------  ATC x1.  LVM to return call.

## 2024-05-26 NOTE — Telephone Encounter (Signed)
 Any alternatives or reason for high pricing

## 2024-05-26 NOTE — Telephone Encounter (Signed)
"   Patient's wife called back and he was getting patient assistance with [employer]  previously.  II told his wife I  printed the patient assistance forms and placed them up front for them to pick up.  I let her know that I would also place a Stiolto inhaler up front for them to pick up as well.    Stiolto inhaler sample and BI patient assistance forms placed up front for patient completion. "

## 2024-05-27 NOTE — Telephone Encounter (Signed)
 Patients wife came by to pick up sample per [facility]. ID presented and she is on [facility]

## 2024-05-28 ENCOUNTER — Other Ambulatory Visit (HOSPITAL_COMMUNITY): Payer: Self-pay

## 2024-06-02 ENCOUNTER — Encounter (INDEPENDENT_AMBULATORY_CARE_PROVIDER_SITE_OTHER): Payer: Self-pay | Admitting: Otolaryngology

## 2024-06-02 ENCOUNTER — Ambulatory Visit (INDEPENDENT_AMBULATORY_CARE_PROVIDER_SITE_OTHER): Admitting: Otolaryngology

## 2024-06-02 VITALS — BP 131/76 | HR 66 | Temp 98.1°F | Ht 73.0 in | Wt 194.0 lb

## 2024-06-02 DIAGNOSIS — K219 Gastro-esophageal reflux disease without esophagitis: Secondary | ICD-10-CM

## 2024-06-02 DIAGNOSIS — R490 Dysphonia: Secondary | ICD-10-CM

## 2024-06-02 MED ORDER — PANTOPRAZOLE SODIUM 40 MG PO TBEC: 40.0000 mg | DELAYED_RELEASE_TABLET | Freq: Two times a day (BID) | ORAL | 1 refills | Status: AC

## 2024-06-02 NOTE — Progress Notes (Signed)
 Reason for Consult: Hoarseness Referring Physician: Dr. [name] is an 74 y.o. male.  HPI: Patient was COPD and treated with inhalers for years.  He has some hoarseness for the last 3 months.  No upper respiratory infection at the time of onset.  Hoarseness does fluctuate.  No dysphagia or odynophagia.  He tried omeprazole for 2 weeks once a day.  He does not have any sore throat.  He has not had this problem previously.  He did have his inhaler changed with the idea it might be part of the problem.  Past Medical History:  Diagnosis Date   Allergic rhinitis    Anemia    Arthritis    Chest pain    COPD (chronic obstructive pulmonary disease) (HCC)    ED (erectile dysfunction)    ED (erectile dysfunction) 06/19/2022   Hyperlipidemia    Hypertension    Pneumonia    SOB (shortness of breath)     Past Surgical History:  Procedure Laterality Date   COLONOSCOPY N/A 01/27/2024   Procedure: COLONOSCOPY;  Surgeon: Rosalie Kitchens, MD;  Location: [facility];  Service: Gastroenterology;  Laterality: N/A;  APC   COLONOSCOPY WITH PROPOFOL  N/A 07/24/2022   Procedure: COLONOSCOPY WITH PROPOFOL ;  Surgeon: Saintclair Jasper, MD;  Location: [facility];  Service: Gastroenterology;  Laterality: N/A;   ESOPHAGOGASTRODUODENOSCOPY N/A 04/30/2022   Procedure: ESOPHAGOGASTRODUODENOSCOPY (EGD);  Surgeon: Rosalie Kitchens, MD;  Location[facility];  Service: Gastroenterology;  Laterality: N/A;   POLYPECTOMY  07/24/2022   Procedure: POLYPECTOMY;  Surgeon: Saintclair Jasper, MD;  Location: [facility];  Service: Gastroenterology;;    Family History  Problem Relation Age of Onset   Hypertension Mother    Hypertension Father    CAD Father    Colon cancer Father    CAD Brother     Social History:  reports that he quit smoking about 10 years ago. His smoking use included cigarettes. He started smoking about 50 years ago. He has a 60 pack-year smoking history. He has never used smokeless tobacco. He reports that  he does not drink alcohol and does not use drugs.  Allergies: Allergies[1]   No results found for this or any previous visit (from the past 48 hours).  No results found.  ROS Blood pressure 131/76, pulse 66, temperature 98.1 F (36.7 C), height 6' 1 (1.854 m), weight 194 lb (88 kg), SpO2 94%. Physical Exam Constitutional:      Appearance: Normal appearance.  HENT:     Head: Normocephalic and atraumatic.     Right Ear: Tympanic membrane is without lesions and middle ear aerated, ear canal and external ear normal.     Left Ear: Tympanic membrane is without lesions and middle ear aerated, ear canal and external ear normal.     Nose: Nose without deviation of septum.  Turbinates with mild hypertrophy, No significant swelling or masses.     Oral cavity/oropharynx: Mucous membranes are moist. No lesions or masses    Larynx: has a raspy voice. Mirror attempted without success    Eyes:     Extraocular Movements: Extraocular movements intact.     Conjunctiva/sclera: Conjunctivae normal.     Pupils: Pupils are equal, round, and reactive to light.  Cardiovascular:     Rate and Rhythm: Normal rate.  Pulmonary:     Effort: Pulmonary effort is normal.  Musculoskeletal:     Cervical back: Normal range of motion and neck supple. No rigidity.  Lymphadenopathy:     Cervical:  No cervical adenopathy or masses.salivary glands without lesions. .     Salivary glands- no mass or swelling Neurological:     Mental Status: He is alert. CN 2-12 intact. No nystagmus      Assessment/Plan: Hoarseness- we discussed FOE  for evaluation of the larynx and he prefers not right now. Most likely the cause is either reflux or inhalers or combination. He could have a lesion. He will try protonix  for 3 weeks then if still has hoarseness do fibroptic exam. He understands the issues with making sure to check larynx and will follow up in the 3 weeks  Norleen Notice 06/02/2024, 11:04 AM        [1]   Allergies Allergen Reactions   Streptogramins    Codeine Nausea And Vomiting   Hyzaar [Losartan Potassium-Hctz] Other (See Comments)    ED   Zestril [Lisinopril] Cough

## 2024-06-21 ENCOUNTER — Ambulatory Visit: Admitting: Physical Medicine and Rehabilitation

## 2024-06-23 ENCOUNTER — Ambulatory Visit (INDEPENDENT_AMBULATORY_CARE_PROVIDER_SITE_OTHER): Admitting: Otolaryngology

## 2024-07-12 ENCOUNTER — Ambulatory Visit: Admitting: Internal Medicine
